# Patient Record
Sex: Female | Born: 1950
Health system: Southern US, Community
[De-identification: ages and names within clinical notes are randomized; demographics above are authoritative.]

## PROBLEM LIST (undated history)

## (undated) DIAGNOSIS — Z9221 Personal history of antineoplastic chemotherapy: Secondary | ICD-10-CM

## (undated) DIAGNOSIS — M722 Plantar fascial fibromatosis: Secondary | ICD-10-CM

## (undated) DIAGNOSIS — R112 Nausea with vomiting, unspecified: Secondary | ICD-10-CM

## (undated) DIAGNOSIS — K219 Gastro-esophageal reflux disease without esophagitis: Secondary | ICD-10-CM

## (undated) DIAGNOSIS — N3281 Overactive bladder: Secondary | ICD-10-CM

## (undated) DIAGNOSIS — Z923 Personal history of irradiation: Secondary | ICD-10-CM

## (undated) DIAGNOSIS — Z9889 Other specified postprocedural states: Secondary | ICD-10-CM

## (undated) DIAGNOSIS — N393 Stress incontinence (female) (male): Secondary | ICD-10-CM

## (undated) DIAGNOSIS — N819 Female genital prolapse, unspecified: Secondary | ICD-10-CM

## (undated) DIAGNOSIS — Z960 Presence of urogenital implants: Secondary | ICD-10-CM

## (undated) DIAGNOSIS — C801 Malignant (primary) neoplasm, unspecified: Secondary | ICD-10-CM

## (undated) HISTORY — PX: HERNIA REPAIR: SHX51

## (undated) HISTORY — DX: Malignant (primary) neoplasm, unspecified: C80.1

## (undated) HISTORY — DX: Plantar fascial fibromatosis: M72.2

---

## 1971-09-21 HISTORY — PX: INGUINAL HERNIA REPAIR: SUR1180

## 1999-07-22 ENCOUNTER — Other Ambulatory Visit: Admission: RE | Admit: 1999-07-22 | Discharge: 1999-07-22 | Payer: Self-pay | Admitting: *Deleted

## 1999-10-06 ENCOUNTER — Other Ambulatory Visit: Admission: RE | Admit: 1999-10-06 | Discharge: 1999-10-06 | Payer: Self-pay | Admitting: *Deleted

## 2000-08-10 ENCOUNTER — Other Ambulatory Visit: Admission: RE | Admit: 2000-08-10 | Discharge: 2000-08-10 | Payer: Self-pay | Admitting: *Deleted

## 2001-09-15 ENCOUNTER — Other Ambulatory Visit: Admission: RE | Admit: 2001-09-15 | Discharge: 2001-09-15 | Payer: Self-pay | Admitting: *Deleted

## 2002-09-25 ENCOUNTER — Other Ambulatory Visit: Admission: RE | Admit: 2002-09-25 | Discharge: 2002-09-25 | Payer: Self-pay | Admitting: *Deleted

## 2013-03-01 ENCOUNTER — Other Ambulatory Visit: Payer: Self-pay | Admitting: Family Medicine

## 2013-03-01 ENCOUNTER — Other Ambulatory Visit (HOSPITAL_COMMUNITY)
Admission: RE | Admit: 2013-03-01 | Discharge: 2013-03-01 | Disposition: A | Discharge status: Home / Self Care | Payer: 59 | Source: Ambulatory Visit | Attending: Family Medicine | Admitting: Family Medicine

## 2013-03-01 DIAGNOSIS — Z124 Encounter for screening for malignant neoplasm of cervix: Secondary | ICD-10-CM | POA: Insufficient documentation

## 2017-10-10 DIAGNOSIS — K219 Gastro-esophageal reflux disease without esophagitis: Secondary | ICD-10-CM | POA: Insufficient documentation

## 2019-07-22 DIAGNOSIS — Z170 Estrogen receptor positive status [ER+]: Secondary | ICD-10-CM

## 2019-07-22 HISTORY — DX: Estrogen receptor positive status (ER+): Z17.0

## 2019-08-08 ENCOUNTER — Other Ambulatory Visit: Payer: Self-pay | Admitting: Radiology

## 2019-08-10 ENCOUNTER — Telehealth: Payer: Self-pay | Admitting: Oncology

## 2019-08-10 NOTE — Telephone Encounter (Signed)
Spoke with patient to confirm afternoon [facility] appointment on 12/2, packet emailed to patient

## 2019-08-17 ENCOUNTER — Other Ambulatory Visit: Payer: Self-pay | Admitting: *Deleted

## 2019-08-17 DIAGNOSIS — C50211 Malignant neoplasm of upper-inner quadrant of right female breast: Secondary | ICD-10-CM | POA: Insufficient documentation

## 2019-08-17 DIAGNOSIS — Z170 Estrogen receptor positive status [ER+]: Secondary | ICD-10-CM | POA: Insufficient documentation

## 2019-08-21 NOTE — Progress Notes (Signed)
[facility] CONSULT NOTE  Patient Care Team: Patient, No Pcp Per as PCP - General (General Practice) Martini, Keisha N, RN as Oncology Nurse Navigator Stuart, Dawn C, RN as Oncology Nurse Navigator Gudena, Vinay, MD as Consulting Physician (Hematology and Oncology) Moody, John, MD as Consulting Physician (Radiation Oncology) [name], Faera, MD as Consulting Physician (General Surgery) Moody, John, MD as Consulting Physician (Radiation Oncology)  CHIEF COMPLAINTS/PURPOSE OF CONSULTATION:  Newly diagnosed breast cancer  HISTORY OF PRESENTING ILLNESS:  Amy Harrington 68 y.o. female is here because of recent diagnosis of invasive mammary carcinoma of the right breast. The cancer was detected on a routine screening mammogram on 07/17/19 that showed a 0.8cm indeterminate mass in the right breast. US on 07/26/19 showed a 0.9cm mass in the right breast highly suggestive of malignancy. Biopsy on 08/08/19 showed invasive mammary carcinoma, grade 2, HER-2 positive by FISH, ER+ 100%, PR+ 70%, Ki67 15%. She presents to the clinic today for initial evaluation and discussion of treatment options.   I reviewed her records extensively and collaborated the history with the patient.  SUMMARY OF ONCOLOGIC HISTORY: Oncology History  Malignant neoplasm of 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, estrogen receptor positive (HCC)  08/17/2019 Initial Diagnosis   Routine screening mammogram detected a 0.8cm indeterminate mass in the right breast. Biopsy showed invasive ductal carcinoma, grade 2, HER-2 + by FISH, ER+ 100%, PR+ 70%, Ki67 15%.    08/22/2019 Cancer Staging   Staging form: Breast, AJCC 8th Edition - Clinical stage from 08/22/2019: Stage IA (cT1b, cN0, cM0, G2, ER+, PR+, HER2+) - Signed by Gudena, Vinay, MD on 08/22/2019     MEDICAL HISTORY:  Past Medical History:  Diagnosis Date  . Plantar fasciitis     SURGICAL HISTORY: No prior surgeries SOCIAL HISTORY: Social History    Socioeconomic History  . Marital status: Married    Spouse name: Not on file  . Number of children: Not on file  . Years of education: Not on file  . Highest education level: Not on file  Occupational History  . Not on file  Social Needs  . Financial resource strain: Not on file  . Food insecurity    Worry: Not on file    Inability: Not on file  . Transportation needs    Medical: Not on file    Non-medical: Not on file  Tobacco Use  . Smoking status: Never Smoker  Substance and Sexual Activity  . Alcohol use: Not Currently  . Drug use: Never  . Sexual activity: Not on file  Lifestyle  . Physical activity    Days per week: Not on file    Minutes per session: Not on file  . Stress: Not on file  Relationships  . Social connections    Talks on phone: Not on file    Gets together: Not on file    Attends religious service: Not on file    Active member of club or organization: Not on file    Attends meetings of clubs or organizations: Not on file    Relationship status: Not on file  . Intimate partner violence    Fear of current or ex partner: Not on file    Emotionally abused: Not on file    Physically abused: Not on file    Forced sexual activity: Not on file  Other Topics Concern  . Not on file  Social History Narrative  . Not on file    FAMILY HISTORY: Family   History  Problem Relation Age of Onset  . Stomach cancer Father 49    ALLERGIES:  has No Known Allergies.  MEDICATIONS:  Current Outpatient Medications  Medication Sig Dispense Refill  . esomeprazole (NEXIUM) 20 MG capsule Take 20 mg by mouth daily at 12 noon.    . Multiple Vitamin (MULTIVITAMIN) tablet Take 1 tablet by mouth daily.     No current facility-administered medications for this visit.     REVIEW OF SYSTEMS:   Constitutional: Denies fevers, chills or abnormal night sweats Eyes: Denies blurriness of vision, double vision or watery eyes Ears, nose, mouth, throat, and face: Denies mucositis  or sore throat Respiratory: Denies cough, dyspnea or wheezes Cardiovascular: Denies palpitation, chest discomfort or lower extremity swelling Gastrointestinal:  Denies nausea, heartburn or change in bowel habits Skin: Denies abnormal skin rashes Lymphatics: Denies new lymphadenopathy or easy bruising Neurological:Denies numbness, tingling or new weaknesses Behavioral/Psych: Mood is stable, no new changes  Breast: Denies any palpable lumps or discharge All other systems were reviewed with the patient and are negative.  PHYSICAL EXAMINATION: ECOG PERFORMANCE STATUS: 1 - Symptomatic but completely ambulatory  Vitals:   08/22/19 0855  BP: (!) 132/54  Pulse: 73  Resp: 18  Temp: (!) 97.1 F (36.2 C)  SpO2: 99%   Filed Weights   08/22/19 0855  Weight: 188 lb 8 oz (85.5 kg)    GENERAL:alert, no distress and comfortable SKIN: skin color, texture, turgor are normal, no rashes or significant lesions EYES: normal, conjunctiva are pink and non-injected, sclera clear OROPHARYNX:no exudate, no erythema and lips, buccal mucosa, and tongue normal  NECK: supple, thyroid normal size, non-tender, without nodularity LYMPH:  no palpable lymphadenopathy in the cervical, axillary or inguinal LUNGS: clear to auscultation and percussion with normal breathing effort HEART: regular rate & rhythm and no murmurs and no lower extremity edema ABDOMEN:abdomen soft, non-tender and normal bowel sounds Musculoskeletal:no cyanosis of digits and no clubbing  PSYCH: alert & oriented x 3 with fluent speech NEURO: no focal motor/sensory deficits BREAST: No palpable nodules in breast. No palpable axillary or supraclavicular lymphadenopathy (exam performed in the presence of a chaperone)   LABORATORY DATA:  I have reviewed the data as listed Lab Results  Component Value Date   WBC 5.8 08/22/2019   HGB 14.9 08/22/2019   HCT 44.9 08/22/2019   MCV 91.8 08/22/2019   PLT 208 08/22/2019   Lab Results  Component  Value Date   NA 143 08/22/2019   K 4.3 08/22/2019   CL 109 08/22/2019   CO2 24 08/22/2019    RADIOGRAPHIC STUDIES: I have personally reviewed the radiological reports and agreed with the findings in the report.  ASSESSMENT AND PLAN:  Malignant neoplasm of 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, estrogen receptor positive (HCC) 08/17/2019:Routine screening mammogram detected a 0.8cm indeterminate mass in the right breast. Biopsy showed invasive ductal carcinoma, grade 2, HER-2 + by FISH, ER+ 100%, PR+ 70%, Ki67 15%.  T1 BN 0 stage Ia clinical stage  Pathology and radiology counseling: Discussed with the patient, the details of pathology including the type of breast cancer,the clinical staging, the significance of ER, PR and HER-2/neu receptors and the implications for treatment. After reviewing the pathology in detail, we proceeded to discuss the different treatment options between surgery, radiation, chemotherapy, antiestrogen therapies.  Treatment plan: 1.  Breast conserving surgery with sentinel lymph node biopsy 2.  Adjuvant chemotherapy with Taxol [name] weekly x12 followed by [name] maintenance for 1 year 3.    Follow-up adjuvant radiation 4.  Follow-up adjuvant antiestrogen therapy  Chemotherapy Counseling: I discussed the risks and benefits of chemotherapy including the risks of nausea/ vomiting, risk of infection from low WBC count, fatigue due to chemo or anemia, bruising or bleeding due to low platelets, mouth sores, loss/ change in taste and decreased appetite. Liver and kidney function will be monitored through out chemotherapy as abnormalities in liver and kidney function may be a side effect of treatment. Cardiac dysfunction due to  [name] and neuropathy due to Taxol were discussed in detail. Risk of permanent bone marrow dysfunction and leukemia due to chemo were also discussed.  Return to clinic after surgery to discuss starting chemotherapy.     All  questions were answered. The patient knows to call the clinic with any problems, questions or concerns.   Rulon Eisenmenger, MD, MPH 08/22/2019    I, Molly Dorshimer, am acting as scribe for Nicholas Lose, MD.  I have reviewed the above documentation for accuracy and completeness, and I agree with the above.

## 2019-08-22 ENCOUNTER — Inpatient Hospital Stay: Payer: Medicare Other

## 2019-08-22 ENCOUNTER — Encounter: Payer: Self-pay | Admitting: Physical Therapy

## 2019-08-22 ENCOUNTER — Encounter: Payer: Self-pay | Admitting: Hematology and Oncology

## 2019-08-22 ENCOUNTER — Other Ambulatory Visit: Payer: Self-pay

## 2019-08-22 ENCOUNTER — Inpatient Hospital Stay: Payer: Medicare Other | Attending: Hematology and Oncology | Admitting: Hematology and Oncology

## 2019-08-22 ENCOUNTER — Other Ambulatory Visit: Payer: Self-pay | Admitting: General Surgery

## 2019-08-22 ENCOUNTER — Ambulatory Visit: Payer: Medicare Other | Attending: General Surgery | Admitting: Physical Therapy

## 2019-08-22 ENCOUNTER — Ambulatory Visit
Admission: RE | Admit: 2019-08-22 | Discharge: 2019-08-22 | Disposition: A | Discharge status: Home / Self Care | Payer: Medicare Other | Source: Ambulatory Visit | Attending: Radiation Oncology | Admitting: Radiation Oncology

## 2019-08-22 VITALS — BP 132/54 | HR 73 | Temp 97.1°F | Resp 18 | Ht 63.0 in | Wt 188.5 lb

## 2019-08-22 DIAGNOSIS — Z23 Encounter for immunization: Secondary | ICD-10-CM | POA: Diagnosis not present

## 2019-08-22 DIAGNOSIS — Z170 Estrogen receptor positive status [ER+]: Secondary | ICD-10-CM

## 2019-08-22 DIAGNOSIS — C50211 Malignant neoplasm of upper-inner quadrant of right female breast: Secondary | ICD-10-CM | POA: Diagnosis present

## 2019-08-22 DIAGNOSIS — R293 Abnormal posture: Secondary | ICD-10-CM | POA: Insufficient documentation

## 2019-08-22 DIAGNOSIS — Z0000 Encounter for general adult medical examination without abnormal findings: Secondary | ICD-10-CM

## 2019-08-22 LAB — CBC WITH DIFFERENTIAL (CANCER CENTER ONLY)
Abs Immature Granulocytes: 0.02 10*3/uL (ref 0.00–0.07)
Basophils Absolute: 0 10*3/uL (ref 0.0–0.1)
Basophils Relative: 1 %
Eosinophils Absolute: 0.1 10*3/uL (ref 0.0–0.5)
Eosinophils Relative: 1 %
HCT: 44.9 % (ref 36.0–46.0)
Hemoglobin: 14.9 g/dL (ref 12.0–15.0)
Immature Granulocytes: 0 %
Lymphocytes Relative: 28 %
Lymphs Abs: 1.6 10*3/uL (ref 0.7–4.0)
MCH: 30.5 pg (ref 26.0–34.0)
MCHC: 33.2 g/dL (ref 30.0–36.0)
MCV: 91.8 fL (ref 80.0–100.0)
Monocytes Absolute: 0.4 10*3/uL (ref 0.1–1.0)
Monocytes Relative: 8 %
Neutro Abs: 3.6 10*3/uL (ref 1.7–7.7)
Neutrophils Relative %: 62 %
Platelet Count: 208 10*3/uL (ref 150–400)
RBC: 4.89 MIL/uL (ref 3.87–5.11)
RDW: 13.4 % (ref 11.5–15.5)
WBC Count: 5.8 10*3/uL (ref 4.0–10.5)
nRBC: 0 % (ref 0.0–0.2)

## 2019-08-22 LAB — CMP (CANCER CENTER ONLY)
ALT: 25 U/L (ref 0–44)
AST: 25 U/L (ref 15–41)
Albumin: 4 g/dL (ref 3.5–5.0)
Alkaline Phosphatase: 67 U/L (ref 38–126)
Anion gap: 10 (ref 5–15)
BUN: 13 mg/dL (ref 8–23)
CO2: 24 mmol/L (ref 22–32)
Calcium: 9.5 mg/dL (ref 8.9–10.3)
Chloride: 109 mmol/L (ref 98–111)
Creatinine: 0.78 mg/dL (ref 0.44–1.00)
GFR, Est AFR Am: >60 mL/min (ref >60)
GFR, Estimated: >60 mL/min (ref >60)
Glucose, Bld: 96 mg/dL (ref 70–99)
Potassium: 4.3 mmol/L (ref 3.5–5.1)
Sodium: 143 mmol/L (ref 135–145)
Total Bilirubin: 0.4 mg/dL (ref 0.3–1.2)
Total Protein: 6.6 g/dL (ref 6.5–8.1)

## 2019-08-22 LAB — GENETIC SCREENING ORDER

## 2019-08-22 MED ORDER — INFLUENZA VAC A&B SA ADJ QUAD 0.5 ML IM PRSY
PREFILLED_SYRINGE | INTRAMUSCULAR | Status: AC
  Filled 2019-08-22: qty 0.5

## 2019-08-22 MED ORDER — INFLUENZA VAC A&B SA ADJ QUAD 0.5 ML IM PRSY
0.5000 mL | PREFILLED_SYRINGE | Freq: Once | INTRAMUSCULAR | Status: AC
  Administered 2019-08-22: 0.5 mL via INTRAMUSCULAR

## 2019-08-22 NOTE — Patient Instructions (Signed)

## 2019-08-22 NOTE — Addendum Note (Signed)
Addended by: Rennis Harding on: 08/22/2019 11:55 AM   Modules accepted: Orders

## 2019-08-22 NOTE — Therapy (Signed)
[place], [place], [address] Phone: [phone]   Fax:  [phone]  Physical Therapy Evaluation  Patient Details  Name: Amy Harrington MRN: [id number] Date of Birth: Oct 09, 1950 Referring Provider (PT): Dr. Stark Klein   Encounter Date: 08/22/2019  PT End of Session - 08/22/19 1058    Visit Number  1    Number of Visits  2    Date for PT Re-Evaluation  10/17/19    PT Start Time  1025    PT Stop Time  7096   Also saw pt from 4383 to 1140 for a total of 27 minutes   PT Time Calculation (min)  15 min    Activity Tolerance  Patient tolerated treatment well    Behavior During Therapy  [facility] for tasks assessed/performed       Past Medical History:  Diagnosis Date  . Plantar fasciitis     Past Surgical History:  Procedure Laterality Date  . HERNIA REPAIR      There were no vitals filed for this visit.   Subjective Assessment - 08/22/19 1048    Subjective  Patient reports she is here today to be seen by her medical team for her newly diagnosed right breast cancer.    Patient is accompained by:  Family member    Pertinent History  Patient was diagnosed on 07/17/2019 with right grade II triple positive invasive ductal carcinoma breast cancer. It measures 8 mm and is located in the upper inner quadrant. The Ki67 is 15%.    Patient Stated Goals  Reduce lymphedema risk and learn post op shoulder ROM HEP    Currently in Pain?  No/denies         [facility] PT Assessment - 08/22/19 0001      Assessment   Medical Diagnosis  Right breast cancer    Referring Provider (PT)  Dr. Stark Klein    Onset Date/Surgical Date  07/17/19    Hand Dominance  Right    Prior Therapy  none      Precautions   Precautions  Other (comment)    Precaution Comments  active cancer      Restrictions   Weight Bearing Restrictions  No      Balance Screen   Has the patient fallen in the past 6 months  Yes    How many times?   1   Pt fell off of a bike when she was getting on   Has the patient had a decrease in activity level because of a fear of falling?   No    Is the patient reluctant to leave their home because of a fear of falling?   No      Home Environment   Living Environment  Private residence    Living Arrangements  Alone    Available Help at Discharge  Family      Prior Function   Level of Independence  Independent    Vocation  Full time employment    Vocation Requirements  Runs an Chief Financial Officer business; mostly desk work    Leisure  She does not exercise but tried to achieve 10,000 steps per day      Cognition   Overall Cognitive Status  Within Functional Limits for tasks assessed      Posture/Postural Control   Posture/Postural Control  Postural limitations    Postural Limitations  Rounded Shoulders;Forward head      ROM / Strength  AROM / PROM / Strength  AROM;Strength      AROM   Overall AROM Comments  Cervical AROM is WNL    AROM Assessment Site  Shoulder    Right/Left Shoulder  Right;Left    Right Shoulder Extension  51 Degrees    Right Shoulder Flexion  132 Degrees    Right Shoulder ABduction  138 Degrees    Right Shoulder Internal Rotation  59 Degrees    Right Shoulder External Rotation  76 Degrees    Left Shoulder Extension  53 Degrees    Left Shoulder Flexion  137 Degrees    Left Shoulder ABduction  130 Degrees    Left Shoulder Internal Rotation  53 Degrees    Left Shoulder External Rotation  87 Degrees      Strength   Overall Strength  Within functional limits for tasks performed        LYMPHEDEMA/ONCOLOGY QUESTIONNAIRE - 08/22/19 1055      Type   Cancer Type  Right breast cancer      Lymphedema Assessments   Lymphedema Assessments  Upper extremities      Right Upper Extremity Lymphedema   10 cm Proximal to Olecranon Process  27.7 cm    Olecranon Process  24.5 cm    10 cm Proximal to Ulnar Styloid Process  22.8 cm    Just Proximal to Ulnar Styloid  Process  15.8 cm    Across Hand at [employer]  18.5 cm    At [place] of 2nd Digit  6.6 cm      Left Upper Extremity Lymphedema   10 cm Proximal to Olecranon Process  28.9 cm    Olecranon Process  25.8 cm    10 cm Proximal to Ulnar Styloid Process  22.7 cm    Just Proximal to Ulnar Styloid Process  17 cm    Across Hand at [employer]  18.3 cm    At [place] of 2nd Digit  6.3 cm          Quick Dash - 08/22/19 0001    Open a tight or new jar  Mild difficulty    Do heavy household chores (wash walls, wash floors)  No difficulty    Carry a shopping bag or briefcase  No difficulty    Wash your back  No difficulty    Use a knife to cut food  No difficulty    Recreational activities in which you take some force or impact through your arm, shoulder, or hand (golf, hammering, tennis)  No difficulty    During the past week, to what extent has your arm, shoulder or hand problem interfered with your normal social activities with family, friends, neighbors, or groups?  Not at all    During the past week, to what extent has your arm, shoulder or hand problem limited your work or other regular daily activities  Not at all    Arm, shoulder, or hand pain.  None    Tingling (pins and needles) in your arm, shoulder, or hand  None    Difficulty Sleeping  No difficulty    DASH Score  2.27 %        Objective measurements completed on examination: See above findings.     Patient was instructed today in a home exercise program today for post op shoulder range of motion. These included active assist shoulder flexion in sitting, scapular retraction, wall walking with shoulder abduction, and hands behind head external rotation.  She was encouraged to do these twice a day, holding 3 seconds and repeating 5 times when permitted by her physician.         PT Education - 08/22/19 1056    Education Details  Lymphedema risk reduction and post op shoulder ROM HEP    Person(s) Educated   Patient;Child(ren)    Methods  Explanation;Demonstration;Handout    Comprehension  Returned demonstration;Verbalized understanding          PT Long Term Goals - 08/22/19 1150      PT LONG TERM GOAL #1   Title  Patient will demonstrate she has regained full shoulder ROM and function post operatively compared ot baselines.    Time  8    Period  Weeks    Status  New    Target Date  10/17/19      Breast Clinic Goals - 08/22/19 1149      Patient will be able to verbalize understanding of pertinent lymphedema risk reduction practices relevant to her diagnosis specifically related to skin care.   Time  1    Period  Days    Status  Achieved      Patient will be able to return demonstrate and/or verbalize understanding of the post-op home exercise program related to regaining shoulder range of motion.   Time  1    Period  Days    Status  Achieved      Patient will be able to verbalize understanding of the importance of attending the postoperative After Breast Cancer Class for further lymphedema risk reduction education and therapeutic exercise.   Time  1    Period  Days    Status  Achieved            Plan - 08/22/19 1059    Clinical Impression Statement  Patient was diagnosed on 07/17/2019 with right grade II triple positive invasive ductal carcinoma breast cancer. It measures 8 mm and is located in the upper inner quadrant. The Ki67 is 15%. Her multidisciplinary medical team met prior to her assessments to determine a recommended treatment plan. She is planning to have a right lumpectomy and sentinel node biopsy followed by chemotherapy, radiation, and anti-estogen therapy. She will benefit from a post op PT visit to reassess and determine needs.    Stability/Clinical Decision Making  Stable/Uncomplicated    Clinical Decision Making  Low    Rehab Potential  Excellent    PT Frequency  --   Eval and 1 f/u visit   PT Treatment/Interventions  ADLs/Self Care Home  Management;Therapeutic exercise;Patient/family education    PT Next Visit Plan  Will reassess 3-4 eeks post op to determine needs    PT Home Exercise Plan  Post op shoulder ROM HEP    Consulted and Agree with Plan of Care  Patient;Family member/caregiver    Family Member Consulted  daughter       Patient will benefit from skilled therapeutic intervention in order to improve the following deficits and impairments:  Postural dysfunction, Decreased range of motion, Decreased knowledge of precautions, Impaired UE functional use, Pain  Visit Diagnosis: Malignant neoplasm of 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, estrogen receptor positive ([place]) - Plan: PT plan of care cert/re-cert  Abnormal posture - Plan: PT plan of care cert/re-cert   Patient will follow up at outpatient cancer rehab 3-4 weeks following surgery.  If the patient requires physical therapy at that time, a specific plan will be dictated and sent to the referring  physician for approval. The patient was educated today on appropriate basic range of motion exercises to begin post operatively and the importance of attending the After Breast Cancer class following surgery.  Patient was educated today on lymphedema risk reduction practices as it pertains to recommendations that will benefit the patient immediately following surgery.  She verbalized good understanding.      Problem List Patient Active Problem List   Diagnosis Date Noted  . Malignant neoplasm of 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, estrogen receptor positive ([place]) 08/17/2019   Annia Friendly, PT 08/22/19 11:53 AM  [place], [place], [address] Phone: [phone]   Fax:  [phone]  Name: GRACEANNA THEISSEN MRN: [id number] Date of Birth: 02/12/1951

## 2019-08-22 NOTE — Progress Notes (Signed)
Radiation Oncology         [phone] ________________________________  Name: Amy Harrington        MRN: [id number]  Date of Service: 08/22/2019 DOB: 1951-04-26  ON:[id number], No Pcp Per  Stark Klein, MD     REFERRING PHYSICIAN: Stark Klein, MD   DIAGNOSIS: The encounter diagnosis was Malignant neoplasm of 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, estrogen receptor positive ([place]).   HISTORY OF PRESENT ILLNESS: Amy Harrington is a 68 y.o. female seen in the multidisciplinary breast clinic for a new diagnosis of right breast cancer. The patient was noted to have a screening detected mass in the right breast.  On mammography this was found to measure approximately 8 mm in the 3 o'clock position.  She proceeded with diagnostic imaging and ultrasound revealed a 9 mm mass at 3:00.  Her axilla was negative for adenopathy.  She underwent a biopsy on 08/08/2019 which revealed a grade 2 invasive ductal carcinoma, her tumor was triple positive with a Ki-67 of 15%.  She is seen today to discuss treatment recommendations for her cancer.   PREVIOUS RADIATION THERAPY: No   PAST MEDICAL HISTORY:  Past Medical History:  Diagnosis Date  . Plantar fasciitis        PAST SURGICAL HISTORY: Past Surgical History:  Procedure Laterality Date  . HERNIA REPAIR       FAMILY HISTORY:  Family History  Problem Relation Age of Onset  . Stomach cancer Father 44     SOCIAL HISTORY:  reports that she has never smoked. She does not have any smokeless tobacco history on file. She reports previous alcohol use. She reports that she does not use drugs.  The patient is widowed and lives in [place]. She is accompanied by her daughter Eustaquio Maize. They run an Warehouse manager company.   ALLERGIES: Patient has no known allergies.   MEDICATIONS:  Current Outpatient Medications  Medication Sig Dispense Refill  . esomeprazole (NEXIUM) 20 MG capsule Take 20 mg by mouth daily at 12 noon.    . Multiple  Vitamin (MULTIVITAMIN) tablet Take 1 tablet by mouth daily.     No current facility-administered medications for this encounter.      REVIEW OF SYSTEMS: On review of systems, the patient reports that she is doing well overall. She is worried about how she will appear with chemotherapy side effects to her 50 year old granddaughter who lost her mother at 63 unexpectedly. She denies any chest pain, shortness of breath, cough, fevers, chills, night sweats, unintended weight changes. She denies any bowel or bladder disturbances, and denies abdominal pain, nausea or vomiting. She denies any new musculoskeletal or joint aches or pains. A complete review of systems is obtained and is otherwise negative.     PHYSICAL EXAM:  Wt Readings from Last 3 Encounters:  08/22/19 188 lb 8 oz (85.5 kg)   Temp Readings from Last 3 Encounters:  08/22/19 (!) 97.1 F (36.2 C) (Temporal)   BP Readings from Last 3 Encounters:  08/22/19 (!) 132/54   Pulse Readings from Last 3 Encounters:  08/22/19 73    In general this is a well appearing caucasian female in no acute distress. She's alert and oriented x4 and appropriate throughout the examination. Cardiopulmonary assessment is negative for acute distress and she exhibits normal effort.  Bilateral breast exam is deferred.    ECOG = 0  0 - Asymptomatic (Fully active, able to carry on all predisease activities without restriction)  1 -  Symptomatic but completely ambulatory (Restricted in physically strenuous activity but ambulatory and able to carry out work of a light or sedentary nature. For example, light housework, office work)  2 - Symptomatic, <50% in bed during the day (Ambulatory and capable of all self care but unable to carry out any work activities. Up and about more than 50% of waking hours)  3 - Symptomatic, >50% in bed, but not bedbound (Capable of only limited self-care, confined to bed or chair 50% or more of waking hours)  4 - Bedbound  (Completely disabled. Cannot carry on any self-care. Totally confined to bed or chair)  5 - Death   Eustace Pen MM, Creech RH, Tormey DC, et al. [phone]). "Toxicity and response criteria of the [facility]". [place] Oncol. 5 (6): 649-55    LABORATORY DATA:  Lab Results  Component Value Date   WBC 5.8 08/22/2019   HGB 14.9 08/22/2019   HCT 44.9 08/22/2019   MCV 91.8 08/22/2019   PLT 208 08/22/2019   Lab Results  Component Value Date   NA 143 08/22/2019   K 4.3 08/22/2019   CL 109 08/22/2019   CO2 24 08/22/2019   Lab Results  Component Value Date   ALT 25 08/22/2019   AST 25 08/22/2019   ALKPHOS 67 08/22/2019   BILITOT 0.4 08/22/2019      RADIOGRAPHY: No results found.     IMPRESSION/PLAN: 1. Stage IA, cT1bN0M0 grade 2 triple positive invasive ductal carcinoma of the right breast. Dr. Lisbeth Renshaw discusses the pathology findings and reviews the nature of triple positive breast disease. The consensus from the breast conference includes breast conservation with lumpectomy with  sentinel node biopsy. She was offered adjuvant chemotherapy because of the HER2 amplification of her disease. She would also benefit from adjuvant radiotherapy to the right breast for curative intent as this would reduce the risks of local recurrence. She would also benefit from antiestrogen therapy as well. We discussed the risks, benefits, short, and long term effects of radiotherapy, and the patient is interested in proceeding. Dr. Lisbeth Renshaw discusses the delivery and logistics of radiotherapy and anticipates a course of 4-6 1/2 weeks of radiotherapy. We will see her back about 3 weeks after completing systemic chemotherapy to consider moving forward with radiotherapy. 2. Social work needs. The patient would benefit from social work consultation to discuss how to discuss her own diagnosis with her young granddaughter who has already experienced great loss of a parent. We will follow this  expectantly.   In a visit lasting 45 minutes, greater than 50% of the time was spent face to face discussing her case, and coordinating the patient's care.  The above documentation reflects my direct findings during this shared patient visit. Please see the separate note by Dr. Lisbeth Renshaw on this date for the remainder of the patient's plan of care.    Carola Rhine, PAC

## 2019-08-22 NOTE — Assessment & Plan Note (Addendum)
08/17/2019:Routine screening mammogram detected a 0.8cm indeterminate mass in the right breast. Biopsy showed invasive ductal carcinoma, grade 2, HER-2 + by FISH, ER+ 100%, PR+ 70%, Ki67 15%.  T1 BN 0 stage Ia clinical stage  Pathology and radiology counseling: Discussed with the patient, the details of pathology including the type of breast cancer,the clinical staging, the significance of ER, PR and HER-2/neu receptors and the implications for treatment. After reviewing the pathology in detail, we proceeded to discuss the different treatment options between surgery, radiation, chemotherapy, antiestrogen therapies.  Treatment plan: 1.  Breast conserving surgery with sentinel lymph node biopsy 2.  Adjuvant chemotherapy with Taxol [name] weekly x12 followed by [name] maintenance for 1 year 3.  Follow-up adjuvant radiation 4.  Follow-up adjuvant antiestrogen therapy  Chemotherapy Counseling: I discussed the risks and benefits of chemotherapy including the risks of nausea/ vomiting, risk of infection from low WBC count, fatigue due to chemo or anemia, bruising or bleeding due to low platelets, mouth sores, loss/ change in taste and decreased appetite. Liver and kidney function will be monitored through out chemotherapy as abnormalities in liver and kidney function may be a side effect of treatment. Cardiac dysfunction due to  [name] and neuropathy due to Taxol were discussed in detail. Risk of permanent bone marrow dysfunction and leukemia due to chemo were also discussed.  Return to clinic after surgery to discuss starting chemotherapy.   

## 2019-08-24 ENCOUNTER — Ambulatory Visit (HOSPITAL_COMMUNITY)
Admission: RE | Admit: 2019-08-24 | Discharge: 2019-08-24 | Disposition: A | Discharge status: Home / Self Care | Payer: Medicare Other | Source: Ambulatory Visit | Attending: Hematology and Oncology | Admitting: Hematology and Oncology

## 2019-08-24 ENCOUNTER — Other Ambulatory Visit: Payer: Self-pay

## 2019-08-24 ENCOUNTER — Other Ambulatory Visit: Payer: Self-pay | Admitting: General Surgery

## 2019-08-24 DIAGNOSIS — Z170 Estrogen receptor positive status [ER+]: Secondary | ICD-10-CM

## 2019-08-24 DIAGNOSIS — Z0189 Encounter for other specified special examinations: Secondary | ICD-10-CM

## 2019-08-24 DIAGNOSIS — C50211 Malignant neoplasm of upper-inner quadrant of right female breast: Secondary | ICD-10-CM | POA: Diagnosis not present

## 2019-08-24 DIAGNOSIS — Z79899 Other long term (current) drug therapy: Secondary | ICD-10-CM | POA: Diagnosis not present

## 2019-08-24 NOTE — Progress Notes (Signed)
  Echocardiogram 2D Echocardiogram has been performed.  Jannett Celestine 08/24/2019, 9:58 AM

## 2019-08-28 ENCOUNTER — Other Ambulatory Visit: Payer: Self-pay | Admitting: General Surgery

## 2019-08-28 DIAGNOSIS — C50211 Malignant neoplasm of upper-inner quadrant of right female breast: Secondary | ICD-10-CM

## 2019-08-28 DIAGNOSIS — Z170 Estrogen receptor positive status [ER+]: Secondary | ICD-10-CM

## 2019-08-29 ENCOUNTER — Telehealth: Payer: Self-pay | Admitting: Hematology and Oncology

## 2019-08-29 NOTE — H&P (Signed)
Amy Harrington Documented: 08/22/2019 7:38 AM Location: [facility] Patient #: [id number] DOB: 02/27/1951 Undefined / Language: Amy Harrington / Race: Refused to Report/Unreported Female   History of Present Illness Amy Klein MD; 08/22/2019 1:25 PM) The patient is a 68 year old female who presents with breast cancer. Pt is a 68 yo F who presents with new dx of right breast cancer 07/2019. She had a screening detected mass and subsequently underwent dx imaging. She was seen to have an 8 mm mass at 3 o'clock. This was evaluated with a core needle biopsy and was shown to be a grade 2 invasive ductal carcinoma, triple positive, ki 67 15%. She has no personal history of cancer, but her father had stomach cancer at age 19. She is a G2P2 with first child at age 86. She had menarche at age 35. She did not use HRT, but did use hormonal contraception for around 10 years.   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   Films are from [place]. reports and images reviewed.  pathology 08/08/2019 Breast, right, needle core biopsy, 3 o'clock, 13cmfn - INVASIVE MAMMARY CARCINOMA, GRADE II. Immunohistochemistry for E-Cadherin is strongly and diffusely positive consistent with invasive ductal carcinoma. Estrogen Receptor: 100%, POSITIVE, STRONG STAINING INTENSITY Progesterone Receptor: 70%, POSITIVE, STRONG STAINING INTENSITY Proliferation Marker Ki67: 15% GROUP 1: HER2 **POSITIVE**   labs 08/22/2019 CBC, CMET normal.     Past Surgical History Tawni Pummel, RN; 08/22/2019 7:38 AM) Ventral / Umbilical Hernia Surgery  Right.  Diagnostic Studies History Tawni Pummel, RN; 08/22/2019 7:38 AM) Colonoscopy  never Mammogram  within last year Pap Smear  >5 years ago  Medication History Tawni Pummel, RN; 08/22/2019 7:39 AM) Medications Reconciled  Social History Tawni Pummel, RN; 08/22/2019 7:38  AM) Caffeine use  Carbonated beverages. No alcohol use  No drug use  Tobacco use  Never smoker.  Family History Tawni Pummel, RN; 08/22/2019 7:38 AM) Cancer  Father. Migraine Headache  Brother.  Pregnancy / Birth History Tawni Pummel, RN; 08/22/2019 7:38 AM) Age at menarche  61 years. Age of menopause  67-50 Contraceptive History  Oral contraceptives. Gravida  2 Maternal age  16-25 Para  2 Regular periods   Other Problems Tawni Pummel, RN; 08/22/2019 7:38 AM) Breast Cancer  Gastroesophageal Reflux Disease     Review of Systems Tawni Pummel RN; 08/22/2019 7:38 AM) General Not Present- Appetite Loss, Chills, Fatigue, Fever, Night Sweats, Weight Gain and Weight Loss. Skin Not Present- Change in Wart/Mole, Dryness, Hives, Jaundice, New Lesions, Non-Healing Wounds, Rash and Ulcer. HEENT Present- Wears glasses/contact lenses. Not Present- Earache, Hearing Loss, Hoarseness, Nose Bleed, Oral Ulcers, Ringing in the Ears, Seasonal Allergies, Sinus Pain, Sore Throat, Visual Disturbances and Yellow Eyes. Respiratory Not Present- Bloody sputum, Chronic Cough, Difficulty Breathing, Snoring and Wheezing. Breast Not Present- Breast Mass, Breast Pain, Nipple Discharge and Skin Changes. Cardiovascular Not Present- Chest Pain, Difficulty Breathing Lying Down, Leg Cramps, Palpitations, Rapid Heart Rate, Shortness of Breath and Swelling of Extremities. Gastrointestinal Present- Nausea. Not Present- Abdominal Pain, Bloating, Bloody Stool, Change in Bowel Habits, Chronic diarrhea, Constipation, Difficulty Swallowing, Excessive gas, Gets full quickly at meals, Hemorrhoids, Indigestion, Rectal Pain and Vomiting. Female Genitourinary Not Present- Frequency, Nocturia, Painful Urination, Pelvic Pain and Urgency. Musculoskeletal Not Present- Back Pain, Joint Pain, Joint Stiffness, Muscle Pain, Muscle Weakness and Swelling of Extremities. Neurological Present- Headaches. Not Present-  Decreased Memory, Fainting, Numbness, Seizures, Tingling, Tremor, Trouble  walking and Weakness. Psychiatric Present- Depression. Not Present- Anxiety, Bipolar, Change in Sleep Pattern, Fearful and Frequent crying. Endocrine Not Present- Cold Intolerance, Excessive Hunger, Hair Changes, Heat Intolerance, Hot flashes and New Diabetes. Hematology Not Present- Blood Thinners, Easy Bruising, Excessive bleeding, Gland problems, HIV and Persistent Infections.  Vitals Amy Klein MD; 08/22/2019 1:12 PM) 08/22/2019 1:11 PM Weight: 188.5 lb Height: 63in Body Surface Area: 1.89 m Body Mass Index: 33.39 kg/m  Temp.: 97.79F  Pulse: 73 (Regular)  Resp.: 18 (Unlabored)  BP: 132/54 (Sitting, Left Arm, Standard)       Physical Exam Amy Klein MD; 08/22/2019 1:23 PM) General Mental Status-Alert. General Appearance-Consistent with stated age. Hydration-Well hydrated. Voice-Normal.  Head and Neck Head-normocephalic, atraumatic with no lesions or palpable masses. Trachea-midline. Thyroid Gland Characteristics - normal size and consistency.  Eye Eyeball - Bilateral-Extraocular movements intact. Sclera/Conjunctiva - Bilateral-No scleral icterus.  Chest and Lung Exam Chest and lung exam reveals -quiet, even and easy respiratory effort with no use of accessory muscles and on auscultation, normal breath sounds, no adventitious sounds and normal vocal resonance. Inspection Chest Wall - Normal. Back - normal.  Breast Note: breasts have grade 3 ptosis. they are relatively even. There is bruising that is healing on the left breast at the biopsy site. no palpable masses in either breast or LAD. She has no nipple retraction or skin dimpling.   Cardiovascular Cardiovascular examination reveals -normal heart sounds, regular rate and rhythm with no murmurs and normal pedal pulses bilaterally.  Abdomen Inspection Inspection of the abdomen reveals - No  Hernias. Palpation/Percussion Palpation and Percussion of the abdomen reveal - Soft, Non Tender, No Rebound tenderness, No Rigidity (guarding) and No hepatosplenomegaly. Auscultation Auscultation of the abdomen reveals - Bowel sounds normal.  Neurologic Neurologic evaluation reveals -alert and oriented x 3 with no impairment of recent or remote memory. Mental Status-Normal.  Musculoskeletal Global Assessment -Note: no gross deformities.  Normal Exam - Left-Upper Extremity Strength Normal and Lower Extremity Strength Normal. Normal Exam - Right-Upper Extremity Strength Normal and Lower Extremity Strength Normal.  Lymphatic Head & Neck  General Head & Neck Lymphatics: Bilateral - Description - Normal. Axillary  General Axillary Region: Bilateral - Description - Normal. Tenderness - Non Tender. Femoral & Inguinal  Generalized Femoral & Inguinal Lymphatics: Bilateral - Description - No Generalized lymphadenopathy.    Assessment & Plan Amy Klein MD; 08/22/2019 1:24 PM) MALIGNANT NEOPLASM OF UPPER-INNER QUADRANT OF RIGHT BREAST IN FEMALE, ESTROGEN RECEPTOR POSITIVE (C50.211) Impression: Pt has a new dx of right cT1bN0 cancer. This is triple positive, so she will need chemotherapy. We will plan a seed localized lumpectomy wtih sentinel lymph node biopsy and a port. This will be followed by radiation and antiestrogen tx.  The surgical procedure was described to the patient. I discussed the incision type and location and that we would need radiology involved on with a wire or seed marker and/or sentinel node.  The risks and benefits of the procedure were described to the patient and she wishes to proceed.  We discussed the risks bleeding, infection, damage to other structures, need for further procedures/surgeries. We discussed the risk of seroma. The patient was advised if the area in the breast in cancer, we may need to go back to surgery for additional tissue to obtain  negative margins or for a lymph node biopsy. The patient was advised that these are the most common complications, but that others can occur as well. They were advised against taking aspirin or other  anti-inflammatory agents/blood thinners the week before surgery. Current Plans You are being scheduled for surgery- Our schedulers will call you.  You should hear from our office's scheduling department within 5 working days about the location, date, and time of surgery. We try to make accommodations for patient's preferences in scheduling surgery, but sometimes the OR schedule or the surgeon's schedule prevents [place] from making those accommodations.  If you have not heard from our office [phone]) in 5 working days, call the office and ask for your surgeon's nurse.  If you have other questions about your diagnosis, plan, or surgery, call the office and ask for your surgeon's nurse.  Pt Education - flb breast cancer surgery: discussed with patient and provided information.   Signed electronically by Amy Klein, MD (08/22/2019 1:26 PM)

## 2019-08-29 NOTE — Telephone Encounter (Signed)
Scheduled appt per 12/8 sch message - unable to reach pt . Left message with appt date and time   

## 2019-08-30 ENCOUNTER — Telehealth: Payer: Self-pay | Admitting: *Deleted

## 2019-08-30 ENCOUNTER — Encounter: Payer: Self-pay | Admitting: *Deleted

## 2019-08-30 NOTE — Telephone Encounter (Signed)
Spoke with patient from [facility] to assess navigation needs.  No needs or concerns at this time.  Patient is aware of all her appointments. Encouraged her to call if anything arises.

## 2019-08-31 ENCOUNTER — Telehealth: Payer: Self-pay | Admitting: Hematology and Oncology

## 2019-08-31 NOTE — Telephone Encounter (Signed)
Scheduled appt per 12/10 sch message - pt is aware of appt date and time   

## 2019-09-03 ENCOUNTER — Other Ambulatory Visit: Payer: Self-pay

## 2019-09-03 ENCOUNTER — Encounter (HOSPITAL_BASED_OUTPATIENT_CLINIC_OR_DEPARTMENT_OTHER): Payer: Self-pay | Admitting: General Surgery

## 2019-09-04 ENCOUNTER — Other Ambulatory Visit (HOSPITAL_COMMUNITY)
Admission: RE | Admit: 2019-09-04 | Discharge: 2019-09-04 | Disposition: A | Discharge status: Home / Self Care | Payer: Medicare Other | Source: Ambulatory Visit | Attending: General Surgery | Admitting: General Surgery

## 2019-09-04 DIAGNOSIS — Z01812 Encounter for preprocedural laboratory examination: Secondary | ICD-10-CM | POA: Insufficient documentation

## 2019-09-04 DIAGNOSIS — Z20828 Contact with and (suspected) exposure to other viral communicable diseases: Secondary | ICD-10-CM | POA: Diagnosis not present

## 2019-09-04 NOTE — Progress Notes (Signed)

## 2019-09-05 ENCOUNTER — Telehealth: Payer: Self-pay

## 2019-09-05 LAB — NOVEL CORONAVIRUS, NAA (HOSP ORDER, SEND-OUT TO REF LAB; TAT 18-24 HRS): SARS-CoV-2, NAA: NOT DETECTED

## 2019-09-05 NOTE — Telephone Encounter (Signed)
Nutrition Assessment  Reason for Assessment:  Pt attended Breast Clinic on 08/22/2019 and was given nutrition packet by nurse navigator.  ASSESSMENT:  68 year old female with right breast cancer.  Planning lumpectomy on 12/18, followed by adjuvant chemotherapy, radiation and antiestrogens.  Past medical history reviewed.   Spoke with patient via phone to introduce self and service at [facility].  Patient reports that appetite is normal.   Medications:  reviewed  Labs: MVI  Anthropometrics:   Height: 63 inches Weight: 188 lb BMI: 33   NUTRITION DIAGNOSIS: Food and nutrition related knowledge deficit related to new diagnosis of breast cancer as evidenced by no prior need for nutrition related information.  INTERVENTION:   Discussed briefly packet of information regarding nutritional tips for breast cancer patients.  Questions answered. Contact information provided and patient knows to contact me with questions/concerns.    MONITORING, EVALUATION, and GOAL: Pt will consume a healthy plant based diet to maintain lean body mass throughout treatment.   Yahshua Thibault B. Zenia Resides, [place], [place] Registered Dietitian [phone] (pager)

## 2019-09-06 ENCOUNTER — Ambulatory Visit
Admission: RE | Admit: 2019-09-06 | Discharge: 2019-09-06 | Disposition: A | Discharge status: Home / Self Care | Payer: Medicare Other | Source: Ambulatory Visit | Attending: General Surgery | Admitting: General Surgery

## 2019-09-06 ENCOUNTER — Other Ambulatory Visit: Payer: Self-pay

## 2019-09-06 DIAGNOSIS — Z170 Estrogen receptor positive status [ER+]: Secondary | ICD-10-CM

## 2019-09-06 DIAGNOSIS — C50211 Malignant neoplasm of upper-inner quadrant of right female breast: Secondary | ICD-10-CM

## 2019-09-07 ENCOUNTER — Encounter (HOSPITAL_BASED_OUTPATIENT_CLINIC_OR_DEPARTMENT_OTHER)
Admission: RE | Disposition: A | Discharge status: Home / Self Care | Payer: Self-pay | Source: Home / Self Care | Attending: General Surgery

## 2019-09-07 ENCOUNTER — Ambulatory Visit (HOSPITAL_COMMUNITY)
Admission: RE | Admit: 2019-09-07 | Discharge: 2019-09-07 | Disposition: A | Discharge status: Home / Self Care | Payer: Medicare Other | Source: Ambulatory Visit | Attending: General Surgery | Admitting: General Surgery

## 2019-09-07 ENCOUNTER — Ambulatory Visit (HOSPITAL_BASED_OUTPATIENT_CLINIC_OR_DEPARTMENT_OTHER): Payer: Medicare Other | Admitting: Anesthesiology

## 2019-09-07 ENCOUNTER — Encounter (HOSPITAL_BASED_OUTPATIENT_CLINIC_OR_DEPARTMENT_OTHER): Payer: Self-pay | Admitting: General Surgery

## 2019-09-07 ENCOUNTER — Ambulatory Visit (HOSPITAL_COMMUNITY): Payer: Medicare Other

## 2019-09-07 ENCOUNTER — Ambulatory Visit
Admission: RE | Admit: 2019-09-07 | Discharge: 2019-09-07 | Disposition: A | Discharge status: Home / Self Care | Payer: Medicare Other | Source: Ambulatory Visit | Attending: General Surgery | Admitting: General Surgery

## 2019-09-07 ENCOUNTER — Other Ambulatory Visit: Payer: Self-pay

## 2019-09-07 ENCOUNTER — Ambulatory Visit (HOSPITAL_BASED_OUTPATIENT_CLINIC_OR_DEPARTMENT_OTHER)
Admission: RE | Admit: 2019-09-07 | Discharge: 2019-09-07 | Disposition: A | Discharge status: Home / Self Care | Payer: Medicare Other | Attending: General Surgery | Admitting: General Surgery

## 2019-09-07 DIAGNOSIS — Z6833 Body mass index (BMI) 33.0-33.9, adult: Secondary | ICD-10-CM | POA: Insufficient documentation

## 2019-09-07 DIAGNOSIS — Z95828 Presence of other vascular implants and grafts: Secondary | ICD-10-CM

## 2019-09-07 DIAGNOSIS — Z170 Estrogen receptor positive status [ER+]: Secondary | ICD-10-CM | POA: Diagnosis not present

## 2019-09-07 DIAGNOSIS — C50211 Malignant neoplasm of upper-inner quadrant of right female breast: Secondary | ICD-10-CM

## 2019-09-07 DIAGNOSIS — K219 Gastro-esophageal reflux disease without esophagitis: Secondary | ICD-10-CM | POA: Insufficient documentation

## 2019-09-07 DIAGNOSIS — E669 Obesity, unspecified: Secondary | ICD-10-CM | POA: Insufficient documentation

## 2019-09-07 HISTORY — PX: PORTACATH PLACEMENT: SHX2246

## 2019-09-07 HISTORY — DX: Other specified postprocedural states: R11.2

## 2019-09-07 HISTORY — PX: BREAST LUMPECTOMY WITH RADIOACTIVE SEED AND SENTINEL LYMPH NODE BIOPSY: SHX6550

## 2019-09-07 HISTORY — DX: Other specified postprocedural states: Z98.890

## 2019-09-07 HISTORY — DX: Gastro-esophageal reflux disease without esophagitis: K21.9

## 2019-09-07 SURGERY — BREAST LUMPECTOMY WITH RADIOACTIVE SEED AND SENTINEL LYMPH NODE BIOPSY
Anesthesia: General | Site: Chest | Laterality: Right

## 2019-09-07 MED ORDER — CEFAZOLIN SODIUM-DEXTROSE 2-4 GM/100ML-% IV SOLN
INTRAVENOUS | Status: AC
  Filled 2019-09-07: qty 100

## 2019-09-07 MED ORDER — OXYCODONE HCL 5 MG/5ML PO SOLN: 5.0000 mg | Freq: Once | ORAL | Status: DC | PRN

## 2019-09-07 MED ORDER — SUCCINYLCHOLINE CHLORIDE 200 MG/10ML IV SOSY
PREFILLED_SYRINGE | INTRAVENOUS | Status: AC
  Filled 2019-09-07: qty 10

## 2019-09-07 MED ORDER — ENSURE PRE-SURGERY PO LIQD: 296.0000 mL | Freq: Once | ORAL | Status: DC

## 2019-09-07 MED ORDER — PHENYLEPHRINE 40 MCG/ML (10ML) SYRINGE FOR IV PUSH (FOR BLOOD PRESSURE SUPPORT)
PREFILLED_SYRINGE | INTRAVENOUS | Status: AC
  Filled 2019-09-07: qty 10

## 2019-09-07 MED ORDER — EPHEDRINE 5 MG/ML INJ
INTRAVENOUS | Status: AC
  Filled 2019-09-07: qty 10

## 2019-09-07 MED ORDER — ONDANSETRON HCL 4 MG/2ML IJ SOLN
INTRAMUSCULAR | Status: AC
  Filled 2019-09-07: qty 2

## 2019-09-07 MED ORDER — HEPARIN SOD (PORK) LOCK FLUSH 100 UNIT/ML IV SOLN
INTRAVENOUS | Status: DC | PRN
  Administered 2019-09-07: 500 [IU]

## 2019-09-07 MED ORDER — LIDOCAINE 2% (20 MG/ML) 5 ML SYRINGE
INTRAMUSCULAR | Status: AC
  Filled 2019-09-07: qty 5

## 2019-09-07 MED ORDER — FENTANYL CITRATE (PF) 100 MCG/2ML IJ SOLN
INTRAMUSCULAR | Status: AC
  Filled 2019-09-07: qty 2

## 2019-09-07 MED ORDER — OXYCODONE HCL 5 MG PO TABS: 5.0000 mg | ORAL_TABLET | Freq: Four times a day (QID) | ORAL | 0 refills | Status: DC | PRN

## 2019-09-07 MED ORDER — SCOPOLAMINE 1 MG/3DAYS TD PT72
1.0000 | MEDICATED_PATCH | TRANSDERMAL | Status: DC
  Administered 2019-09-07: 1.5 mg via TRANSDERMAL

## 2019-09-07 MED ORDER — LIDOCAINE 2% (20 MG/ML) 5 ML SYRINGE
INTRAMUSCULAR | Status: DC | PRN
  Administered 2019-09-07: 50 mg via INTRAVENOUS

## 2019-09-07 MED ORDER — SCOPOLAMINE 1 MG/3DAYS TD PT72
MEDICATED_PATCH | TRANSDERMAL | Status: AC
  Filled 2019-09-07: qty 1

## 2019-09-07 MED ORDER — BUPIVACAINE HCL (PF) 0.25 % IJ SOLN
INTRAMUSCULAR | Status: DC | PRN
  Administered 2019-09-07: 30 mL

## 2019-09-07 MED ORDER — MEPERIDINE HCL 25 MG/ML IJ SOLN: 6.2500 mg | INTRAMUSCULAR | Status: DC | PRN

## 2019-09-07 MED ORDER — TECHNETIUM TC 99M SULFUR COLLOID FILTERED
1.0000 | Freq: Once | INTRAVENOUS | Status: AC | PRN
  Administered 2019-09-07: 1 via INTRADERMAL

## 2019-09-07 MED ORDER — BUPIVACAINE HCL 0.5 % IJ SOLN
INTRAMUSCULAR | Status: DC | PRN
  Administered 2019-09-07: 20 mL

## 2019-09-07 MED ORDER — DEXAMETHASONE SODIUM PHOSPHATE 10 MG/ML IJ SOLN
INTRAMUSCULAR | Status: AC
  Filled 2019-09-07: qty 1

## 2019-09-07 MED ORDER — CHLORHEXIDINE GLUCONATE CLOTH 2 % EX PADS: 6.0000 | MEDICATED_PAD | Freq: Once | CUTANEOUS | Status: DC

## 2019-09-07 MED ORDER — CEFAZOLIN SODIUM-DEXTROSE 2-4 GM/100ML-% IV SOLN
2.0000 g | INTRAVENOUS | Status: AC
  Administered 2019-09-07: 2 g via INTRAVENOUS

## 2019-09-07 MED ORDER — MIDAZOLAM HCL 2 MG/2ML IJ SOLN
1.0000 mg | INTRAMUSCULAR | Status: DC | PRN
  Administered 2019-09-07 (×2): 1 mg via INTRAVENOUS

## 2019-09-07 MED ORDER — GABAPENTIN 300 MG PO CAPS
300.0000 mg | ORAL_CAPSULE | ORAL | Status: AC
  Administered 2019-09-07: 300 mg via ORAL

## 2019-09-07 MED ORDER — ONDANSETRON HCL 4 MG/2ML IJ SOLN: 4.0000 mg | Freq: Once | INTRAMUSCULAR | Status: DC | PRN

## 2019-09-07 MED ORDER — OXYCODONE HCL 5 MG PO TABS: 5.0000 mg | ORAL_TABLET | Freq: Once | ORAL | Status: DC | PRN

## 2019-09-07 MED ORDER — MIDAZOLAM HCL 2 MG/2ML IJ SOLN
INTRAMUSCULAR | Status: AC
  Filled 2019-09-07: qty 2

## 2019-09-07 MED ORDER — FENTANYL CITRATE (PF) 100 MCG/2ML IJ SOLN: 25.0000 ug | INTRAMUSCULAR | Status: DC | PRN

## 2019-09-07 MED ORDER — EPHEDRINE SULFATE 50 MG/ML IJ SOLN
INTRAMUSCULAR | Status: DC | PRN
  Administered 2019-09-07: 10 mg via INTRAVENOUS

## 2019-09-07 MED ORDER — BUPIVACAINE LIPOSOME 1.3 % IJ SUSP
INTRAMUSCULAR | Status: DC | PRN
  Administered 2019-09-07: 10 mL via PERINEURAL

## 2019-09-07 MED ORDER — DIPHENHYDRAMINE HCL 50 MG/ML IJ SOLN
INTRAMUSCULAR | Status: DC | PRN
  Administered 2019-09-07: 6.25 mg via INTRAVENOUS

## 2019-09-07 MED ORDER — ACETAMINOPHEN 500 MG PO TABS
ORAL_TABLET | ORAL | Status: AC
  Filled 2019-09-07: qty 2

## 2019-09-07 MED ORDER — PHENYLEPHRINE HCL (PRESSORS) 10 MG/ML IV SOLN
INTRAVENOUS | Status: DC | PRN
  Administered 2019-09-07 (×2): 40 ug via INTRAVENOUS
  Administered 2019-09-07: 20 ug via INTRAVENOUS

## 2019-09-07 MED ORDER — LACTATED RINGERS IV SOLN: INTRAVENOUS | Status: DC

## 2019-09-07 MED ORDER — ACETAMINOPHEN 500 MG PO TABS
1000.0000 mg | ORAL_TABLET | ORAL | Status: AC
  Administered 2019-09-07: 09:00:00 1000 mg via ORAL

## 2019-09-07 MED ORDER — FENTANYL CITRATE (PF) 100 MCG/2ML IJ SOLN
50.0000 ug | INTRAMUSCULAR | Status: DC | PRN
  Administered 2019-09-07 (×2): 50 ug via INTRAVENOUS

## 2019-09-07 MED ORDER — ONDANSETRON HCL 4 MG/2ML IJ SOLN
INTRAMUSCULAR | Status: DC | PRN
  Administered 2019-09-07: 4 mg via INTRAVENOUS

## 2019-09-07 MED ORDER — DEXAMETHASONE SODIUM PHOSPHATE 4 MG/ML IJ SOLN
INTRAMUSCULAR | Status: DC | PRN
  Administered 2019-09-07: 5 mg via INTRAVENOUS

## 2019-09-07 MED ORDER — SODIUM CHLORIDE (PF) 0.9 % IJ SOLN
INTRAVENOUS | Status: DC | PRN
  Administered 2019-09-07: 5 mL via INTRADERMAL

## 2019-09-07 MED ORDER — GABAPENTIN 300 MG PO CAPS
ORAL_CAPSULE | ORAL | Status: AC
  Filled 2019-09-07: qty 1

## 2019-09-07 SURGICAL SUPPLY — 80 items
BAG DECANTER FOR FLEXI CONT (MISCELLANEOUS) ×3 IMPLANT
BINDER BREAST LRG (GAUZE/BANDAGES/DRESSINGS) IMPLANT
BINDER BREAST MEDIUM (GAUZE/BANDAGES/DRESSINGS) IMPLANT
BINDER BREAST XLRG (GAUZE/BANDAGES/DRESSINGS) IMPLANT
BINDER BREAST XXLRG (GAUZE/BANDAGES/DRESSINGS) ×1 IMPLANT
BLADE HEX COATED 2.75 (ELECTRODE) ×3 IMPLANT
BLADE SURG 10 STRL SS (BLADE) ×3 IMPLANT
BLADE SURG 11 STRL SS (BLADE) ×3 IMPLANT
BLADE SURG 15 STRL LF DISP TIS (BLADE) ×2 IMPLANT
BLADE SURG 15 STRL SS (BLADE) ×2
BNDG COHESIVE 4X5 TAN STRL (GAUZE/BANDAGES/DRESSINGS) ×3 IMPLANT
CANISTER SUC SOCK COL 7IN (MISCELLANEOUS) IMPLANT
CANISTER SUCT 1200ML W/VALVE (MISCELLANEOUS) ×3 IMPLANT
CHLORAPREP W/TINT 26 (MISCELLANEOUS) ×3 IMPLANT
CLIP VESOCCLUDE LG 6/CT (CLIP) ×3 IMPLANT
CLIP VESOCCLUDE MED 6/CT (CLIP) ×5 IMPLANT
CLIP VESOCCLUDE SM WIDE 6/CT (CLIP) IMPLANT
COVER BACK TABLE REUSABLE LG (DRAPES) ×3 IMPLANT
COVER MAYO STAND REUSABLE (DRAPES) ×7 IMPLANT
COVER PROBE W GEL 5X96 (DRAPES) ×3 IMPLANT
COVER WAND RF STERILE (DRAPES) IMPLANT
DECANTER SPIKE VIAL GLASS SM (MISCELLANEOUS) IMPLANT
DERMABOND ADVANCED (GAUZE/BANDAGES/DRESSINGS) ×1
DERMABOND ADVANCED .7 DNX12 (GAUZE/BANDAGES/DRESSINGS) ×2 IMPLANT
DRAPE C-ARM 42X72 X-RAY (DRAPES) ×3 IMPLANT
DRAPE LAPAROTOMY TRNSV 102X78 (DRAPES) ×3 IMPLANT
DRAPE UTILITY XL STRL (DRAPES) ×3 IMPLANT
DRSG PAD ABDOMINAL 8X10 ST (GAUZE/BANDAGES/DRESSINGS) ×3 IMPLANT
DRSG TEGADERM 4X4.75 (GAUZE/BANDAGES/DRESSINGS) IMPLANT
ELECT COATED BLADE 2.86 ST (ELECTRODE) ×3 IMPLANT
ELECT REM PT RETURN 9FT ADLT (ELECTROSURGICAL) ×3
ELECTRODE REM PT RTRN 9FT ADLT (ELECTROSURGICAL) ×2 IMPLANT
GAUZE SPONGE 4X4 12PLY STRL (GAUZE/BANDAGES/DRESSINGS) ×1 IMPLANT
GAUZE SPONGE 4X4 12PLY STRL LF (GAUZE/BANDAGES/DRESSINGS) ×3 IMPLANT
GLOVE BIO SURGEON STRL SZ 6 (GLOVE) ×4 IMPLANT
GLOVE BIOGEL PI IND STRL 6.5 (GLOVE) ×2 IMPLANT
GLOVE BIOGEL PI INDICATOR 6.5 (GLOVE) ×2
GOWN STRL REUS W/ TWL LRG LVL3 (GOWN DISPOSABLE) ×2 IMPLANT
GOWN STRL REUS W/TWL 2XL LVL3 (GOWN DISPOSABLE) ×4 IMPLANT
GOWN STRL REUS W/TWL LRG LVL3 (GOWN DISPOSABLE) ×1
IV CONNECTOR ONE LINK NDLESS (IV SETS) IMPLANT
KIT MARKER MARGIN INK (KITS) ×3 IMPLANT
KIT PORT POWER 8FR ISP CVUE (Port) ×1 IMPLANT
LIGHT WAVEGUIDE WIDE FLAT (MISCELLANEOUS) ×1 IMPLANT
NDL FILTER BLUNT 18X1 1/2 (NEEDLE) IMPLANT
NDL HYPO 25X1 1.5 SAFETY (NEEDLE) ×2 IMPLANT
NDL PRECISIONGLIDE 27X1.5 (NEEDLE) IMPLANT
NDL SAFETY ECLIPSE 18X1.5 (NEEDLE) ×2 IMPLANT
NEEDLE FILTER BLUNT 18X 1/2SAF (NEEDLE) ×1
NEEDLE FILTER BLUNT 18X1 1/2 (NEEDLE) ×2 IMPLANT
NEEDLE HYPO 18GX1.5 SHARP (NEEDLE) ×1
NEEDLE HYPO 25X1 1.5 SAFETY (NEEDLE) ×3 IMPLANT
NEEDLE PRECISIONGLIDE 27X1.5 (NEEDLE) ×3 IMPLANT
NS IRRIG 1000ML POUR BTL (IV SOLUTION) ×3 IMPLANT
PACK BASIN DAY SURGERY FS (CUSTOM PROCEDURE TRAY) ×3 IMPLANT
PACK UNIVERSAL I (CUSTOM PROCEDURE TRAY) ×3 IMPLANT
PENCIL SMOKE EVACUATOR (MISCELLANEOUS) ×3 IMPLANT
SLEEVE SCD COMPRESS KNEE MED (MISCELLANEOUS) ×3 IMPLANT
SPONGE LAP 18X18 RF (DISPOSABLE) ×6 IMPLANT
STAPLER VISISTAT 35W (STAPLE) IMPLANT
STOCKINETTE IMPERVIOUS LG (DRAPES) ×3 IMPLANT
STRIP CLOSURE SKIN 1/2X4 (GAUZE/BANDAGES/DRESSINGS) ×3 IMPLANT
SUT ETHILON 2 0 FS 18 (SUTURE) IMPLANT
SUT MNCRL AB 4-0 PS2 18 (SUTURE) ×5 IMPLANT
SUT MON AB 5-0 PS2 18 (SUTURE) IMPLANT
SUT PROLENE 2 0 SH DA (SUTURE) ×7 IMPLANT
SUT SILK 2 0 SH (SUTURE) IMPLANT
SUT VIC AB 2-0 SH 27 (SUTURE) ×1
SUT VIC AB 2-0 SH 27XBRD (SUTURE) ×2 IMPLANT
SUT VIC AB 3-0 SH 27 (SUTURE) ×1
SUT VIC AB 3-0 SH 27X BRD (SUTURE) ×2 IMPLANT
SUT VICRYL 3-0 CR8 SH (SUTURE) ×3 IMPLANT
SYR 10ML LL (SYRINGE) ×3 IMPLANT
SYR 5ML LUER SLIP (SYRINGE) ×3 IMPLANT
SYR BULB 3OZ (MISCELLANEOUS) ×3 IMPLANT
SYR CONTROL 10ML LL (SYRINGE) ×4 IMPLANT
TOWEL GREEN STERILE FF (TOWEL DISPOSABLE) ×3 IMPLANT
TRAY FAXITRON CT DISP (TRAY / TRAY PROCEDURE) ×3 IMPLANT
TUBE CONNECTING 20X1/4 (TUBING) ×3 IMPLANT
YANKAUER SUCT BULB TIP NO VENT (SUCTIONS) ×3 IMPLANT

## 2019-09-07 NOTE — Op Note (Addendum)
Right Breast Radioactive seed localized lumpectomy and sentinel lymph node biopsy, left subclavian port placement  Indications: This patient presents with history of right breast cancer  Pre-operative Diagnosis: right breast cancer, cT1bN0, grade 2 invasive mammary carcinoma (ductal phenotype), +/+/+, UIQ  Post-operative Diagnosis: Same  Surgeon: Stark Klein   Anesthesia: General endotracheal anesthesia  ASA Class: 2  Procedure Details  The patient was seen in the Holding Room. The risks, benefits, complications, treatment options, and expected outcomes were discussed with the patient. The possibilities of bleeding, infection, the need for additional procedures, failure to diagnose a condition, and creating a complication requiring transfusion or operation were discussed with the patient. The patient concurred with the proposed plan, giving informed consent.  The site of surgery properly noted/marked. The patient was taken to Operating Room # 8, identified, and the procedure verified as Right Breast Seed localized Lumpectomy with sentinel lymph node biopsy and port placement.  A Time Out was held and the above information confirmed.  The right arm, breast, and bilateral neck/chest were prepped and draped in standard fashion. The lumpectomy was performed by creating an transverse incision at 3 o'clock far medial near the previously placed radioactive seed.  Dissection was carried down  around the point of maximum signal intensity. The cautery was used to perform the dissection.  Hemostasis was achieved with cautery. The edges of the cavity were marked with large clips, with one each medial, lateral, inferior and superior, and two clips posteriorly.   The specimen was inked with the margin marker paint kit.    Specimen radiography confirmed inclusion of the mammographic lesion, the clip, and the seed.  The background signal in the breast was zero.  The wound was irrigated and closed with 3-0 vicryl  in layers and 4-0 monocryl subcuticular suture.    Using a hand-held gamma probe, right axillary sentinel nodes were identified transcutaneously.  An oblique incision was created below the axillary hairline.  Dissection was carried through the clavipectoral fascia.  Two deep level two axillary sentinel nodes were removed.  Counts per second were 59 and 12.    The background count was 0 cps.  The wound was irrigated.  Hemostasis was achieved with cautery.  The axillary incision was closed with a 3-0 vicryl deep dermal interrupted sutures and a 4-0 monocryl subcuticular closure.  Attention was then directed to the left side.  Local anesthetic was administered over this   area at the angle of the clavicle.  The vein was accessed with 2 pass(es) of the needle. There was good venous return and the wire passed easily with no ectopy.   Fluoroscopy was used to confirm that the wire was in the vena cava.      The patient was placed back level and the area for the pocket was anethetized   with local anesthetic.  A 3-cm transverse incision was made with a #15   blade.  Cautery was used to divide the subcutaneous tissues down to the   pectoralis muscle.  An [employer] retractor was used to elevate the skin   while a pocket was created on top of the pectoralis fascia.  The port   was placed into the pocket to confirm that it was of adequate size.  The   catheter was preattached to the port.  The port was then secured to the   pectoralis fascia with four 2-0 Prolene sutures.  These were clamped and   not tied down yet.    The catheter  was tunneled through to the wire exit   site.  The catheter was placed along the wire to determine what length it should be to be in the SVC.  The catheter was cut at 21 cm.  The tunneler sheath and dilator were passed over the wire and the dilator and wire were removed.  The catheter was advanced through the tunneler sheath and the tunneler sheath was pulled away.  Care was taken  to keep the catheter in the tunneler sheath as this occurred. This was advanced and the tunneler sheath was removed.  There was good venous   return and easy flush of the catheter.  The Prolene sutures were tied   down to the pectoral fascia.  The skin was reapproximated using 3-0   Vicryl interrupted deep dermal sutures.    Fluoroscopy was used to re-confirm good position of the catheter.  The skin   was then closed using 4-0 Monocryl in a subcuticular fashion.  The port was flushed with concentrated heparin flush as well.  The wounds were then cleaned, dried, and dressed with Dermabond.      Sterile dressings were applied. At the end of the operation, all sponge, instrument, and needle counts were correct.  Findings: grossly clear surgical margins and no adenopathy.  Medial margin is lateral sternal border, anterior margin skin, posterior margin is pectoralis.    Estimated Blood Loss:  min         Specimens: right breast lumpectomy with seed and two right axillary sentinel lymph nodes.             Complications:  None; patient tolerated the procedure well.         Disposition: PACU - hemodynamically stable.         Condition: stable  

## 2019-09-07 NOTE — Anesthesia Preprocedure Evaluation (Signed)
Anesthesia Evaluation  Patient identified by MRN, date of birth, ID band Patient awake    Reviewed: Allergy & Precautions, NPO status , Patient's Chart, lab work & pertinent test results  History of Anesthesia Complications (+) PONV and history of anesthetic complications  Airway Mallampati: II  TM Distance: >3 FB Neck ROM: Full    Dental no notable dental hx. (+) Teeth Intact   Pulmonary neg pulmonary ROS,    Pulmonary exam normal breath sounds clear to auscultation       Cardiovascular negative cardio ROS Normal cardiovascular exam Rhythm:Regular Rate:Normal     Neuro/Psych negative neurological ROS  negative psych ROS   GI/Hepatic Neg liver ROS, GERD  Medicated and Controlled,  Endo/Other  Obesity Right Breast Ca  Renal/GU negative Renal ROS  negative genitourinary   Musculoskeletal Plantar Fasciitis   Abdominal (+) + obese,   Peds  Hematology negative hematology ROS (+)   Anesthesia Other Findings   Reproductive/Obstetrics                             Anesthesia Physical Anesthesia Plan  ASA: II  Anesthesia Plan: General   Post-op Pain Management:  Regional for Post-op pain   Induction:   PONV Risk Score and Plan: 4 or greater and Ondansetron, Treatment may vary due to age or medical condition, Dexamethasone and Scopolamine patch - Pre-op  Airway Management Planned: LMA  Additional Equipment:   Intra-op Plan:   Post-operative Plan: Extubation in OR  Informed Consent: I have reviewed the patients History and Physical, chart, labs and discussed the procedure including the risks, benefits and alternatives for the proposed anesthesia with the patient or authorized representative who has indicated his/her understanding and acceptance.     Dental advisory given  Plan Discussed with: CRNA and Surgeon  Anesthesia Plan Comments:         Anesthesia Quick  Evaluation

## 2019-09-07 NOTE — Anesthesia Procedure Notes (Signed)
Procedure Name: LMA Insertion Date/Time: 09/07/2019 10:15 AM Performed by: Willa Frater, CRNA Pre-anesthesia Checklist: Patient identified, Emergency Drugs available, Suction available and Patient being monitored Patient Re-evaluated:Patient Re-evaluated prior to induction Oxygen Delivery Method: Circle system utilized Preoxygenation: Pre-oxygenation with 100% oxygen Induction Type: IV induction Ventilation: Mask ventilation without difficulty LMA: LMA inserted LMA Size: 4.0 Number of attempts: 1 Airway Equipment and Method: Bite block Placement Confirmation: positive ETCO2 Tube secured with: Tape Dental Injury: Teeth and Oropharynx as per pre-operative assessment

## 2019-09-07 NOTE — Transfer of Care (Signed)
Immediate Anesthesia Transfer of Care Note  Patient: Amy Harrington  Procedure(s) Performed: RIGHT BREAST LUMPECTOMY WITH RADIOACTIVE SEED AND SENTINEL LYMPH NODE BIOPSY (Right Breast) INSERTION PORT-A-CATH WITH ULTRASOUND (Left Chest)  Patient Location: PACU  Anesthesia Type:General  Level of Consciousness: awake, alert , oriented and drowsy  Airway & Oxygen Therapy: Patient Spontanous Breathing and Patient connected to face mask oxygen  Post-op Assessment: Report given to RN and Post -op Vital signs reviewed and stable  Post vital signs: Reviewed and stable  Last Vitals:  Vitals Value Taken Time  BP    Temp    Pulse    Resp    SpO2      Last Pain:  Vitals:   09/07/19 0841  TempSrc: Tympanic  PainSc: 0-No pain      Patients Stated Pain Goal: 5 ([id number])  Complications: No apparent anesthesia complications

## 2019-09-07 NOTE — Progress Notes (Signed)
Assisted Dr. Foster with right, ultrasound guided, pectoralis block. Side rails up, monitors on throughout procedure. See vital signs in flow sheet. Tolerated Procedure well. 

## 2019-09-07 NOTE — Anesthesia Postprocedure Evaluation (Signed)
Anesthesia Post Note  Patient: Amy Harrington  Procedure(s) Performed: RIGHT BREAST LUMPECTOMY WITH RADIOACTIVE SEED AND SENTINEL LYMPH NODE BIOPSY (Right Breast) INSERTION PORT-A-CATH WITH ULTRASOUND (Left Chest)     Patient location during evaluation: PACU Anesthesia Type: General Level of consciousness: awake and alert and oriented Pain management: pain level controlled Vital Signs Assessment: post-procedure vital signs reviewed and stable Respiratory status: spontaneous breathing, nonlabored ventilation and respiratory function stable Cardiovascular status: blood pressure returned to baseline and stable Postop Assessment: no apparent nausea or vomiting Anesthetic complications: no    Last Vitals:  Vitals:   09/07/19 1230 09/07/19 1245  BP: 130/69 133/72  Pulse: 75 76  Resp: 13 15  Temp:    SpO2: 100% 99%    Last Pain:  Vitals:   09/07/19 1300  TempSrc:   PainSc: 0-No pain                 Greysin Medlen A.

## 2019-09-07 NOTE — Discharge Instructions (Addendum)
[place] Office Phone Number [phone]  BREAST BIOPSY/ PARTIAL MASTECTOMY: POST OP INSTRUCTIONS  Always review your discharge instruction sheet given to you by the facility where your surgery was performed.  IF YOU HAVE DISABILITY OR FAMILY LEAVE FORMS, YOU MUST BRING THEM TO THE OFFICE FOR PROCESSING.  DO NOT GIVE THEM TO YOUR DOCTOR.  1. A prescription for pain medication may be given to you upon discharge.  Take your pain medication as prescribed, if needed.  If narcotic pain medicine is not needed, then you may take acetaminophen (Tylenol) or ibuprofen (Advil) as needed. 2. Take your usually prescribed medications unless otherwise directed 3. If you need a refill on your pain medication, please contact your pharmacy.  They will contact our office to request authorization.  Prescriptions will not be filled after 5pm or on week-ends. 4. You should eat very light the first 24 hours after surgery, such as soup, crackers, pudding, etc.  Resume your normal diet the day after surgery. 5. Most patients will experience some swelling and bruising in the breast.  Ice packs and a good support bra will help.  Swelling and bruising can take several days to resolve.  6. It is common to experience some constipation if taking pain medication after surgery.  Increasing fluid intake and taking a stool softener will usually help or prevent this problem from occurring.  A mild laxative (Milk of Magnesia or Miralax) should be taken according to package directions if there are no bowel movements after 48 hours. 7. Unless discharge instructions indicate otherwise, you may remove your bandages 48 hours after surgery, and you may shower at that time.  You may have steri-strips (small skin tapes) in place directly over the incision.  These strips should be left on the skin for 7-10 days.   Any sutures or staples will be removed at the office during your follow-up visit. 8. ACTIVITIES:  You may resume  regular daily activities (gradually increasing) beginning the next day.  Wearing a good support bra or sports bra (or the breast binder) minimizes pain and swelling.  You may have sexual intercourse when it is comfortable. a. You may drive when you no longer are taking prescription pain medication, you can comfortably wear a seatbelt, and you can safely maneuver your car and apply brakes. b. RETURN TO WORK:  __________1 week_______________ 9. You should see your doctor in the office for a follow-up appointment approximately two weeks after your surgery.  Your doctor's nurse will typically make your follow-up appointment when she calls you with your pathology report.  Expect your pathology report 2-3 business days after your surgery.  You may call to check if you do not hear from [place] after three days.   WHEN TO CALL YOUR DOCTOR: 1. Fever over 101.0 2. Nausea and/or vomiting. 3. Extreme swelling or bruising. 4. Continued bleeding from incision. 5. Increased pain, redness, or drainage from the incision.  The clinic staff is available to answer your questions during regular business hours.  Please don't hesitate to call and ask to speak to one of the nurses for clinical concerns.  If you have a medical emergency, go to the nearest emergency room or call 911.  A surgeon from [facility] is always on call at the hospital.  For further questions, please visit [email or web address]    Post Anesthesia Home Care Instructions  Activity: Get plenty of rest for the remainder of the day. A responsible individual must stay with you for 24  hours following the procedure.  For the next 24 hours, DO NOT: -Drive a car -Operate machinery -Drink alcoholic beverages -Take any medication unless instructed by your physician -Make any legal decisions or sign important papers.  Meals: Start with liquid foods such as gelatin or soup. Progress to regular foods as tolerated. Avoid greasy, spicy,  heavy foods. If nausea and/or vomiting occur, drink only clear liquids until the nausea and/or vomiting subsides. Call your physician if vomiting continues.  Special Instructions/Symptoms: Your throat may feel dry or sore from the anesthesia or the breathing tube placed in your throat during surgery. If this causes discomfort, gargle with warm salt water. The discomfort should disappear within 24 hours.  If you had a scopolamine patch placed behind your ear for the management of post- operative nausea and/or vomiting:  1. The medication in the patch is effective for 72 hours, after which it should be removed.  Wrap patch in a tissue and discard in the trash. Wash hands thoroughly with soap and water. 2. You may remove the patch earlier than 72 hours if you experience unpleasant side effects which may include dry mouth, dizziness or visual disturbances. 3. Avoid touching the patch. Wash your hands with soap and water after contact with the patch.    Information for Discharge Teaching: EXPAREL (bupivacaine liposome injectable suspension)   Your surgeon or anesthesiologist gave you EXPAREL(bupivacaine) to help control your pain after surgery.   EXPAREL is a local anesthetic that provides pain relief by numbing the tissue around the surgical site.  EXPAREL is designed to release pain medication over time and can control pain for up to 72 hours.  Depending on how you respond to EXPAREL, you may require less pain medication during your recovery.  Possible side effects:  Temporary loss of sensation or ability to move in the area where bupivacaine was injected.  Nausea, vomiting, constipation  Rarely, numbness and tingling in your mouth or lips, lightheadedness, or anxiety may occur.  Call your doctor right away if you think you may be experiencing any of these sensations, or if you have other questions regarding possible side effects.  Follow all other discharge instructions given to you by  your surgeon or nurse. Eat a healthy diet and drink plenty of water or other fluids.  If you return to the hospital for any reason within 96 hours following the administration of EXPAREL, it is important for health care providers to know that you have received this anesthetic. A teal colored band has been placed on your arm with the date, time and amount of EXPAREL you have received in order to alert and inform your health care providers. Please leave this armband in place for the full 96 hours following administration, and then you may remove the band.  

## 2019-09-07 NOTE — Anesthesia Procedure Notes (Signed)
Anesthesia Regional Block: Pectoralis block   Pre-Anesthetic Checklist: ,, timeout performed, Correct Patient, Correct Site, Correct Laterality, Correct Procedure, Correct Position, site marked, Risks and benefits discussed,  Surgical consent,  Pre-op evaluation,  At surgeon's request and post-op pain management  Laterality: Right  Prep: chloraprep       Needles:  Injection technique: Single-shot  Needle Type: Echogenic Stimulator Needle     Needle Length: 9cm  Needle Gauge: 21   Needle insertion depth: 7 cm   Additional Needles:   Procedures:,,,, ultrasound used (permanent image in chart),,,,  Narrative:  Start time: 09/07/2019 8:53 AM End time: 09/07/2019 8:58 AM Injection made incrementally with aspirations every 5 mL.  Performed by: Personally  Anesthesiologist: Josephine Igo, MD  Additional Notes: Timeout performed. Patient sedated. Relevant anatomy ID'd using [place]. Incremental 2-63ml injection of LA with frequent aspiration. Patient tolerated procedure well.        Right Pectoralis Block

## 2019-09-07 NOTE — Interval H&P Note (Signed)
History and Physical Interval Note:  09/07/2019 9:54 AM  Amy Harrington  has presented today for surgery, with the diagnosis of RIGHT BREAST CANCER.  The various methods of treatment have been discussed with the patient and family. After consideration of risks, benefits and other options for treatment, the patient has consented to  Procedure(s): RIGHT BREAST LUMPECTOMY WITH RADIOACTIVE SEED AND SENTINEL LYMPH NODE BIOPSY (Right) INSERTION PORT-A-CATH WITH ULTRASOUND (N/A) as a surgical intervention.  The patient's history has been reviewed, patient examined, no change in status, stable for surgery.  I have reviewed the patient's chart and labs.  Questions were answered to the patient's satisfaction.     Stark Klein

## 2019-09-10 ENCOUNTER — Encounter: Payer: Self-pay | Admitting: *Deleted

## 2019-09-11 LAB — SURGICAL PATHOLOGY

## 2019-09-17 NOTE — Progress Notes (Signed)
Patient Care Team: Carol Ada, MD as PCP - General (Family Medicine) Rockwell Germany, RN as Oncology Nurse Navigator Tressie Ellis, Paulette Blanch, RN as Oncology Nurse Navigator Nicholas Lose, MD as Consulting Physician (Hematology and Oncology) Kyung Rudd, MD as Consulting Physician (Radiation Oncology) Stark Klein, MD as Consulting Physician (General Surgery) Kyung Rudd, MD as Consulting Physician (Radiation Oncology)  DIAGNOSIS:    ICD-10-CM   1. Malignant neoplasm of 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, estrogen receptor positive ([place])  C50.211    Z17.0     SUMMARY OF ONCOLOGIC HISTORY: Oncology History  Malignant neoplasm of 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, estrogen receptor positive ([place])  08/17/2019 Initial Diagnosis   Routine screening mammogram detected a 0.8cm indeterminate mass in the right breast. Biopsy showed invasive ductal carcinoma, grade 2, HER-2 + by FISH, ER+ 100%, PR+ 70%, Ki67 15%.    08/22/2019 Cancer Staging   Staging form: Breast, AJCC 8th Edition - Clinical stage from 08/22/2019: Stage IA (cT1b, cN0, cM0, G2, ER+, PR+, HER2+) - Signed by Nicholas Lose, MD on 08/22/2019   09/07/2019 Surgery   Right lumpectomy [facility]): IDC with calcifications, grade 2, 1.5cm, clear margins, 2 lymph nodes negative.      CHIEF COMPLIANT: Follow-up s/p lumpectomy to review pathology  INTERVAL HISTORY: Amy Harrington is a 68 y.o. with above-mentioned history of right breast cancer. She underwent a right lumpectomy on 09/07/19 with Dr. Barry Dienes for which pathology showed invasive ductal carcinoma with calcifications, grade 2, 1.5cm, clear margins, 2 lymph nodes negative for carcinoma. She presents to the clinic today to review the pathology report and discuss further treatment.   REVIEW OF SYSTEMS:   Constitutional: Denies fevers, chills or abnormal weight loss Eyes: Denies blurriness of vision Ears, nose, mouth, throat, and face: Denies mucositis or sore  throat Respiratory: Denies cough, dyspnea or wheezes Cardiovascular: Denies palpitation, chest discomfort Gastrointestinal: Denies nausea, heartburn or change in bowel habits Skin: Denies abnormal skin rashes Lymphatics: Denies new lymphadenopathy or easy bruising Neurological: Denies numbness, tingling or new weaknesses Behavioral/Psych: Mood is stable, no new changes  Extremities: No lower extremity edema Breast: denies any pain or lumps or nodules in either breasts All other systems were reviewed with the patient and are negative.  I have reviewed the past medical history, past surgical history, social history and family history with the patient and they are unchanged from previous note.  ALLERGIES:  has No Known Allergies.  MEDICATIONS:  Current Outpatient Medications  Medication Sig Dispense Refill  . esomeprazole (NEXIUM) 20 MG capsule Take 20 mg by mouth daily at 12 noon.    . Multiple Vitamin (MULTIVITAMIN) tablet Take 1 tablet by mouth daily.    Marland Kitchen oxyCODONE (OXY IR/ROXICODONE) 5 MG immediate release tablet Take 1 tablet (5 mg total) by mouth every 6 (six) hours as needed for severe pain. 15 tablet 0   No current facility-administered medications for this visit.    PHYSICAL EXAMINATION: ECOG PERFORMANCE STATUS: 1 - Symptomatic but completely ambulatory  Vitals:   09/18/19 1436  BP: (!) 148/70  Pulse: 88  Resp: 18  Temp: 98.5 F (36.9 C)  SpO2: 95%   Filed Weights   09/18/19 1436  Weight: 193 lb (87.5 kg)    GENERAL: alert, no distress and comfortable SKIN: skin color, texture, turgor are normal, no rashes or significant lesions EYES: normal, Conjunctiva are pink and non-injected, sclera clear OROPHARYNX: no exudate, no erythema and lips, buccal mucosa, and tongue normal  NECK: supple, thyroid normal size, non-tender, without nodularity LYMPH: no palpable lymphadenopathy in the cervical, axillary or inguinal LUNGS: clear to auscultation and percussion with  normal breathing effort HEART: regular rate & rhythm and no murmurs and no lower extremity edema ABDOMEN: abdomen soft, non-tender and normal bowel sounds MUSCULOSKELETAL: no cyanosis of digits and no clubbing  NEURO: alert & oriented x 3 with fluent speech, no focal motor/sensory deficits EXTREMITIES: No lower extremity edema  LABORATORY DATA:  I have reviewed the data as listed CMP Latest Ref Rng & Units 08/22/2019  Glucose 70 - 99 mg/dL 96  BUN 8 - 23 mg/dL 13  Creatinine 0.44 - 1.00 mg/dL 0.78  Sodium 135 - 145 mmol/L 143  Potassium 3.5 - 5.1 mmol/L 4.3  Chloride 98 - 111 mmol/L 109  CO2 22 - 32 mmol/L 24  Calcium 8.9 - 10.3 mg/dL 9.5  Total Protein 6.5 - 8.1 g/dL 6.6  Total Bilirubin 0.3 - 1.2 mg/dL 0.4  Alkaline Phos 38 - 126 U/L 67  AST 15 - 41 U/L 25  ALT 0 - 44 U/L 25    Lab Results  Component Value Date   WBC 5.8 08/22/2019   HGB 14.9 08/22/2019   HCT 44.9 08/22/2019   MCV 91.8 08/22/2019   PLT 208 08/22/2019   NEUTROABS 3.6 08/22/2019    ASSESSMENT & PLAN:  Malignant neoplasm of 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, estrogen receptor positive ([place]) 09/07/2019:Right lumpectomy ([name]): IDC with calcifications, grade 2, 1.5cm, clear margins, 2 lymph nodes negative.  ER 100%, PR 70%, HER-2 positive, Ki-67 15% T1c N0 stage Ia  Pathology counseling: I discussed the final pathology report of the patient provided  a copy of this report. I discussed the margins as well as lymph node surgeries. We also discussed the final staging along with previously performed ER/PR and HER-2/neu testing.  Treatment plan: 1. Adjuvant chemotherapy with Taxol [name] weekly x12 followed by [name] maintenance for 1 year 2.  Follow-up adjuvant radiation 3.  Follow-up adjuvant antiestrogen therapy  Return to clinic in 3 weeks to start chemotherapy.    No orders of the defined types were placed in this encounter.  The patient has a good understanding of the overall  plan. she agrees with it. she will call with any problems that may develop before the next visit here.  Nicholas Lose, MD 09/18/2019  Julious Oka Dorshimer, am acting as scribe for Dr. Nicholas Lose.  I have reviewed the above document for accuracy and completeness, and I agree with the above.

## 2019-09-18 ENCOUNTER — Inpatient Hospital Stay: Payer: Medicare Other

## 2019-09-18 ENCOUNTER — Inpatient Hospital Stay (HOSPITAL_BASED_OUTPATIENT_CLINIC_OR_DEPARTMENT_OTHER): Payer: Medicare Other | Admitting: Hematology and Oncology

## 2019-09-18 ENCOUNTER — Other Ambulatory Visit: Payer: Self-pay

## 2019-09-18 DIAGNOSIS — Z170 Estrogen receptor positive status [ER+]: Secondary | ICD-10-CM | POA: Diagnosis not present

## 2019-09-18 DIAGNOSIS — C50211 Malignant neoplasm of upper-inner quadrant of right female breast: Secondary | ICD-10-CM

## 2019-09-18 MED ORDER — LIDOCAINE-PRILOCAINE 2.5-2.5 % EX CREA: 1.0000 "application " | TOPICAL_CREAM | CUTANEOUS | 0 refills | Status: DC | PRN

## 2019-09-18 MED ORDER — ONDANSETRON HCL 8 MG PO TABS: 8.0000 mg | ORAL_TABLET | Freq: Three times a day (TID) | ORAL | 0 refills | Status: DC | PRN

## 2019-09-18 MED ORDER — PROCHLORPERAZINE MALEATE 10 MG PO TABS: 10.0000 mg | ORAL_TABLET | Freq: Four times a day (QID) | ORAL | 0 refills | Status: DC | PRN

## 2019-09-18 NOTE — Assessment & Plan Note (Addendum)
09/07/2019:Right lumpectomy [facility]): IDC with calcifications, grade 2, 1.5cm, clear margins, 2 lymph nodes negative.  ER 100%, PR 70%, HER-2 positive, Ki-67 15% T1c N0 stage Ia  Pathology counseling: I discussed the final pathology report of the patient provided  a copy of this report. I discussed the margins as well as lymph node surgeries. We also discussed the final staging along with previously performed ER/PR and HER-2/neu testing.  Treatment plan: 1. Adjuvant chemotherapy with Taxol [name] weekly x12 followed by [name] maintenance for 1 year 2.  Follow-up adjuvant radiation 3.  Follow-up adjuvant antiestrogen therapy  Counseled her about swallowing S 1714 clinical trial for neuropathy Return to clinic in 3 weeks to start chemotherapy.

## 2019-09-20 ENCOUNTER — Telehealth: Payer: Self-pay | Admitting: Hematology and Oncology

## 2019-09-20 NOTE — Telephone Encounter (Signed)
Scheduled per 12/29 los, patient has been called and notified.  

## 2019-09-21 HISTORY — PX: CATARACT EXTRACTION W/ INTRAOCULAR LENS IMPLANT: SHX1309

## 2019-09-24 ENCOUNTER — Encounter: Payer: Self-pay | Admitting: *Deleted

## 2019-09-28 ENCOUNTER — Other Ambulatory Visit (HOSPITAL_COMMUNITY): Payer: Self-pay

## 2019-09-28 DIAGNOSIS — C50211 Malignant neoplasm of upper-inner quadrant of right female breast: Secondary | ICD-10-CM

## 2019-09-28 DIAGNOSIS — Z170 Estrogen receptor positive status [ER+]: Secondary | ICD-10-CM

## 2019-09-28 DIAGNOSIS — I5022 Chronic systolic (congestive) heart failure: Secondary | ICD-10-CM

## 2019-10-01 ENCOUNTER — Ambulatory Visit: Payer: Medicare Other | Attending: General Surgery | Admitting: Physical Therapy

## 2019-10-01 ENCOUNTER — Encounter: Payer: Self-pay | Admitting: Physical Therapy

## 2019-10-01 ENCOUNTER — Other Ambulatory Visit: Payer: Self-pay

## 2019-10-01 DIAGNOSIS — Z483 Aftercare following surgery for neoplasm: Secondary | ICD-10-CM | POA: Diagnosis present

## 2019-10-01 DIAGNOSIS — Z170 Estrogen receptor positive status [ER+]: Secondary | ICD-10-CM | POA: Insufficient documentation

## 2019-10-01 DIAGNOSIS — R293 Abnormal posture: Secondary | ICD-10-CM | POA: Diagnosis present

## 2019-10-01 DIAGNOSIS — C50211 Malignant neoplasm of upper-inner quadrant of right female breast: Secondary | ICD-10-CM | POA: Insufficient documentation

## 2019-10-01 NOTE — Patient Instructions (Signed)
            [facility]         [address], [place] [address]         [phone]         Annia Friendly, PT, CLT   After Breast Cancer Class This would be good to do to learn about lymphedema risk reduction. It's a free class you participate in 1 time using [employer].   Scar massage Use coconut oil on your breast and armpit incisions to reduce scar tissue and tightness. Do this for a few minutes each day.    Home exercise Program Continue the exercises I gave you as you get close to radiation so you can get in the position they need you to.    Follow up PT You don't need to come back to see me unless something changes. You're doing great!

## 2019-10-01 NOTE — Therapy (Signed)
[facility] [address] [place], [place], [address] Phone: [phone]   Fax:  [phone]  Physical Therapy Treatment  Patient Details  Name: Amy Harrington MRN: [id number] Date of Birth: 11/09/1950 Referring Provider (PT): Dr. Faera [name]   Encounter Date: 10/01/2019  PT End of Session - 10/01/19 1158    Visit Number  2    Number of Visits  2    PT Start Time  1056    PT Stop Time  1150    PT Time Calculation (min)  54 min    Activity Tolerance  Treatment limited secondary to agitation       Past Medical History:  Diagnosis Date  . GERD (gastroesophageal reflux disease)   . Plantar fasciitis   . PONV (postoperative nausea and vomiting)     Past Surgical History:  Procedure Laterality Date  . BREAST LUMPECTOMY WITH RADIOACTIVE SEED AND SENTINEL LYMPH NODE BIOPSY Right 09/07/2019   Procedure: RIGHT BREAST LUMPECTOMY WITH RADIOACTIVE SEED AND SENTINEL LYMPH NODE BIOPSY;  Surgeon: [name], Faera, MD;  Location: [facility];  Service: General;  Laterality: Right;  . HERNIA REPAIR    . PORTACATH PLACEMENT Left 09/07/2019   Procedure: INSERTION PORT-A-CATH WITH ULTRASOUND;  Surgeon: [name], Faera, MD;  Location: [facility];  Service: General;  Laterality: Left;    There were no vitals filed for this visit.  Subjective Assessment - 10/01/19 1057    Subjective  Patient reports she underwent a right lumpectomy and sentinel node biopsy (2 negative nodes) on 09/07/2019. She will begin chemotherapy on 10/04/2019 as she is triple positive. This will be followed by radiation and anti-estrogen therapy.    Pertinent History  Patient was diagnosed on 07/17/2019 with right grade II triple positive invasive ductal carcinoma breast cancer. Patient reports she underwent a right lumpectomy and sentinel node biopsy (2 negative nodes) on 09/07/2019. The Ki67 is 15%.    Patient Stated Goals  See if my arm is doing  ok    Currently in Pain?  No/denies   Sometimes I get a twinge in my right breast near my incision        [facility] PT Assessment - 10/01/19 0001      Assessment   Medical Diagnosis  s/p right lumpectomy and SLNB    Referring Provider (PT)  Dr. Faera [name]    Onset Date/Surgical Date  09/07/19    Hand Dominance  Right    Prior Therapy  Baselines      Precautions   Precautions  Other (comment)    Precaution Comments  right arm lymphedema risk      Restrictions   Weight Bearing Restrictions  No      Home Environment   Living Environment  Private residence    Living Arrangements  Alone    Available Help at Discharge  Family      Prior Function   Level of Independence  Independent    Vocation  Full time employment    Vocation Requirements  Runs an electrical contractor business; mostly desk work    Leisure  Tries to get 7000-10,000 steps each day but I don't exercise      Cognition   Overall Cognitive Status  Within Functional Limits for tasks assessed      Observation/Other Assessments   Observations  Axillary and breast incisions on right side both appear to be well healed. Some thickness present around incisions due to scar tissue.        Posture/Postural Control   Posture/Postural Control  Postural limitations    Postural Limitations  Rounded Shoulders;Forward head      ROM / Strength   AROM / PROM / Strength  AROM      AROM   Right/Left Shoulder  Right    Right Shoulder Extension  48 Degrees    Right Shoulder Flexion  134 Degrees    Right Shoulder ABduction  143 Degrees    Right Shoulder Internal Rotation  67 Degrees    Right Shoulder External Rotation  75 Degrees        LYMPHEDEMA/ONCOLOGY QUESTIONNAIRE - 10/01/19 1103      Type   Cancer Type  Right breast cancer      Surgeries   Lumpectomy Date  09/07/19    Sentinel Lymph Node Biopsy Date  09/07/19    Number Lymph Nodes Removed  2      Treatment   Active Chemotherapy Treatment  Yes   10/04/2019    Date  10/04/19    Past Chemotherapy Treatment  No    Active Radiation Treatment  No    Past Radiation Treatment  No    Current Hormone Treatment  No    Past Hormone Therapy  No      What other symptoms do you have   Are you Having Heaviness or Tightness  No    Are you having Pain  No    Are you having pitting edema  No    Is it Hard or Difficult finding clothes that fit  No    Do you have infections  No    Is there Decreased scar mobility  No    Stemmer Sign  No      Lymphedema Assessments   Lymphedema Assessments  Upper extremities      Right Upper Extremity Lymphedema   10 cm Proximal to Olecranon Process  27.7 cm    Olecranon Process  24.7 cm    10 cm Proximal to Ulnar Styloid Process  22.6 cm    Just Proximal to Ulnar Styloid Process  15.9 cm    Across Hand at [employer]  19.4 cm    At [place] of 2nd Digit  6.5 cm      Left Upper Extremity Lymphedema   10 cm Proximal to Olecranon Process  30.2 cm    Olecranon Process  25.6 cm    10 cm Proximal to Ulnar Styloid Process  23.1 cm    Just Proximal to Ulnar Styloid Process  17.1 cm    Across Hand at [employer]  19 cm    At [place] of 2nd Digit  6.4 cm        Quick Dash - 10/01/19 0001    Open a tight or new jar  No difficulty    Do heavy household chores (wash walls, wash floors)  No difficulty    Carry a shopping bag or briefcase  No difficulty    Wash your back  No difficulty    Use a knife to cut food  No difficulty    Recreational activities in which you take some force or impact through your arm, shoulder, or hand (golf, hammering, tennis)  Mild difficulty    During the past week, to what extent has your arm, shoulder or hand problem interfered with your normal social activities with family, friends, neighbors, or groups?  Not at all    During the past week, to what extent has your  arm, shoulder or hand problem limited your work or other regular daily activities  Not at all    Arm, shoulder, or hand pain.  None     Tingling (pins and needles) in your arm, shoulder, or hand  None    Difficulty Sleeping  No difficulty    DASH Score  2.27 %                     PT Education - 10/01/19 1158    Education Details  Lymphedema risk reduction, importance of a walking program, scar massage    Person(s) Educated  Patient    Methods  Explanation;Demonstration;Handout    Comprehension  Returned demonstration;Verbalized understanding          PT Long Term Goals - 10/01/19 1202      PT LONG TERM GOAL #1   Title  Patient will demonstrate she has regained full shoulder ROM and function post operatively compared ot baselines.    Time  8    Period  Weeks    Status  Achieved            Plan - 10/01/19 1159    Clinical Impression Statement  Patient is doing very well s/p right lumpectomy and SLNB on 09/07/2019. Her incisions have healed well and she appears to have returned to full function with her arm including work tasks. She has no signs of lymphedema and shoulder ROM is back to baseline. She will begin chemotherapy 10/04/2019 followed by radiation. She plans to particpate in the After Breast Cancer class on 10/22/2019 virtually to learn more about lymphedema risk reduction. Otherwise, she has no PT needs at this time.    PT Treatment/Interventions  ADLs/Self Care Home Management;Therapeutic exercise;Patient/family education    PT Next Visit Plan  D/C - goals met    PT Home Exercise Plan  Post op shoulder ROM HEP    Consulted and Agree with Plan of Care  Patient       Patient will benefit from skilled therapeutic intervention in order to improve the following deficits and impairments:  Postural dysfunction, Decreased range of motion, Decreased knowledge of precautions, Impaired UE functional use, Pain  Visit Diagnosis: Malignant neoplasm of 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, estrogen receptor positive (HCC)  Abnormal posture  Aftercare following surgery for  neoplasm     Problem List Patient Active Problem List   Diagnosis Date Noted  . Malignant neoplasm of 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, estrogen receptor positive ([place]) 08/17/2019   PHYSICAL THERAPY DISCHARGE SUMMARY  Visits from Start of Care: 2  Current functional level related to goals / functional outcomes: Goals met. See above for objective findings.   Remaining deficits: Some tissue tightness at incision site.   Education / Equipment: Lymphedema risk reduction and post op shoulder ROM HEP  Plan: Patient agrees to discharge.  Patient goals were met. Patient is being discharged due to meeting the stated rehab goals.  ?????         Annia Friendly, [place] 10/01/19 12:03 PM  [place], [place], [address] Phone: [phone]   Fax:  [phone]  Name: Amy Harrington MRN: [id number] Date of Birth: [birth year]

## 2019-10-03 ENCOUNTER — Other Ambulatory Visit: Payer: Self-pay | Admitting: *Deleted

## 2019-10-03 ENCOUNTER — Other Ambulatory Visit: Payer: Self-pay | Admitting: Hematology and Oncology

## 2019-10-03 DIAGNOSIS — Z170 Estrogen receptor positive status [ER+]: Secondary | ICD-10-CM

## 2019-10-03 DIAGNOSIS — C50211 Malignant neoplasm of upper-inner quadrant of right female breast: Secondary | ICD-10-CM

## 2019-10-03 MED ORDER — LORAZEPAM 0.5 MG PO TABS: 0.5000 mg | ORAL_TABLET | Freq: Every evening | ORAL | 0 refills | Status: DC | PRN

## 2019-10-03 MED ORDER — ONDANSETRON HCL 8 MG PO TABS: 8.0000 mg | ORAL_TABLET | Freq: Two times a day (BID) | ORAL | 1 refills | Status: DC | PRN

## 2019-10-03 MED ORDER — LIDOCAINE-PRILOCAINE 2.5-2.5 % EX CREA: TOPICAL_CREAM | CUTANEOUS | 3 refills | Status: DC

## 2019-10-03 MED ORDER — PROCHLORPERAZINE MALEATE 10 MG PO TABS: 10.0000 mg | ORAL_TABLET | Freq: Four times a day (QID) | ORAL | 1 refills | Status: DC | PRN

## 2019-10-03 NOTE — Progress Notes (Signed)
START ON PATHWAY REGIMEN - Breast     Cycle 1: A cycle is 7 days:     Trastuzumab-xxxx      Paclitaxel    Cycles 2 through 12: A cycle is every 7 days:     Trastuzumab-xxxx      Paclitaxel    Cycles 13 through 25: A cycle is every 21 days:     Trastuzumab-xxxx   **Always confirm dose/schedule in your pharmacy ordering system**  Patient Characteristics: Postoperative without Neoadjuvant Therapy (Pathologic Staging), Invasive Disease, Adjuvant Therapy, HER2 Positive, ER Positive, Node Negative, pT1c, pN0/N36m Therapeutic Status: Postoperative without Neoadjuvant Therapy (Pathologic Staging) AJCC Grade: G2 AJCC N Category: pN0 AJCC M Category: cM0 ER Status: Positive (+) AJCC 8 Stage Grouping: IA HER2 Status: Positive (+) Oncotype Dx Recurrence Score: Not Appropriate AJCC T Category: pT1c PR Status: Positive (+) Intent of Therapy: Curative Intent, Discussed with Patient

## 2019-10-03 NOTE — Progress Notes (Signed)
Patient Care Team: Carol Ada, MD as PCP - General (Family Medicine) Rockwell Germany, RN as Oncology Nurse Navigator Tressie Ellis, Paulette Blanch, RN as Oncology Nurse Navigator Nicholas Lose, MD as Consulting Physician (Hematology and Oncology) Kyung Rudd, MD as Consulting Physician (Radiation Oncology) Stark Klein, MD as Consulting Physician (General Surgery) Kyung Rudd, MD as Consulting Physician (Radiation Oncology)  DIAGNOSIS:    ICD-10-CM   1. Malignant neoplasm of 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, estrogen receptor positive ([place])  C50.211    Z17.0     SUMMARY OF ONCOLOGIC HISTORY: Oncology History  Malignant neoplasm of 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, estrogen receptor positive ([place])  08/17/2019 Initial Diagnosis   Routine screening mammogram detected a 0.8cm indeterminate mass in the right breast. Biopsy showed invasive ductal carcinoma, grade 2, HER-2 + by FISH, ER+ 100%, PR+ 70%, Ki67 15%.    08/22/2019 Cancer Staging   Staging form: Breast, AJCC 8th Edition - Clinical stage from 08/22/2019: Stage IA (cT1b, cN0, cM0, G2, ER+, PR+, HER2+) - Signed by Nicholas Lose, MD on 08/22/2019   09/07/2019 Surgery   Right lumpectomy [facility]): IDC with calcifications, grade 2, 1.5cm, clear margins, 2 lymph nodes negative.    10/04/2019 -  Chemotherapy   The patient had PACLitaxel (TAXOL) 156 mg in sodium chloride 0.9 % 250 mL chemo infusion (</= 60m/m2), 80 mg/m2 = 156 mg, Intravenous,  Once, 0 of 3 cycles trastuzumab-dkst (OGIVRI) 357 mg in sodium chloride 0.9 % 250 mL chemo infusion, 4 mg/kg = 357 mg, Intravenous,  Once, 0 of 16 cycles  for chemotherapy treatment.      CHIEF COMPLIANT: Cycle 1 Taxol [name]  INTERVAL HISTORY: Amy HUNDALis a 69y.o. with above-mentioned history of right breast cancer who underwent a right lumpectomy and is currently on adjuvant chemotherapy with Taxol [name]. She presents to the clinic today for cycle 1.  She is  anxious to get started with treatment.  ALLERGIES:  has No Known Allergies.  MEDICATIONS:  Current Outpatient Medications  Medication Sig Dispense Refill  . esomeprazole (NEXIUM) 20 MG capsule Take 20 mg by mouth daily at 12 noon.    . lidocaine-prilocaine (EMLA) cream Apply to affected area once 30 g 3  . LORazepam (ATIVAN) 0.5 MG tablet Take 1 tablet (0.5 mg total) by mouth at bedtime as needed for sleep. 30 tablet 0  . Multiple Vitamin (MULTIVITAMIN) tablet Take 1 tablet by mouth daily.    . ondansetron (ZOFRAN) 8 MG tablet Take 1 tablet (8 mg total) by mouth every 8 (eight) hours as needed for nausea or vomiting. 20 tablet 0  . prochlorperazine (COMPAZINE) 10 MG tablet Take 1 tablet (10 mg total) by mouth every 6 (six) hours as needed (Nausea or vomiting). 30 tablet 1   No current facility-administered medications for this visit.    PHYSICAL EXAMINATION: ECOG PERFORMANCE STATUS: 1 - Symptomatic but completely ambulatory  Vitals:   10/04/19 0927  BP: (!) 141/88  Pulse: 74  Resp: 18  Temp: 98 F (36.7 C)  SpO2: 97%   Filed Weights   10/04/19 0927  Weight: 190 lb 6.4 oz (86.4 kg)    LABORATORY DATA:  I have reviewed the data as listed CMP Latest Ref Rng & Units 10/04/2019 08/22/2019  Glucose 70 - 99 mg/dL 88 96  BUN 8 - 23 mg/dL 16 13  Creatinine 0.44 - 1.00 mg/dL 0.78 0.78  Sodium 135 - 145 mmol/L 142 143  Potassium 3.5 -  5.1 mmol/L 4.4 4.3  Chloride 98 - 111 mmol/L 109 109  CO2 22 - 32 mmol/L 25 24  Calcium 8.9 - 10.3 mg/dL 9.2 9.5  Total Protein 6.5 - 8.1 g/dL 6.6 6.6  Total Bilirubin 0.3 - 1.2 mg/dL 0.4 0.4  Alkaline Phos 38 - 126 U/L 68 67  AST 15 - 41 U/L 18 25  ALT 0 - 44 U/L 15 25    Lab Results  Component Value Date   WBC 6.2 10/04/2019   HGB 13.9 10/04/2019   HCT 42.5 10/04/2019   MCV 91.4 10/04/2019   PLT 215 10/04/2019   NEUTROABS 3.9 10/04/2019    ASSESSMENT & PLAN:  Malignant neoplasm of 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,  estrogen receptor positive ([place]) 09/07/2019:Right lumpectomy ([name]): IDC with calcifications, grade 2, 1.5cm, clear margins, 2 lymph nodes negative.  ER 100%, PR 70%, HER-2 positive, Ki-67 15% T1c N0 stage Ia  Treatment plan: 1. Adjuvant chemotherapy with Taxol [name] weekly x12 followed by [name] maintenance for 1 year 2.Follow-up adjuvant radiation 3.Follow-up adjuvant antiestrogen therapy ---------------------------------------------------------------------------------------------------------------------------------------------------- Current treatment: Cycle 1 day 1 Taxol [name] Chemo consent obtained, chemo education completed Echocardiogram 08/24/2019: EF 60 to 65%  Return to clinic in 1 week for toxicity check and for cycle 2 Taxol and [name]   No orders of the defined types were placed in this encounter.  The patient has a good understanding of the overall plan. she agrees with it. she will call with any problems that may develop before the next visit here.  Total time spent: 30 mins including face to face time and time spent for planning, charting and coordination of care  Nicholas Lose, MD 10/04/2019  I, Cloyde Reams Dorshimer, am acting as scribe for Dr. Nicholas Lose.  I have reviewed the above documentation for accuracy and completeness, and I agree with the above.

## 2019-10-04 ENCOUNTER — Inpatient Hospital Stay: Payer: Medicare Other

## 2019-10-04 ENCOUNTER — Inpatient Hospital Stay: Payer: Medicare Other | Attending: Hematology and Oncology

## 2019-10-04 ENCOUNTER — Encounter: Payer: Self-pay | Admitting: *Deleted

## 2019-10-04 ENCOUNTER — Other Ambulatory Visit: Payer: Self-pay

## 2019-10-04 ENCOUNTER — Telehealth: Payer: Self-pay | Admitting: *Deleted

## 2019-10-04 ENCOUNTER — Inpatient Hospital Stay (HOSPITAL_BASED_OUTPATIENT_CLINIC_OR_DEPARTMENT_OTHER): Payer: Medicare Other | Admitting: Hematology and Oncology

## 2019-10-04 VITALS — BP 158/79 | HR 87 | Temp 99.0°F | Resp 17

## 2019-10-04 DIAGNOSIS — C50211 Malignant neoplasm of upper-inner quadrant of right female breast: Secondary | ICD-10-CM | POA: Diagnosis present

## 2019-10-04 DIAGNOSIS — Z170 Estrogen receptor positive status [ER+]: Secondary | ICD-10-CM | POA: Insufficient documentation

## 2019-10-04 DIAGNOSIS — Z5111 Encounter for antineoplastic chemotherapy: Secondary | ICD-10-CM | POA: Insufficient documentation

## 2019-10-04 DIAGNOSIS — Z5112 Encounter for antineoplastic immunotherapy: Secondary | ICD-10-CM | POA: Diagnosis present

## 2019-10-04 DIAGNOSIS — Z95828 Presence of other vascular implants and grafts: Secondary | ICD-10-CM

## 2019-10-04 LAB — CMP (CANCER CENTER ONLY)
ALT: 15 U/L (ref 0–44)
AST: 18 U/L (ref 15–41)
Albumin: 3.9 g/dL (ref 3.5–5.0)
Alkaline Phosphatase: 68 U/L (ref 38–126)
Anion gap: 8 (ref 5–15)
BUN: 16 mg/dL (ref 8–23)
CO2: 25 mmol/L (ref 22–32)
Calcium: 9.2 mg/dL (ref 8.9–10.3)
Chloride: 109 mmol/L (ref 98–111)
Creatinine: 0.78 mg/dL (ref 0.44–1.00)
GFR, Est AFR Am: >60 mL/min (ref >60)
GFR, Estimated: >60 mL/min (ref >60)
Glucose, Bld: 88 mg/dL (ref 70–99)
Potassium: 4.4 mmol/L (ref 3.5–5.1)
Sodium: 142 mmol/L (ref 135–145)
Total Bilirubin: 0.4 mg/dL (ref 0.3–1.2)
Total Protein: 6.6 g/dL (ref 6.5–8.1)

## 2019-10-04 LAB — CBC WITH DIFFERENTIAL (CANCER CENTER ONLY)
Abs Immature Granulocytes: 0.02 10*3/uL (ref 0.00–0.07)
Basophils Absolute: 0 10*3/uL (ref 0.0–0.1)
Basophils Relative: 1 %
Eosinophils Absolute: 0.1 10*3/uL (ref 0.0–0.5)
Eosinophils Relative: 1 %
HCT: 42.5 % (ref 36.0–46.0)
Hemoglobin: 13.9 g/dL (ref 12.0–15.0)
Immature Granulocytes: 0 %
Lymphocytes Relative: 28 %
Lymphs Abs: 1.7 10*3/uL (ref 0.7–4.0)
MCH: 29.9 pg (ref 26.0–34.0)
MCHC: 32.7 g/dL (ref 30.0–36.0)
MCV: 91.4 fL (ref 80.0–100.0)
Monocytes Absolute: 0.4 10*3/uL (ref 0.1–1.0)
Monocytes Relative: 7 %
Neutro Abs: 3.9 10*3/uL (ref 1.7–7.7)
Neutrophils Relative %: 63 %
Platelet Count: 215 10*3/uL (ref 150–400)
RBC: 4.65 MIL/uL (ref 3.87–5.11)
RDW: 13.2 % (ref 11.5–15.5)
WBC Count: 6.2 10*3/uL (ref 4.0–10.5)
nRBC: 0 % (ref 0.0–0.2)

## 2019-10-04 MED ORDER — DIPHENHYDRAMINE HCL 50 MG/ML IJ SOLN
INTRAMUSCULAR | Status: AC
  Filled 2019-10-04: qty 1

## 2019-10-04 MED ORDER — TRASTUZUMAB-DKST CHEMO 150 MG IV SOLR
4.0000 mg/kg | Freq: Once | INTRAVENOUS | Status: AC
  Administered 2019-10-04: 357 mg via INTRAVENOUS
  Filled 2019-10-04: qty 17

## 2019-10-04 MED ORDER — SODIUM CHLORIDE 0.9% FLUSH
10.0000 mL | INTRAVENOUS | Status: DC | PRN
  Administered 2019-10-04: 10 mL via INTRAVENOUS
  Filled 2019-10-04: qty 10

## 2019-10-04 MED ORDER — ACETAMINOPHEN 325 MG PO TABS
ORAL_TABLET | ORAL | Status: AC
  Filled 2019-10-04: qty 2

## 2019-10-04 MED ORDER — ACETAMINOPHEN 325 MG PO TABS
650.0000 mg | ORAL_TABLET | Freq: Once | ORAL | Status: AC
  Administered 2019-10-04: 650 mg via ORAL

## 2019-10-04 MED ORDER — DEXAMETHASONE SODIUM PHOSPHATE 10 MG/ML IJ SOLN
INTRAMUSCULAR | Status: AC
  Filled 2019-10-04: qty 1

## 2019-10-04 MED ORDER — FAMOTIDINE IN NACL 20-0.9 MG/50ML-% IV SOLN
20.0000 mg | Freq: Once | INTRAVENOUS | Status: AC
  Administered 2019-10-04: 20 mg via INTRAVENOUS

## 2019-10-04 MED ORDER — SODIUM CHLORIDE 0.9% FLUSH
10.0000 mL | INTRAVENOUS | Status: DC | PRN
  Administered 2019-10-04: 10 mL
  Filled 2019-10-04: qty 10

## 2019-10-04 MED ORDER — SODIUM CHLORIDE 0.9 % IV SOLN
80.0000 mg/m2 | Freq: Once | INTRAVENOUS | Status: AC
  Administered 2019-10-04: 156 mg via INTRAVENOUS
  Filled 2019-10-04: qty 26

## 2019-10-04 MED ORDER — HEPARIN SOD (PORK) LOCK FLUSH 100 UNIT/ML IV SOLN
500.0000 [IU] | Freq: Once | INTRAVENOUS | Status: AC | PRN
  Administered 2019-10-04: 500 [IU]
  Filled 2019-10-04: qty 5

## 2019-10-04 MED ORDER — SODIUM CHLORIDE 0.9 % IV SOLN
Freq: Once | INTRAVENOUS | Status: AC
  Filled 2019-10-04: qty 250

## 2019-10-04 MED ORDER — SODIUM CHLORIDE 0.9 % IV SOLN: 10.0000 mg | Freq: Once | INTRAVENOUS | Status: DC

## 2019-10-04 MED ORDER — DEXAMETHASONE SODIUM PHOSPHATE 10 MG/ML IJ SOLN
10.0000 mg | Freq: Once | INTRAMUSCULAR | Status: AC
  Administered 2019-10-04: 10 mg via INTRAVENOUS

## 2019-10-04 MED ORDER — DIPHENHYDRAMINE HCL 50 MG/ML IJ SOLN
25.0000 mg | Freq: Once | INTRAMUSCULAR | Status: AC
  Administered 2019-10-04: 25 mg via INTRAVENOUS

## 2019-10-04 MED ORDER — FAMOTIDINE IN NACL 20-0.9 MG/50ML-% IV SOLN
INTRAVENOUS | Status: AC
  Filled 2019-10-04: qty 50

## 2019-10-04 NOTE — Patient Instructions (Signed)

## 2019-10-04 NOTE — Assessment & Plan Note (Signed)
09/07/2019:Right lumpectomy [facility]): IDC with calcifications, grade 2, 1.5cm, clear margins, 2 lymph nodes negative.  ER 100%, PR 70%, HER-2 positive, Ki-67 15% T1c N0 stage Ia  Treatment plan: 1. Adjuvant chemotherapy with Taxol [name] weekly x12 followed by [name] maintenance for 1 year 2.Follow-up adjuvant radiation 3.Follow-up adjuvant antiestrogen therapy ---------------------------------------------------------------------------------------------------------------------------------------------------- Current treatment: Cycle 1 day 1 Taxol [name] Chemo consent obtained, chemo education completed Echocardiogram 08/24/2019: EF 60 to 65%  Return to clinic in 1 week for toxicity check and for cycle 2 Taxol and [name]

## 2019-10-04 NOTE — Patient Instructions (Signed)
[place] Discharge Instructions for Patients Receiving Chemotherapy  Today you received the following chemotherapy agents Trastuzumab and Taxol.  To help prevent nausea and vomiting after your treatment, we encourage you to take your nausea medication as directed.   If you develop nausea and vomiting that is not controlled by your nausea medication, call the clinic.   BELOW ARE SYMPTOMS THAT SHOULD BE REPORTED IMMEDIATELY:  *FEVER GREATER THAN 100.5 F  *CHILLS WITH OR WITHOUT FEVER  NAUSEA AND VOMITING THAT IS NOT CONTROLLED WITH YOUR NAUSEA MEDICATION  *UNUSUAL SHORTNESS OF BREATH  *UNUSUAL BRUISING OR BLEEDING  TENDERNESS IN MOUTH AND THROAT WITH OR WITHOUT PRESENCE OF ULCERS  *URINARY PROBLEMS  *BOWEL PROBLEMS  UNUSUAL RASH Items with * indicate a potential emergency and should be followed up as soon as possible.  Feel free to call the clinic you have any questions or concerns. The clinic phone number is [phone].  Please show the [place] at check-in to the Emergency Department and triage nurse.

## 2019-10-05 ENCOUNTER — Telehealth: Payer: Self-pay | Admitting: *Deleted

## 2019-10-05 NOTE — Telephone Encounter (Signed)
-----   Message from Herschell Dimes, RN sent at 10/04/2019  4:00 PM EST ----- Regarding: Amy Harrington - first time call Amy Harrington - Amy Harrington did fantastic today with Trastuzumab and Taxol and cryotherapy: Please call her [phone]

## 2019-10-05 NOTE — Telephone Encounter (Signed)
Called & left message to call back regarding how she is doing post chemotherapy/immunotherapy treatment yesterday.

## 2019-10-09 NOTE — Progress Notes (Signed)
Patient Care Team: Carol Ada, MD as PCP - General (Family Medicine) Rockwell Germany, RN as Oncology Nurse Navigator Tressie Ellis, Paulette Blanch, RN as Oncology Nurse Navigator Nicholas Lose, MD as Consulting Physician (Hematology and Oncology) Kyung Rudd, MD as Consulting Physician (Radiation Oncology) Stark Klein, MD as Consulting Physician (General Surgery) Kyung Rudd, MD as Consulting Physician (Radiation Oncology)  DIAGNOSIS:    ICD-10-CM   1. Malignant neoplasm of 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, estrogen receptor positive ([place])  C50.211    Z17.0     SUMMARY OF ONCOLOGIC HISTORY: Oncology History  Malignant neoplasm of 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, estrogen receptor positive ([place])  08/17/2019 Initial Diagnosis   Routine screening mammogram detected a 0.8cm indeterminate mass in the right breast. Biopsy showed invasive ductal carcinoma, grade 2, HER-2 + by FISH, ER+ 100%, PR+ 70%, Ki67 15%.    08/22/2019 Cancer Staging   Staging form: Breast, AJCC 8th Edition - Clinical stage from 08/22/2019: Stage IA (cT1b, cN0, cM0, G2, ER+, PR+, HER2+) - Signed by Nicholas Lose, MD on 08/22/2019   09/07/2019 Surgery   Right lumpectomy [facility]): IDC with calcifications, grade 2, 1.5cm, clear margins, 2 lymph nodes negative.    10/04/2019 -  Chemotherapy   The patient had PACLitaxel (TAXOL) 156 mg in sodium chloride 0.9 % 250 mL chemo infusion (</= 77m/m2), 80 mg/m2 = 156 mg, Intravenous,  Once, 1 of 3 cycles Administration: 156 mg (10/04/2019) trastuzumab-dkst (OGIVRI) 357 mg in sodium chloride 0.9 % 250 mL chemo infusion, 4 mg/kg = 357 mg, Intravenous,  Once, 1 of 16 cycles Administration: 357 mg (10/04/2019)  for chemotherapy treatment.      CHIEF COMPLIANT: Cycle 2 Taxol [name]  INTERVAL HISTORY: Amy SUITTis a 69y.o. with above-mentioned history of right breast cancer who underwent a right lumpectomy and is currently on adjuvant chemotherapy with  Taxol [name]. She presents to the clinic today for cycle 2 and a toxicity check.   ALLERGIES:  has No Known Allergies.  MEDICATIONS:  Current Outpatient Medications  Medication Sig Dispense Refill  . esomeprazole (NEXIUM) 20 MG capsule Take 20 mg by mouth daily at 12 noon.    . lidocaine-prilocaine (EMLA) cream Apply to affected area once 30 g 3  . LORazepam (ATIVAN) 0.5 MG tablet Take 1 tablet (0.5 mg total) by mouth at bedtime as needed for sleep. 30 tablet 0  . Multiple Vitamin (MULTIVITAMIN) tablet Take 1 tablet by mouth daily.    . ondansetron (ZOFRAN) 8 MG tablet Take 1 tablet (8 mg total) by mouth every 8 (eight) hours as needed for nausea or vomiting. 20 tablet 0  . prochlorperazine (COMPAZINE) 10 MG tablet Take 1 tablet (10 mg total) by mouth every 6 (six) hours as needed (Nausea or vomiting). 30 tablet 1   No current facility-administered medications for this visit.    PHYSICAL EXAMINATION: ECOG PERFORMANCE STATUS: 1 - Symptomatic but completely ambulatory  Vitals:   10/10/19 1331  BP: 128/69  Pulse: 79  Resp: 18  Temp: 98 F (36.7 C)  SpO2: 98%   Filed Weights   10/10/19 1331  Weight: 191 lb 8 oz (86.9 kg)    LABORATORY DATA:  I have reviewed the data as listed CMP Latest Ref Rng & Units 10/04/2019 08/22/2019  Glucose 70 - 99 mg/dL 88 96  BUN 8 - 23 mg/dL 16 13  Creatinine 0.44 - 1.00 mg/dL 0.78 0.78  Sodium 135 - 145 mmol/L 142 143  Potassium 3.5 - 5.1 mmol/L 4.4 4.3  Chloride 98 - 111 mmol/L 109 109  CO2 22 - 32 mmol/L 25 24  Calcium 8.9 - 10.3 mg/dL 9.2 9.5  Total Protein 6.5 - 8.1 g/dL 6.6 6.6  Total Bilirubin 0.3 - 1.2 mg/dL 0.4 0.4  Alkaline Phos 38 - 126 U/L 68 67  AST 15 - 41 U/L 18 25  ALT 0 - 44 U/L 15 25    Lab Results  Component Value Date   WBC 4.9 10/10/2019   HGB 13.1 10/10/2019   HCT 39.6 10/10/2019   MCV 91.7 10/10/2019   PLT 209 10/10/2019   NEUTROABS 2.7 10/10/2019    ASSESSMENT & PLAN:  Malignant neoplasm of upper-inner  quadrant of right breast in female, estrogen receptor positive ([place]) 09/07/2019:Right lumpectomy ([name]): IDC with calcifications, grade 2, 1.5cm, clear margins, 2 lymph nodes negative.ER 100%, PR 70%, HER-2 positive, Ki-67 15% T1c N0 stage Ia  Treatment plan: 1.Adjuvant chemotherapy with Taxol [name] weekly x12 followed by [name] maintenance for 1 year 2.Follow-up adjuvant radiation 3.Follow-up adjuvant antiestrogen therapy ---------------------------------------------------------------------------------------------------------------------------------------------------- Current treatment: Cycle 2 Taxol [name] Echocardiogram 08/24/2019: EF 60 to 65%  Chemo toxicities:  1.  Mild nausea: Did not need no antinausea drugs 2.  Severe sensitivity on the tongue with soreness: I prescribed Magic mouthwash today.  Patient tells me that she has had this problem before whenever she encounters stressful situations. I will check B12 levels today.  Her blood counts look great. Return to clinic weekly for chemo and every other week for follow-up with me.    No orders of the defined types were placed in this encounter.  The patient has a good understanding of the overall plan. she agrees with it. she will call with any problems that may develop before the next visit here.  Total time spent: 30 mins including face to face time and time spent for planning, charting and coordination of care  Nicholas Lose, MD 10/10/2019  I, Cloyde Reams Dorshimer, am acting as scribe for Dr. Nicholas Lose.  I have reviewed the above documentation for accuracy and completeness, and I agree with the above.

## 2019-10-10 ENCOUNTER — Inpatient Hospital Stay: Payer: Medicare Other

## 2019-10-10 ENCOUNTER — Encounter: Payer: Self-pay | Admitting: *Deleted

## 2019-10-10 ENCOUNTER — Inpatient Hospital Stay (HOSPITAL_BASED_OUTPATIENT_CLINIC_OR_DEPARTMENT_OTHER): Payer: Medicare Other | Admitting: Hematology and Oncology

## 2019-10-10 ENCOUNTER — Other Ambulatory Visit: Payer: Self-pay

## 2019-10-10 VITALS — BP 128/69 | HR 79 | Temp 98.0°F | Resp 18 | Ht 63.0 in | Wt 191.5 lb

## 2019-10-10 DIAGNOSIS — C50211 Malignant neoplasm of upper-inner quadrant of right female breast: Secondary | ICD-10-CM

## 2019-10-10 DIAGNOSIS — R5383 Other fatigue: Secondary | ICD-10-CM

## 2019-10-10 DIAGNOSIS — Z170 Estrogen receptor positive status [ER+]: Secondary | ICD-10-CM

## 2019-10-10 DIAGNOSIS — Z5112 Encounter for antineoplastic immunotherapy: Secondary | ICD-10-CM | POA: Diagnosis not present

## 2019-10-10 DIAGNOSIS — Z95828 Presence of other vascular implants and grafts: Secondary | ICD-10-CM

## 2019-10-10 LAB — CBC WITH DIFFERENTIAL (CANCER CENTER ONLY)
Abs Immature Granulocytes: 0.02 10*3/uL (ref 0.00–0.07)
Basophils Absolute: 0 10*3/uL (ref 0.0–0.1)
Basophils Relative: 1 %
Eosinophils Absolute: 0.1 10*3/uL (ref 0.0–0.5)
Eosinophils Relative: 1 %
HCT: 39.6 % (ref 36.0–46.0)
Hemoglobin: 13.1 g/dL (ref 12.0–15.0)
Immature Granulocytes: 0 %
Lymphocytes Relative: 39 %
Lymphs Abs: 1.9 10*3/uL (ref 0.7–4.0)
MCH: 30.3 pg (ref 26.0–34.0)
MCHC: 33.1 g/dL (ref 30.0–36.0)
MCV: 91.7 fL (ref 80.0–100.0)
Monocytes Absolute: 0.2 10*3/uL (ref 0.1–1.0)
Monocytes Relative: 4 %
Neutro Abs: 2.7 10*3/uL (ref 1.7–7.7)
Neutrophils Relative %: 55 %
Platelet Count: 209 10*3/uL (ref 150–400)
RBC: 4.32 MIL/uL (ref 3.87–5.11)
RDW: 13.1 % (ref 11.5–15.5)
WBC Count: 4.9 10*3/uL (ref 4.0–10.5)
nRBC: 0 % (ref 0.0–0.2)

## 2019-10-10 LAB — CMP (CANCER CENTER ONLY)
ALT: 32 U/L (ref 0–44)
AST: 25 U/L (ref 15–41)
Albumin: 3.8 g/dL (ref 3.5–5.0)
Alkaline Phosphatase: 76 U/L (ref 38–126)
Anion gap: 9 (ref 5–15)
BUN: 16 mg/dL (ref 8–23)
CO2: 23 mmol/L (ref 22–32)
Calcium: 8.7 mg/dL — ABNORMAL LOW (ref 8.9–10.3)
Chloride: 109 mmol/L (ref 98–111)
Creatinine: 0.78 mg/dL (ref 0.44–1.00)
GFR, Est AFR Am: >60 mL/min (ref >60)
GFR, Estimated: >60 mL/min (ref >60)
Glucose, Bld: 93 mg/dL (ref 70–99)
Potassium: 4.1 mmol/L (ref 3.5–5.1)
Sodium: 141 mmol/L (ref 135–145)
Total Bilirubin: 0.3 mg/dL (ref 0.3–1.2)
Total Protein: 6.5 g/dL (ref 6.5–8.1)

## 2019-10-10 MED ORDER — TRASTUZUMAB-DKST CHEMO 150 MG IV SOLR
2.0000 mg/kg | Freq: Once | INTRAVENOUS | Status: AC
  Administered 2019-10-10: 168 mg via INTRAVENOUS
  Filled 2019-10-10: qty 8

## 2019-10-10 MED ORDER — FAMOTIDINE IN NACL 20-0.9 MG/50ML-% IV SOLN
20.0000 mg | Freq: Once | INTRAVENOUS | Status: AC
  Administered 2019-10-10: 14:00:00 20 mg via INTRAVENOUS

## 2019-10-10 MED ORDER — SODIUM CHLORIDE 0.9 % IV SOLN
80.0000 mg/m2 | Freq: Once | INTRAVENOUS | Status: AC
  Administered 2019-10-10: 156 mg via INTRAVENOUS
  Filled 2019-10-10: qty 26

## 2019-10-10 MED ORDER — DEXAMETHASONE SODIUM PHOSPHATE 10 MG/ML IJ SOLN
INTRAMUSCULAR | Status: AC
  Filled 2019-10-10: qty 1

## 2019-10-10 MED ORDER — HEPARIN SOD (PORK) LOCK FLUSH 100 UNIT/ML IV SOLN
500.0000 [IU] | Freq: Once | INTRAVENOUS | Status: AC | PRN
  Administered 2019-10-10: 17:00:00 500 [IU]
  Filled 2019-10-10: qty 5

## 2019-10-10 MED ORDER — FAMOTIDINE IN NACL 20-0.9 MG/50ML-% IV SOLN
INTRAVENOUS | Status: AC
  Filled 2019-10-10: qty 50

## 2019-10-10 MED ORDER — DIPHENHYDRAMINE HCL 50 MG/ML IJ SOLN
INTRAMUSCULAR | Status: AC
  Filled 2019-10-10: qty 1

## 2019-10-10 MED ORDER — SODIUM CHLORIDE 0.9 % IV SOLN
Freq: Once | INTRAVENOUS | Status: AC
  Filled 2019-10-10: qty 250

## 2019-10-10 MED ORDER — SODIUM CHLORIDE 0.9% FLUSH
10.0000 mL | INTRAVENOUS | Status: DC | PRN
  Administered 2019-10-10: 17:00:00 10 mL
  Filled 2019-10-10: qty 10

## 2019-10-10 MED ORDER — ACETAMINOPHEN 325 MG PO TABS
650.0000 mg | ORAL_TABLET | Freq: Once | ORAL | Status: AC
  Administered 2019-10-10: 14:00:00 650 mg via ORAL

## 2019-10-10 MED ORDER — SODIUM CHLORIDE 0.9% FLUSH
10.0000 mL | INTRAVENOUS | Status: DC | PRN
  Administered 2019-10-10: 10 mL via INTRAVENOUS
  Filled 2019-10-10: qty 10

## 2019-10-10 MED ORDER — MAGIC MOUTHWASH W/LIDOCAINE: 5.0000 mL | Freq: Three times a day (TID) | ORAL | 3 refills | Status: DC | PRN

## 2019-10-10 MED ORDER — DEXAMETHASONE SODIUM PHOSPHATE 10 MG/ML IJ SOLN
10.0000 mg | Freq: Once | INTRAMUSCULAR | Status: AC
  Administered 2019-10-10: 14:00:00 10 mg via INTRAVENOUS

## 2019-10-10 MED ORDER — DIPHENHYDRAMINE HCL 50 MG/ML IJ SOLN
25.0000 mg | Freq: Once | INTRAMUSCULAR | Status: AC
  Administered 2019-10-10: 14:00:00 25 mg via INTRAVENOUS

## 2019-10-10 MED ORDER — ACETAMINOPHEN 325 MG PO TABS
ORAL_TABLET | ORAL | Status: AC
  Filled 2019-10-10: qty 2

## 2019-10-10 NOTE — Patient Instructions (Signed)
[place] Discharge Instructions for Patients Receiving Chemotherapy  Today you received the following chemotherapy agents Trastuzumab and Taxol.  To help prevent nausea and vomiting after your treatment, we encourage you to take your nausea medication as directed.   If you develop nausea and vomiting that is not controlled by your nausea medication, call the clinic.   BELOW ARE SYMPTOMS THAT SHOULD BE REPORTED IMMEDIATELY:  *FEVER GREATER THAN 100.5 F  *CHILLS WITH OR WITHOUT FEVER  NAUSEA AND VOMITING THAT IS NOT CONTROLLED WITH YOUR NAUSEA MEDICATION  *UNUSUAL SHORTNESS OF BREATH  *UNUSUAL BRUISING OR BLEEDING  TENDERNESS IN MOUTH AND THROAT WITH OR WITHOUT PRESENCE OF ULCERS  *URINARY PROBLEMS  *BOWEL PROBLEMS  UNUSUAL RASH Items with * indicate a potential emergency and should be followed up as soon as possible.  Feel free to call the clinic you have any questions or concerns. The clinic phone number is [phone].  Please show the [place] at check-in to the Emergency Department and triage nurse.

## 2019-10-10 NOTE — Telephone Encounter (Signed)
No entry 

## 2019-10-10 NOTE — Assessment & Plan Note (Signed)
09/07/2019:Right lumpectomy [facility]): IDC with calcifications, grade 2, 1.5cm, clear margins, 2 lymph nodes negative.ER 100%, PR 70%, HER-2 positive, Ki-67 15% T1c N0 stage Ia  Treatment plan: 1.Adjuvant chemotherapy with Taxol [name] weekly x12 followed by [name] maintenance for 1 year 2.Follow-up adjuvant radiation 3.Follow-up adjuvant antiestrogen therapy ---------------------------------------------------------------------------------------------------------------------------------------------------- Current treatment: Cycle 2 Taxol [name] Echocardiogram 08/24/2019: EF 60 to 65%  Chemo toxicities:   Return to clinic weekly for chemo and every other week for follow-up with me.

## 2019-10-17 ENCOUNTER — Other Ambulatory Visit: Payer: Self-pay

## 2019-10-17 ENCOUNTER — Inpatient Hospital Stay: Payer: Medicare Other

## 2019-10-17 VITALS — BP 132/68 | HR 78 | Temp 97.3°F | Resp 16 | Ht 63.0 in | Wt 192.0 lb

## 2019-10-17 DIAGNOSIS — Z170 Estrogen receptor positive status [ER+]: Secondary | ICD-10-CM

## 2019-10-17 DIAGNOSIS — C50211 Malignant neoplasm of upper-inner quadrant of right female breast: Secondary | ICD-10-CM

## 2019-10-17 DIAGNOSIS — R5383 Other fatigue: Secondary | ICD-10-CM

## 2019-10-17 DIAGNOSIS — Z95828 Presence of other vascular implants and grafts: Secondary | ICD-10-CM | POA: Insufficient documentation

## 2019-10-17 DIAGNOSIS — Z5112 Encounter for antineoplastic immunotherapy: Secondary | ICD-10-CM | POA: Diagnosis not present

## 2019-10-17 LAB — CBC WITH DIFFERENTIAL (CANCER CENTER ONLY)
Abs Immature Granulocytes: 0.04 10*3/uL (ref 0.00–0.07)
Basophils Absolute: 0 10*3/uL (ref 0.0–0.1)
Basophils Relative: 1 %
Eosinophils Absolute: 0.1 10*3/uL (ref 0.0–0.5)
Eosinophils Relative: 2 %
HCT: 38.9 % (ref 36.0–46.0)
Hemoglobin: 12.7 g/dL (ref 12.0–15.0)
Immature Granulocytes: 1 %
Lymphocytes Relative: 41 %
Lymphs Abs: 1.8 10*3/uL (ref 0.7–4.0)
MCH: 30 pg (ref 26.0–34.0)
MCHC: 32.6 g/dL (ref 30.0–36.0)
MCV: 92 fL (ref 80.0–100.0)
Monocytes Absolute: 0.3 10*3/uL (ref 0.1–1.0)
Monocytes Relative: 6 %
Neutro Abs: 2.1 10*3/uL (ref 1.7–7.7)
Neutrophils Relative %: 49 %
Platelet Count: 257 10*3/uL (ref 150–400)
RBC: 4.23 MIL/uL (ref 3.87–5.11)
RDW: 13.3 % (ref 11.5–15.5)
WBC Count: 4.3 10*3/uL (ref 4.0–10.5)
nRBC: 0 % (ref 0.0–0.2)

## 2019-10-17 LAB — VITAMIN B12: Vitamin B-12: 395 pg/mL (ref 180–914)

## 2019-10-17 LAB — CMP (CANCER CENTER ONLY)
ALT: 32 U/L (ref 0–44)
AST: 21 U/L (ref 15–41)
Albumin: 3.8 g/dL (ref 3.5–5.0)
Alkaline Phosphatase: 71 U/L (ref 38–126)
Anion gap: 8 (ref 5–15)
BUN: 15 mg/dL (ref 8–23)
CO2: 25 mmol/L (ref 22–32)
Calcium: 9 mg/dL (ref 8.9–10.3)
Chloride: 108 mmol/L (ref 98–111)
Creatinine: 0.78 mg/dL (ref 0.44–1.00)
GFR, Est AFR Am: >60 mL/min (ref >60)
GFR, Estimated: >60 mL/min (ref >60)
Glucose, Bld: 88 mg/dL (ref 70–99)
Potassium: 4.4 mmol/L (ref 3.5–5.1)
Sodium: 141 mmol/L (ref 135–145)
Total Bilirubin: 0.3 mg/dL (ref 0.3–1.2)
Total Protein: 6.5 g/dL (ref 6.5–8.1)

## 2019-10-17 MED ORDER — SODIUM CHLORIDE 0.9 % IV SOLN
80.0000 mg/m2 | Freq: Once | INTRAVENOUS | Status: AC
  Administered 2019-10-17: 156 mg via INTRAVENOUS
  Filled 2019-10-17: qty 26

## 2019-10-17 MED ORDER — SODIUM CHLORIDE 0.9% FLUSH
10.0000 mL | INTRAVENOUS | Status: DC | PRN
  Administered 2019-10-17: 10 mL
  Filled 2019-10-17: qty 10

## 2019-10-17 MED ORDER — DEXAMETHASONE SODIUM PHOSPHATE 10 MG/ML IJ SOLN
INTRAMUSCULAR | Status: AC
  Filled 2019-10-17: qty 1

## 2019-10-17 MED ORDER — FAMOTIDINE IN NACL 20-0.9 MG/50ML-% IV SOLN
INTRAVENOUS | Status: AC
  Filled 2019-10-17: qty 50

## 2019-10-17 MED ORDER — SODIUM CHLORIDE 0.9 % IV SOLN
Freq: Once | INTRAVENOUS | Status: AC
  Filled 2019-10-17: qty 250

## 2019-10-17 MED ORDER — FAMOTIDINE IN NACL 20-0.9 MG/50ML-% IV SOLN
20.0000 mg | Freq: Once | INTRAVENOUS | Status: AC
  Administered 2019-10-17: 20 mg via INTRAVENOUS

## 2019-10-17 MED ORDER — ACETAMINOPHEN 325 MG PO TABS
ORAL_TABLET | ORAL | Status: AC
  Filled 2019-10-17: qty 2

## 2019-10-17 MED ORDER — ACETAMINOPHEN 325 MG PO TABS
650.0000 mg | ORAL_TABLET | Freq: Once | ORAL | Status: AC
  Administered 2019-10-17: 650 mg via ORAL

## 2019-10-17 MED ORDER — DIPHENHYDRAMINE HCL 50 MG/ML IJ SOLN
INTRAMUSCULAR | Status: AC
  Filled 2019-10-17: qty 1

## 2019-10-17 MED ORDER — TRASTUZUMAB-DKST CHEMO 150 MG IV SOLR
2.0000 mg/kg | Freq: Once | INTRAVENOUS | Status: AC
  Administered 2019-10-17: 168 mg via INTRAVENOUS
  Filled 2019-10-17: qty 8

## 2019-10-17 MED ORDER — DIPHENHYDRAMINE HCL 50 MG/ML IJ SOLN
25.0000 mg | Freq: Once | INTRAMUSCULAR | Status: AC
  Administered 2019-10-17: 25 mg via INTRAVENOUS

## 2019-10-17 MED ORDER — HEPARIN SOD (PORK) LOCK FLUSH 100 UNIT/ML IV SOLN
500.0000 [IU] | Freq: Once | INTRAVENOUS | Status: AC | PRN
  Administered 2019-10-17: 500 [IU]
  Filled 2019-10-17: qty 5

## 2019-10-17 MED ORDER — DEXAMETHASONE SODIUM PHOSPHATE 10 MG/ML IJ SOLN
10.0000 mg | Freq: Once | INTRAMUSCULAR | Status: AC
  Administered 2019-10-17: 10 mg via INTRAVENOUS

## 2019-10-17 NOTE — Patient Instructions (Signed)
[facility] Discharge Instructions for Patients Receiving Chemotherapy  Today you received the following chemotherapy agents: Trastuzumab, Taxol  To help prevent nausea and vomiting after your treatment, we encourage you to take your nausea medication as prescribed.   If you develop nausea and vomiting that is not controlled by your nausea medication, call the clinic.   BELOW ARE SYMPTOMS THAT SHOULD BE REPORTED IMMEDIATELY:  *FEVER GREATER THAN 100.5 F  *CHILLS WITH OR WITHOUT FEVER  NAUSEA AND VOMITING THAT IS NOT CONTROLLED WITH YOUR NAUSEA MEDICATION  *UNUSUAL SHORTNESS OF BREATH  *UNUSUAL BRUISING OR BLEEDING  TENDERNESS IN MOUTH AND THROAT WITH OR WITHOUT PRESENCE OF ULCERS  *URINARY PROBLEMS  *BOWEL PROBLEMS  UNUSUAL RASH Items with * indicate a potential emergency and should be followed up as soon as possible.  Feel free to call the clinic should you have any questions or concerns. The clinic phone number is [phone].  Please show the [place] at check-in to the Emergency Department and triage nurse.

## 2019-10-18 ENCOUNTER — Encounter: Payer: Self-pay | Admitting: Hematology and Oncology

## 2019-10-24 ENCOUNTER — Encounter: Payer: Self-pay | Admitting: Hematology and Oncology

## 2019-10-24 NOTE — Progress Notes (Signed)
Patient Care Team: Carol Ada, MD as PCP - General (Family Medicine) Rockwell Germany, RN as Oncology Nurse Navigator Tressie Ellis, Paulette Blanch, RN as Oncology Nurse Navigator Nicholas Lose, MD as Consulting Physician (Hematology and Oncology) Kyung Rudd, MD as Consulting Physician (Radiation Oncology) Stark Klein, MD as Consulting Physician (General Surgery) Kyung Rudd, MD as Consulting Physician (Radiation Oncology)  DIAGNOSIS:    ICD-10-CM   1. Malignant neoplasm of 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, estrogen receptor positive ([place])  C50.211    Z17.0     SUMMARY OF ONCOLOGIC HISTORY: Oncology History  Malignant neoplasm of 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, estrogen receptor positive ([place])  08/17/2019 Initial Diagnosis   Routine screening mammogram detected a 0.8cm indeterminate mass in the right breast. Biopsy showed invasive ductal carcinoma, grade 2, HER-2 + by FISH, ER+ 100%, PR+ 70%, Ki67 15%.    08/22/2019 Cancer Staging   Staging form: Breast, AJCC 8th Edition - Clinical stage from 08/22/2019: Stage IA (cT1b, cN0, cM0, G2, ER+, PR+, HER2+) - Signed by Nicholas Lose, MD on 08/22/2019   09/07/2019 Surgery   Right lumpectomy [facility]): IDC with calcifications, grade 2, 1.5cm, clear margins, 2 lymph nodes negative.    10/04/2019 -  Chemotherapy   The patient had PACLitaxel (TAXOL) 156 mg in sodium chloride 0.9 % 250 mL chemo infusion (</= 62m/m2), 80 mg/m2 = 156 mg, Intravenous,  Once, 1 of 3 cycles Administration: 156 mg (10/04/2019), 156 mg (10/10/2019), 156 mg (10/17/2019) trastuzumab-dkst (OGIVRI) 357 mg in sodium chloride 0.9 % 250 mL chemo infusion, 4 mg/kg = 357 mg, Intravenous,  Once, 1 of 16 cycles Administration: 357 mg (10/04/2019), 168 mg (10/10/2019), 168 mg (10/17/2019)  for chemotherapy treatment.      CHIEF COMPLIANT: Cycle 4 Taxol [name]  INTERVAL HISTORY: Amy ATWOODis a 69y.o. with above-mentioned history of right breast  cancerwhounderwent a right lumpectomyand is currently on adjuvant chemotherapy with Taxol [name].She presents to the clinic todayfor cycle 4 and a toxicity check.    ALLERGIES:  has No Known Allergies.  MEDICATIONS:  Current Outpatient Medications  Medication Sig Dispense Refill  . esomeprazole (NEXIUM) 20 MG capsule Take 20 mg by mouth daily at 12 noon.    . lidocaine-prilocaine (EMLA) cream Apply to affected area once 30 g 3  . LORazepam (ATIVAN) 0.5 MG tablet Take 1 tablet (0.5 mg total) by mouth at bedtime as needed for sleep. 30 tablet 0  . magic mouthwash w/lidocaine SOLN Take 5 mLs by mouth 3 (three) times daily as needed for mouth pain. 200 mL 3  . Multiple Vitamin (MULTIVITAMIN) tablet Take 1 tablet by mouth daily.    . ondansetron (ZOFRAN) 8 MG tablet Take 1 tablet (8 mg total) by mouth every 8 (eight) hours as needed for nausea or vomiting. 20 tablet 0  . prochlorperazine (COMPAZINE) 10 MG tablet Take 1 tablet (10 mg total) by mouth every 6 (six) hours as needed (Nausea or vomiting). 30 tablet 1   No current facility-administered medications for this visit.    PHYSICAL EXAMINATION: ECOG PERFORMANCE STATUS: 1 - Symptomatic but completely ambulatory  Vitals:   10/25/19 1358  BP: (!) 142/68  Pulse: 85  Resp: 18  Temp: 98 F (36.7 C)  SpO2: 99%   Filed Weights   10/25/19 1358  Weight: 194 lb 3.2 oz (88.1 kg)    LABORATORY DATA:  I have reviewed the data as listed CMP Latest Ref Rng & Units 10/17/2019 10/10/2019 10/04/2019  Glucose 70 - 99 mg/dL 88 93 88  BUN 8 - 23 mg/dL 15 16 16   Creatinine 0.44 - 1.00 mg/dL 0.78 0.78 0.78  Sodium 135 - 145 mmol/L 141 141 142  Potassium 3.5 - 5.1 mmol/L 4.4 4.1 4.4  Chloride 98 - 111 mmol/L 108 109 109  CO2 22 - 32 mmol/L 25 23 25   Calcium 8.9 - 10.3 mg/dL 9.0 8.7(L) 9.2  Total Protein 6.5 - 8.1 g/dL 6.5 6.5 6.6  Total Bilirubin 0.3 - 1.2 mg/dL 0.3 0.3 0.4  Alkaline Phos 38 - 126 U/L 71 76 68  AST 15 - 41 U/L 21 25 18    ALT 0 - 44 U/L 32 32 15    Lab Results  Component Value Date   WBC 3.6 (L) 10/25/2019   HGB 12.1 10/25/2019   HCT 36.8 10/25/2019   MCV 92.2 10/25/2019   PLT 247 10/25/2019   NEUTROABS PENDING 10/25/2019    ASSESSMENT & PLAN:  Malignant neoplasm of 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, estrogen receptor positive ([place]) 09/07/2019:Right lumpectomy ([name]): IDC with calcifications, grade 2, 1.5cm, clear margins, 2 lymph nodes negative.ER 100%, PR 70%, HER-2 positive, Ki-67 15% T1c N0 stage Ia  Treatment plan: 1.Adjuvant chemotherapy with Taxol [name] weekly x12 followed by [name] maintenance for 1 year 2.Follow-up adjuvant radiation 3.Follow-up adjuvant antiestrogen therapy ---------------------------------------------------------------------------------------------------------------------------------------------------- Current treatment: Cycle 4 Taxol [name] Echocardiogram 08/24/2019: EF 60 to 65%  Chemo toxicities:  1.  Mild nausea: Stable 2.  Severe sensitivity on the tongue with soreness: Related to stress; given Magic mouthwash which helped B12 level October 16, 2018: 395: Normal 3.  Hair loss: I gave a prescription for a wig 4.  Rash on the chest: Market improvement with cortisone 5.  Leukopenia: We will reduce the dosage of today's chemotherapy.  Monitoring closely for chemo toxicities Return to clinic weekly for chemo and every other week for follow-up with me.   No orders of the defined types were placed in this encounter.  The patient has a good understanding of the overall plan. she agrees with it. she will call with any problems that may develop before the next visit here.  Total time spent: 30 mins including face to face time and time spent for planning, charting and coordination of care  Nicholas Lose, MD 10/25/2019  I, Cloyde Reams Dorshimer, am acting as scribe for Dr. Nicholas Lose.  I have reviewed the above documentation for accuracy  and completeness, and I agree with the above.

## 2019-10-25 ENCOUNTER — Inpatient Hospital Stay: Payer: Medicare Other

## 2019-10-25 ENCOUNTER — Inpatient Hospital Stay (HOSPITAL_BASED_OUTPATIENT_CLINIC_OR_DEPARTMENT_OTHER): Payer: Medicare Other | Admitting: Hematology and Oncology

## 2019-10-25 ENCOUNTER — Other Ambulatory Visit: Payer: Self-pay

## 2019-10-25 ENCOUNTER — Inpatient Hospital Stay: Payer: Medicare Other | Attending: Hematology and Oncology

## 2019-10-25 DIAGNOSIS — Z5112 Encounter for antineoplastic immunotherapy: Secondary | ICD-10-CM | POA: Insufficient documentation

## 2019-10-25 DIAGNOSIS — D701 Agranulocytosis secondary to cancer chemotherapy: Secondary | ICD-10-CM | POA: Diagnosis not present

## 2019-10-25 DIAGNOSIS — C50211 Malignant neoplasm of upper-inner quadrant of right female breast: Secondary | ICD-10-CM | POA: Diagnosis present

## 2019-10-25 DIAGNOSIS — Z170 Estrogen receptor positive status [ER+]: Secondary | ICD-10-CM | POA: Diagnosis not present

## 2019-10-25 DIAGNOSIS — Z95828 Presence of other vascular implants and grafts: Secondary | ICD-10-CM

## 2019-10-25 DIAGNOSIS — Z5111 Encounter for antineoplastic chemotherapy: Secondary | ICD-10-CM | POA: Insufficient documentation

## 2019-10-25 LAB — CMP (CANCER CENTER ONLY)
ALT: 30 U/L (ref 0–44)
AST: 22 U/L (ref 15–41)
Albumin: 3.7 g/dL (ref 3.5–5.0)
Alkaline Phosphatase: 73 U/L (ref 38–126)
Anion gap: 8 (ref 5–15)
BUN: 19 mg/dL (ref 8–23)
CO2: 25 mmol/L (ref 22–32)
Calcium: 9 mg/dL (ref 8.9–10.3)
Chloride: 109 mmol/L (ref 98–111)
Creatinine: 0.75 mg/dL (ref 0.44–1.00)
GFR, Est AFR Am: >60 mL/min (ref >60)
GFR, Estimated: >60 mL/min (ref >60)
Glucose, Bld: 97 mg/dL (ref 70–99)
Potassium: 4.3 mmol/L (ref 3.5–5.1)
Sodium: 142 mmol/L (ref 135–145)
Total Bilirubin: 0.3 mg/dL (ref 0.3–1.2)
Total Protein: 6.4 g/dL — ABNORMAL LOW (ref 6.5–8.1)

## 2019-10-25 LAB — CBC WITH DIFFERENTIAL (CANCER CENTER ONLY)
Abs Immature Granulocytes: 0.12 10*3/uL — ABNORMAL HIGH (ref 0.00–0.07)
Basophils Absolute: 0 10*3/uL (ref 0.0–0.1)
Basophils Relative: 1 %
Eosinophils Absolute: 0.1 10*3/uL (ref 0.0–0.5)
Eosinophils Relative: 1 %
HCT: 36.8 % (ref 36.0–46.0)
Hemoglobin: 12.1 g/dL (ref 12.0–15.0)
Immature Granulocytes: 3 %
Lymphocytes Relative: 42 %
Lymphs Abs: 1.5 10*3/uL (ref 0.7–4.0)
MCH: 30.3 pg (ref 26.0–34.0)
MCHC: 32.9 g/dL (ref 30.0–36.0)
MCV: 92.2 fL (ref 80.0–100.0)
Monocytes Absolute: 0.3 10*3/uL (ref 0.1–1.0)
Monocytes Relative: 9 %
Neutro Abs: 1.6 10*3/uL — ABNORMAL LOW (ref 1.7–7.7)
Neutrophils Relative %: 44 %
Platelet Count: 247 10*3/uL (ref 150–400)
RBC: 3.99 MIL/uL (ref 3.87–5.11)
RDW: 13.8 % (ref 11.5–15.5)
WBC Count: 3.6 10*3/uL — ABNORMAL LOW (ref 4.0–10.5)
nRBC: 0 % (ref 0.0–0.2)

## 2019-10-25 MED ORDER — DIPHENHYDRAMINE HCL 50 MG/ML IJ SOLN
INTRAMUSCULAR | Status: AC
  Filled 2019-10-25: qty 1

## 2019-10-25 MED ORDER — ACETAMINOPHEN 325 MG PO TABS
ORAL_TABLET | ORAL | Status: AC
  Filled 2019-10-25: qty 2

## 2019-10-25 MED ORDER — SODIUM CHLORIDE 0.9% FLUSH
10.0000 mL | INTRAVENOUS | Status: DC | PRN
  Administered 2019-10-25: 10 mL
  Filled 2019-10-25: qty 10

## 2019-10-25 MED ORDER — ACETAMINOPHEN 325 MG PO TABS
650.0000 mg | ORAL_TABLET | Freq: Once | ORAL | Status: AC
  Administered 2019-10-25: 650 mg via ORAL

## 2019-10-25 MED ORDER — DEXAMETHASONE SODIUM PHOSPHATE 10 MG/ML IJ SOLN
INTRAMUSCULAR | Status: AC
  Filled 2019-10-25: qty 1

## 2019-10-25 MED ORDER — DIPHENHYDRAMINE HCL 50 MG/ML IJ SOLN
25.0000 mg | Freq: Once | INTRAMUSCULAR | Status: AC
  Administered 2019-10-25: 25 mg via INTRAVENOUS

## 2019-10-25 MED ORDER — DEXAMETHASONE SODIUM PHOSPHATE 10 MG/ML IJ SOLN
10.0000 mg | Freq: Once | INTRAMUSCULAR | Status: AC
  Administered 2019-10-25: 10 mg via INTRAVENOUS

## 2019-10-25 MED ORDER — SODIUM CHLORIDE 0.9 % IV SOLN
Freq: Once | INTRAVENOUS | Status: AC
  Filled 2019-10-25: qty 250

## 2019-10-25 MED ORDER — FAMOTIDINE IN NACL 20-0.9 MG/50ML-% IV SOLN
INTRAVENOUS | Status: AC
  Filled 2019-10-25: qty 50

## 2019-10-25 MED ORDER — FAMOTIDINE IN NACL 20-0.9 MG/50ML-% IV SOLN
20.0000 mg | Freq: Once | INTRAVENOUS | Status: AC
  Administered 2019-10-25: 20 mg via INTRAVENOUS

## 2019-10-25 MED ORDER — HEPARIN SOD (PORK) LOCK FLUSH 100 UNIT/ML IV SOLN
500.0000 [IU] | Freq: Once | INTRAVENOUS | Status: AC | PRN
  Administered 2019-10-25: 500 [IU]
  Filled 2019-10-25: qty 5

## 2019-10-25 MED ORDER — TRASTUZUMAB-DKST CHEMO 150 MG IV SOLR
2.0000 mg/kg | Freq: Once | INTRAVENOUS | Status: AC
  Administered 2019-10-25: 168 mg via INTRAVENOUS
  Filled 2019-10-25: qty 8

## 2019-10-25 MED ORDER — SODIUM CHLORIDE 0.9 % IV SOLN
70.0000 mg/m2 | Freq: Once | INTRAVENOUS | Status: AC
  Administered 2019-10-25: 138 mg via INTRAVENOUS
  Filled 2019-10-25: qty 23

## 2019-10-25 NOTE — Patient Instructions (Signed)
[facility] Discharge Instructions for Patients Receiving Chemotherapy  Today you received the following chemotherapy agents: Trastuzumab, Taxol  To help prevent nausea and vomiting after your treatment, we encourage you to take your nausea medication as prescribed.   If you develop nausea and vomiting that is not controlled by your nausea medication, call the clinic.   BELOW ARE SYMPTOMS THAT SHOULD BE REPORTED IMMEDIATELY:  *FEVER GREATER THAN 100.5 F  *CHILLS WITH OR WITHOUT FEVER  NAUSEA AND VOMITING THAT IS NOT CONTROLLED WITH YOUR NAUSEA MEDICATION  *UNUSUAL SHORTNESS OF BREATH  *UNUSUAL BRUISING OR BLEEDING  TENDERNESS IN MOUTH AND THROAT WITH OR WITHOUT PRESENCE OF ULCERS  *URINARY PROBLEMS  *BOWEL PROBLEMS  UNUSUAL RASH Items with * indicate a potential emergency and should be followed up as soon as possible.  Feel free to call the clinic should you have any questions or concerns. The clinic phone number is [phone].  Please show the [name] at check-in to the Emergency Department and triage nurse.

## 2019-10-25 NOTE — Assessment & Plan Note (Signed)
09/07/2019:Right lumpectomy [facility]): IDC with calcifications, grade 2, 1.5cm, clear margins, 2 lymph nodes negative.ER 100%, PR 70%, HER-2 positive, Ki-67 15% T1c N0 stage Ia  Treatment plan: 1.Adjuvant chemotherapy with Taxol [name] weekly x12 followed by [name] maintenance for 1 year 2.Follow-up adjuvant radiation 3.Follow-up adjuvant antiestrogen therapy ---------------------------------------------------------------------------------------------------------------------------------------------------- Current treatment: Cycle 4 Taxol [name] Echocardiogram 08/24/2019: EF 60 to 65%  Chemo toxicities:  1.  Mild nausea: Stable 2.  Severe sensitivity on the tongue with soreness: Related to stress B12 level October 16, 2018: 395: Normal  Monitoring closely for chemo toxicities Return to clinic weekly for chemo and every other week for follow-up with me.

## 2019-11-01 ENCOUNTER — Other Ambulatory Visit: Payer: Self-pay

## 2019-11-01 ENCOUNTER — Inpatient Hospital Stay: Payer: Medicare Other

## 2019-11-01 VITALS — BP 126/59 | HR 83 | Temp 97.8°F | Resp 16

## 2019-11-01 DIAGNOSIS — Z5112 Encounter for antineoplastic immunotherapy: Secondary | ICD-10-CM | POA: Diagnosis not present

## 2019-11-01 DIAGNOSIS — C50211 Malignant neoplasm of upper-inner quadrant of right female breast: Secondary | ICD-10-CM

## 2019-11-01 DIAGNOSIS — Z170 Estrogen receptor positive status [ER+]: Secondary | ICD-10-CM

## 2019-11-01 DIAGNOSIS — Z95828 Presence of other vascular implants and grafts: Secondary | ICD-10-CM

## 2019-11-01 LAB — CBC WITH DIFFERENTIAL (CANCER CENTER ONLY)
Abs Immature Granulocytes: 0.08 10*3/uL — ABNORMAL HIGH (ref 0.00–0.07)
Basophils Absolute: 0 10*3/uL (ref 0.0–0.1)
Basophils Relative: 1 %
Eosinophils Absolute: 0.1 10*3/uL (ref 0.0–0.5)
Eosinophils Relative: 1 %
HCT: 36 % (ref 36.0–46.0)
Hemoglobin: 11.8 g/dL — ABNORMAL LOW (ref 12.0–15.0)
Immature Granulocytes: 2 %
Lymphocytes Relative: 40 %
Lymphs Abs: 1.7 10*3/uL (ref 0.7–4.0)
MCH: 30.3 pg (ref 26.0–34.0)
MCHC: 32.8 g/dL (ref 30.0–36.0)
MCV: 92.3 fL (ref 80.0–100.0)
Monocytes Absolute: 0.3 10*3/uL (ref 0.1–1.0)
Monocytes Relative: 7 %
Neutro Abs: 2.1 10*3/uL (ref 1.7–7.7)
Neutrophils Relative %: 49 %
Platelet Count: 266 10*3/uL (ref 150–400)
RBC: 3.9 MIL/uL (ref 3.87–5.11)
RDW: 14.2 % (ref 11.5–15.5)
WBC Count: 4.2 10*3/uL (ref 4.0–10.5)
nRBC: 0 % (ref 0.0–0.2)

## 2019-11-01 LAB — CMP (CANCER CENTER ONLY)
ALT: 39 U/L (ref 0–44)
AST: 25 U/L (ref 15–41)
Albumin: 3.6 g/dL (ref 3.5–5.0)
Alkaline Phosphatase: 73 U/L (ref 38–126)
Anion gap: 6 (ref 5–15)
BUN: 16 mg/dL (ref 8–23)
CO2: 24 mmol/L (ref 22–32)
Calcium: 8.8 mg/dL — ABNORMAL LOW (ref 8.9–10.3)
Chloride: 109 mmol/L (ref 98–111)
Creatinine: 0.78 mg/dL (ref 0.44–1.00)
GFR, Est AFR Am: >60 mL/min (ref >60)
GFR, Estimated: >60 mL/min (ref >60)
Glucose, Bld: 114 mg/dL — ABNORMAL HIGH (ref 70–99)
Potassium: 4.1 mmol/L (ref 3.5–5.1)
Sodium: 139 mmol/L (ref 135–145)
Total Bilirubin: 0.4 mg/dL (ref 0.3–1.2)
Total Protein: 6.2 g/dL — ABNORMAL LOW (ref 6.5–8.1)

## 2019-11-01 MED ORDER — HEPARIN SOD (PORK) LOCK FLUSH 100 UNIT/ML IV SOLN
500.0000 [IU] | Freq: Once | INTRAVENOUS | Status: AC | PRN
  Administered 2019-11-01: 500 [IU]
  Filled 2019-11-01: qty 5

## 2019-11-01 MED ORDER — SODIUM CHLORIDE 0.9% FLUSH
10.0000 mL | INTRAVENOUS | Status: DC | PRN
  Administered 2019-11-01: 10 mL
  Filled 2019-11-01: qty 10

## 2019-11-01 MED ORDER — SODIUM CHLORIDE 0.9 % IV SOLN
70.0000 mg/m2 | Freq: Once | INTRAVENOUS | Status: AC
  Administered 2019-11-01: 138 mg via INTRAVENOUS
  Filled 2019-11-01: qty 23

## 2019-11-01 MED ORDER — DEXAMETHASONE SODIUM PHOSPHATE 10 MG/ML IJ SOLN
10.0000 mg | Freq: Once | INTRAMUSCULAR | Status: AC
  Administered 2019-11-01: 14:00:00 10 mg via INTRAVENOUS

## 2019-11-01 MED ORDER — ACETAMINOPHEN 325 MG PO TABS
ORAL_TABLET | ORAL | Status: AC
  Filled 2019-11-01: qty 2

## 2019-11-01 MED ORDER — FAMOTIDINE IN NACL 20-0.9 MG/50ML-% IV SOLN
INTRAVENOUS | Status: AC
  Filled 2019-11-01: qty 50

## 2019-11-01 MED ORDER — DEXAMETHASONE SODIUM PHOSPHATE 10 MG/ML IJ SOLN
INTRAMUSCULAR | Status: AC
  Filled 2019-11-01: qty 1

## 2019-11-01 MED ORDER — FAMOTIDINE IN NACL 20-0.9 MG/50ML-% IV SOLN
20.0000 mg | Freq: Once | INTRAVENOUS | Status: AC
  Administered 2019-11-01: 14:00:00 20 mg via INTRAVENOUS

## 2019-11-01 MED ORDER — SODIUM CHLORIDE 0.9 % IV SOLN
Freq: Once | INTRAVENOUS | Status: AC
  Filled 2019-11-01: qty 250

## 2019-11-01 MED ORDER — TRASTUZUMAB-DKST CHEMO 150 MG IV SOLR
2.0000 mg/kg | Freq: Once | INTRAVENOUS | Status: AC
  Administered 2019-11-01: 168 mg via INTRAVENOUS
  Filled 2019-11-01: qty 8

## 2019-11-01 MED ORDER — ACETAMINOPHEN 325 MG PO TABS
650.0000 mg | ORAL_TABLET | Freq: Once | ORAL | Status: AC
  Administered 2019-11-01: 650 mg via ORAL

## 2019-11-01 MED ORDER — DIPHENHYDRAMINE HCL 50 MG/ML IJ SOLN
INTRAMUSCULAR | Status: AC
  Filled 2019-11-01: qty 1

## 2019-11-01 MED ORDER — DIPHENHYDRAMINE HCL 50 MG/ML IJ SOLN
25.0000 mg | Freq: Once | INTRAMUSCULAR | Status: AC
  Administered 2019-11-01: 14:00:00 25 mg via INTRAVENOUS

## 2019-11-01 NOTE — Patient Instructions (Signed)
[facility] Discharge Instructions for Patients Receiving Chemotherapy  Today you received the following chemotherapy agents:  Trastuzumab, Paclitaxel  To help prevent nausea and vomiting after your treatment, we encourage you to take your nausea medication as prescribed.   If you develop nausea and vomiting that is not controlled by your nausea medication, call the clinic.   BELOW ARE SYMPTOMS THAT SHOULD BE REPORTED IMMEDIATELY:  *FEVER GREATER THAN 100.5 F  *CHILLS WITH OR WITHOUT FEVER  NAUSEA AND VOMITING THAT IS NOT CONTROLLED WITH YOUR NAUSEA MEDICATION  *UNUSUAL SHORTNESS OF BREATH  *UNUSUAL BRUISING OR BLEEDING  TENDERNESS IN MOUTH AND THROAT WITH OR WITHOUT PRESENCE OF ULCERS  *URINARY PROBLEMS  *BOWEL PROBLEMS  UNUSUAL RASH Items with * indicate a potential emergency and should be followed up as soon as possible.  Feel free to call the clinic should you have any questions or concerns. The clinic phone number is [phone].  Please show the [place] at check-in to the Emergency Department and triage nurse.

## 2019-11-01 NOTE — Patient Instructions (Signed)

## 2019-11-07 ENCOUNTER — Encounter: Payer: Self-pay | Admitting: *Deleted

## 2019-11-07 ENCOUNTER — Encounter: Payer: Self-pay | Admitting: Hematology and Oncology

## 2019-11-08 ENCOUNTER — Ambulatory Visit: Payer: Medicare Other | Admitting: Hematology and Oncology

## 2019-11-08 ENCOUNTER — Other Ambulatory Visit: Payer: Medicare Other

## 2019-11-08 ENCOUNTER — Ambulatory Visit: Payer: Medicare Other

## 2019-11-08 NOTE — Progress Notes (Signed)
Everyone is here  Patient Care Team: Carol Ada, MD as PCP - General (Family Medicine) Rockwell Germany, RN as Oncology Nurse Navigator Mauro Kaufmann, RN as Oncology Nurse Navigator Nicholas Lose, MD as Consulting Physician (Hematology and Oncology) Kyung Rudd, MD as Consulting Physician (Radiation Oncology) Stark Klein, MD as Consulting Physician (General Surgery) Kyung Rudd, MD as Consulting Physician (Radiation Oncology)  DIAGNOSIS:    ICD-10-CM   1. Malignant neoplasm of 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, estrogen receptor positive ([place])  C50.211    Z17.0     SUMMARY OF ONCOLOGIC HISTORY: Oncology History  Malignant neoplasm of 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, estrogen receptor positive ([place])  08/17/2019 Initial Diagnosis   Routine screening mammogram detected a 0.8cm indeterminate mass in the right breast. Biopsy showed invasive ductal carcinoma, grade 2, HER-2 + by FISH, ER+ 100%, PR+ 70%, Ki67 15%.    08/22/2019 Cancer Staging   Staging form: Breast, AJCC 8th Edition - Clinical stage from 08/22/2019: Stage IA (cT1b, cN0, cM0, G2, ER+, PR+, HER2+) - Signed by Nicholas Lose, MD on 08/22/2019   09/07/2019 Surgery   Right lumpectomy [facility]): IDC with calcifications, grade 2, 1.5cm, clear margins, 2 lymph nodes negative.    10/04/2019 -  Chemotherapy   The patient had PACLitaxel (TAXOL) 156 mg in sodium chloride 0.9 % 250 mL chemo infusion (</= 63m/m2), 80 mg/m2 = 156 mg, Intravenous,  Once, 2 of 3 cycles Dose modification: 70 mg/m2 (original dose 80 mg/m2, Cycle 2, Reason: Dose not tolerated) Administration: 156 mg (10/04/2019), 156 mg (10/10/2019), 138 mg (11/01/2019), 156 mg (10/17/2019), 138 mg (10/25/2019) trastuzumab-dkst (OGIVRI) 357 mg in sodium chloride 0.9 % 250 mL chemo infusion, 4 mg/kg = 357 mg, Intravenous,  Once, 2 of 16 cycles Administration: 357 mg (10/04/2019), 168 mg (10/10/2019), 168 mg (11/01/2019), 168 mg (10/17/2019), 168 mg  (10/25/2019)  for chemotherapy treatment.      CHIEF COMPLIANT: Cycle6Taxol [name]  INTERVAL HISTORY: Amy RUACHOis a 69y.o. with above-mentioned history of right breast cancerwhounderwent a right lumpectomyand is currently on adjuvant chemotherapy with Taxol [name].She presents to the clinic todayfor cycle6 and a toxicity check.   Very mild peripheral neuropathy in the fingers  ALLERGIES:  has No Known Allergies.  MEDICATIONS:  Current Outpatient Medications  Medication Sig Dispense Refill  . esomeprazole (NEXIUM) 20 MG capsule Take 20 mg by mouth daily at 12 noon.    . lidocaine-prilocaine (EMLA) cream Apply to affected area once 30 g 3  . LORazepam (ATIVAN) 0.5 MG tablet Take 1 tablet (0.5 mg total) by mouth at bedtime as needed for sleep. 30 tablet 0  . magic mouthwash w/lidocaine SOLN Take 5 mLs by mouth 3 (three) times daily as needed for mouth pain. 200 mL 3  . Multiple Vitamin (MULTIVITAMIN) tablet Take 1 tablet by mouth daily.    . ondansetron (ZOFRAN) 8 MG tablet Take 1 tablet (8 mg total) by mouth every 8 (eight) hours as needed for nausea or vomiting. 20 tablet 0  . prochlorperazine (COMPAZINE) 10 MG tablet Take 1 tablet (10 mg total) by mouth every 6 (six) hours as needed (Nausea or vomiting). 30 tablet 1   No current facility-administered medications for this visit.    PHYSICAL EXAMINATION: ECOG PERFORMANCE STATUS: 1 - Symptomatic but completely ambulatory  Vitals:   11/09/19 1051  BP: 121/62  Pulse: 82  Resp: 18  Temp: 98.8 F (37.1 C)  SpO2: 98%   Filed Weights  11/09/19 1051  Weight: 192 lb 1.6 oz (87.1 kg)    LABORATORY DATA:  I have reviewed the data as listed CMP Latest Ref Rng & Units 11/01/2019 10/25/2019 10/17/2019  Glucose 70 - 99 mg/dL 114(H) 97 88  BUN 8 - 23 mg/dL 16 19 15   Creatinine 0.44 - 1.00 mg/dL 0.78 0.75 0.78  Sodium 135 - 145 mmol/L 139 142 141  Potassium 3.5 - 5.1 mmol/L 4.1 4.3 4.4  Chloride 98 - 111 mmol/L 109 109  108  CO2 22 - 32 mmol/L 24 25 25   Calcium 8.9 - 10.3 mg/dL 8.8(L) 9.0 9.0  Total Protein 6.5 - 8.1 g/dL 6.2(L) 6.4(L) 6.5  Total Bilirubin 0.3 - 1.2 mg/dL 0.4 0.3 0.3  Alkaline Phos 38 - 126 U/L 73 73 71  AST 15 - 41 U/L 25 22 21   ALT 0 - 44 U/L 39 30 32    Lab Results  Component Value Date   WBC 4.2 11/09/2019   HGB 11.7 (L) 11/09/2019   HCT 35.5 (L) 11/09/2019   MCV 91.7 11/09/2019   PLT 245 11/09/2019   NEUTROABS 2.3 11/09/2019    ASSESSMENT & PLAN:  Malignant neoplasm of 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, estrogen receptor positive ([place]) 09/07/2019:Right lumpectomy ([name]): IDC with calcifications, grade 2, 1.5cm, clear margins, 2 lymph nodes negative.ER 100%, PR 70%, HER-2 positive, Ki-67 15% T1c N0 stage Ia  Treatment plan: 1.Adjuvant chemotherapy with Taxol [name] weekly x12 followed by [name] maintenance for 1 year 2.Follow-up adjuvant radiation 3.Follow-up adjuvant antiestrogen therapy ---------------------------------------------------------------------------------------------------------------------------------------------------- Current treatment:Cycle 6Taxol [name] Echocardiogram 08/24/2019: EF 60 to 65%  Chemo toxicities: 1.Mild nausea: Improved 2.Severe sensitivity on the tongue with soreness: Related to stress; given Magic mouthwash which helped B12 level October 16, 2018: 395: Normal 3.  Hair loss 4.  Rash on the chest: Stable 5.  Leukopenia: Dose reduced  Monitoring closely for chemo toxicities Return to clinicweekly for chemo and every other week for follow-up with me.     No orders of the defined types were placed in this encounter.  The patient has a good understanding of the overall plan. she agrees with it. she will call with any problems that may develop before the next visit here.  Total time spent: 30 mins including face to face time and time spent for planning, charting and coordination of  care  Nicholas Lose, MD 11/09/2019  I, Cloyde Reams Dorshimer, am acting as scribe for Dr. Nicholas Lose.  I have reviewed the above documentation for accuracy and completeness, and I agree with the above.

## 2019-11-08 NOTE — Assessment & Plan Note (Deleted)
09/07/2019:Right lumpectomy ([name]): IDC with calcifications, grade 2, 1.5cm, clear margins, 2 lymph nodes negative.ER 100%, PR 70%, HER-2 positive, Ki-67 15% T1c N0 stage Ia  Treatment plan: 1.Adjuvant chemotherapy with Taxol [name] weekly x12 followed by [name] maintenance for 1 year 2.Follow-up adjuvant radiation 3.Follow-up adjuvant antiestrogen therapy ---------------------------------------------------------------------------------------------------------------------------------------------------- Current treatment:Cycle 6Taxol [name] Echocardiogram 08/24/2019: EF 60 to 65%  Chemo toxicities: 1.Mild nausea: Improved 2.Severe sensitivity on the tongue with soreness: Related to stress; given Magic mouthwash which helped B12 level October 16, 2018: 395: Normal 3.  Hair loss 4.  Rash on the chest: Stable 5.  Leukopenia: Dose reduced  Monitoring closely for chemo toxicities Return to clinicweekly for chemo and every other week for follow-up with me.  

## 2019-11-09 ENCOUNTER — Inpatient Hospital Stay: Payer: Medicare Other

## 2019-11-09 ENCOUNTER — Inpatient Hospital Stay (HOSPITAL_BASED_OUTPATIENT_CLINIC_OR_DEPARTMENT_OTHER): Payer: Medicare Other | Admitting: Hematology and Oncology

## 2019-11-09 ENCOUNTER — Other Ambulatory Visit: Payer: Self-pay

## 2019-11-09 DIAGNOSIS — Z5112 Encounter for antineoplastic immunotherapy: Secondary | ICD-10-CM | POA: Diagnosis not present

## 2019-11-09 DIAGNOSIS — Z170 Estrogen receptor positive status [ER+]: Secondary | ICD-10-CM

## 2019-11-09 DIAGNOSIS — C50211 Malignant neoplasm of upper-inner quadrant of right female breast: Secondary | ICD-10-CM

## 2019-11-09 LAB — CBC WITH DIFFERENTIAL (CANCER CENTER ONLY)
Abs Immature Granulocytes: 0.09 10*3/uL — ABNORMAL HIGH (ref 0.00–0.07)
Basophils Absolute: 0 10*3/uL (ref 0.0–0.1)
Basophils Relative: 1 %
Eosinophils Absolute: 0.1 10*3/uL (ref 0.0–0.5)
Eosinophils Relative: 2 %
HCT: 35.5 % — ABNORMAL LOW (ref 36.0–46.0)
Hemoglobin: 11.7 g/dL — ABNORMAL LOW (ref 12.0–15.0)
Immature Granulocytes: 2 %
Lymphocytes Relative: 34 %
Lymphs Abs: 1.4 10*3/uL (ref 0.7–4.0)
MCH: 30.2 pg (ref 26.0–34.0)
MCHC: 33 g/dL (ref 30.0–36.0)
MCV: 91.7 fL (ref 80.0–100.0)
Monocytes Absolute: 0.3 10*3/uL (ref 0.1–1.0)
Monocytes Relative: 8 %
Neutro Abs: 2.3 10*3/uL (ref 1.7–7.7)
Neutrophils Relative %: 53 %
Platelet Count: 245 10*3/uL (ref 150–400)
RBC: 3.87 MIL/uL (ref 3.87–5.11)
RDW: 14.4 % (ref 11.5–15.5)
WBC Count: 4.2 10*3/uL (ref 4.0–10.5)
nRBC: 0 % (ref 0.0–0.2)

## 2019-11-09 LAB — CMP (CANCER CENTER ONLY)
ALT: 29 U/L (ref 0–44)
AST: 22 U/L (ref 15–41)
Albumin: 3.8 g/dL (ref 3.5–5.0)
Alkaline Phosphatase: 66 U/L (ref 38–126)
Anion gap: 7 (ref 5–15)
BUN: 17 mg/dL (ref 8–23)
CO2: 25 mmol/L (ref 22–32)
Calcium: 9.1 mg/dL (ref 8.9–10.3)
Chloride: 109 mmol/L (ref 98–111)
Creatinine: 0.74 mg/dL (ref 0.44–1.00)
GFR, Est AFR Am: >60 mL/min
GFR, Estimated: >60 mL/min
Glucose, Bld: 81 mg/dL (ref 70–99)
Potassium: 4.2 mmol/L (ref 3.5–5.1)
Sodium: 141 mmol/L (ref 135–145)
Total Bilirubin: 0.5 mg/dL (ref 0.3–1.2)
Total Protein: 6.3 g/dL — ABNORMAL LOW (ref 6.5–8.1)

## 2019-11-09 MED ORDER — DEXAMETHASONE SODIUM PHOSPHATE 10 MG/ML IJ SOLN
INTRAMUSCULAR | Status: AC
  Filled 2019-11-09: qty 1

## 2019-11-09 MED ORDER — DIPHENHYDRAMINE HCL 50 MG/ML IJ SOLN
25.0000 mg | Freq: Once | INTRAMUSCULAR | Status: AC
  Administered 2019-11-09: 25 mg via INTRAVENOUS

## 2019-11-09 MED ORDER — FAMOTIDINE IN NACL 20-0.9 MG/50ML-% IV SOLN
INTRAVENOUS | Status: AC
  Filled 2019-11-09: qty 50

## 2019-11-09 MED ORDER — DEXAMETHASONE SODIUM PHOSPHATE 10 MG/ML IJ SOLN
10.0000 mg | Freq: Once | INTRAMUSCULAR | Status: AC
  Administered 2019-11-09: 10 mg via INTRAVENOUS

## 2019-11-09 MED ORDER — FAMOTIDINE IN NACL 20-0.9 MG/50ML-% IV SOLN
20.0000 mg | Freq: Once | INTRAVENOUS | Status: AC
  Administered 2019-11-09: 20 mg via INTRAVENOUS

## 2019-11-09 MED ORDER — HEPARIN SOD (PORK) LOCK FLUSH 100 UNIT/ML IV SOLN
500.0000 [IU] | Freq: Once | INTRAVENOUS | Status: AC | PRN
  Administered 2019-11-09: 500 [IU]
  Filled 2019-11-09: qty 5

## 2019-11-09 MED ORDER — ACETAMINOPHEN 325 MG PO TABS
650.0000 mg | ORAL_TABLET | Freq: Once | ORAL | Status: AC
  Administered 2019-11-09: 650 mg via ORAL

## 2019-11-09 MED ORDER — SODIUM CHLORIDE 0.9 % IV SOLN
Freq: Once | INTRAVENOUS | Status: AC
  Filled 2019-11-09: qty 250

## 2019-11-09 MED ORDER — TRASTUZUMAB-DKST CHEMO 150 MG IV SOLR
2.0000 mg/kg | Freq: Once | INTRAVENOUS | Status: AC
  Administered 2019-11-09: 168 mg via INTRAVENOUS
  Filled 2019-11-09: qty 8

## 2019-11-09 MED ORDER — ACETAMINOPHEN 325 MG PO TABS
ORAL_TABLET | ORAL | Status: AC
  Filled 2019-11-09: qty 2

## 2019-11-09 MED ORDER — DIPHENHYDRAMINE HCL 50 MG/ML IJ SOLN
INTRAMUSCULAR | Status: AC
  Filled 2019-11-09: qty 1

## 2019-11-09 MED ORDER — SODIUM CHLORIDE 0.9 % IV SOLN
70.0000 mg/m2 | Freq: Once | INTRAVENOUS | Status: AC
  Administered 2019-11-09: 138 mg via INTRAVENOUS
  Filled 2019-11-09: qty 23

## 2019-11-09 MED ORDER — SODIUM CHLORIDE 0.9% FLUSH
10.0000 mL | INTRAVENOUS | Status: DC | PRN
  Administered 2019-11-09: 10 mL
  Filled 2019-11-09: qty 10

## 2019-11-09 NOTE — Patient Instructions (Signed)
[facility] Discharge Instructions for Patients Receiving Chemotherapy  Today you received the following chemotherapy agents:  Trastuzumab, Paclitaxel  To help prevent nausea and vomiting after your treatment, we encourage you to take your nausea medication as prescribed.   If you develop nausea and vomiting that is not controlled by your nausea medication, call the clinic.   BELOW ARE SYMPTOMS THAT SHOULD BE REPORTED IMMEDIATELY:  *FEVER GREATER THAN 100.5 F  *CHILLS WITH OR WITHOUT FEVER  NAUSEA AND VOMITING THAT IS NOT CONTROLLED WITH YOUR NAUSEA MEDICATION  *UNUSUAL SHORTNESS OF BREATH  *UNUSUAL BRUISING OR BLEEDING  TENDERNESS IN MOUTH AND THROAT WITH OR WITHOUT PRESENCE OF ULCERS  *URINARY PROBLEMS  *BOWEL PROBLEMS  UNUSUAL RASH Items with * indicate a potential emergency and should be followed up as soon as possible.  Feel free to call the clinic should you have any questions or concerns. The clinic phone number is [phone].  Please show the [place] at check-in to the Emergency Department and triage nurse.

## 2019-11-09 NOTE — Assessment & Plan Note (Signed)
09/07/2019:Right lumpectomy ([name]): IDC with calcifications, grade 2, 1.5cm, clear margins, 2 lymph nodes negative.ER 100%, PR 70%, HER-2 positive, Ki-67 15% T1c N0 stage Ia  Treatment plan: 1.Adjuvant chemotherapy with Taxol [name] weekly x12 followed by [name] maintenance for 1 year 2.Follow-up adjuvant radiation 3.Follow-up adjuvant antiestrogen therapy ---------------------------------------------------------------------------------------------------------------------------------------------------- Current treatment:Cycle 6Taxol [name] Echocardiogram 08/24/2019: EF 60 to 65%  Chemo toxicities: 1.Mild nausea: Improved 2.Severe sensitivity on the tongue with soreness: Related to stress; given Magic mouthwash which helped B12 level October 16, 2018: 395: Normal 3.  Hair loss 4.  Rash on the chest: Stable 5.  Leukopenia: Dose reduced  Monitoring closely for chemo toxicities Return to clinicweekly for chemo and every other week for follow-up with me.  

## 2019-11-12 ENCOUNTER — Telehealth: Payer: Self-pay | Admitting: Hematology and Oncology

## 2019-11-12 NOTE — Telephone Encounter (Signed)
I talk with patient regarding schedule  

## 2019-11-15 ENCOUNTER — Other Ambulatory Visit: Payer: Self-pay

## 2019-11-15 ENCOUNTER — Encounter: Payer: Self-pay | Admitting: *Deleted

## 2019-11-15 ENCOUNTER — Inpatient Hospital Stay: Payer: Medicare Other

## 2019-11-15 VITALS — BP 129/83 | HR 82 | Temp 98.3°F | Resp 16 | Wt 192.5 lb

## 2019-11-15 DIAGNOSIS — Z5112 Encounter for antineoplastic immunotherapy: Secondary | ICD-10-CM | POA: Diagnosis not present

## 2019-11-15 DIAGNOSIS — C50211 Malignant neoplasm of upper-inner quadrant of right female breast: Secondary | ICD-10-CM

## 2019-11-15 LAB — CBC WITH DIFFERENTIAL (CANCER CENTER ONLY)
Abs Immature Granulocytes: 0.08 10*3/uL — ABNORMAL HIGH (ref 0.00–0.07)
Basophils Absolute: 0 10*3/uL (ref 0.0–0.1)
Basophils Relative: 1 %
Eosinophils Absolute: 0.1 10*3/uL (ref 0.0–0.5)
Eosinophils Relative: 2 %
HCT: 34.9 % — ABNORMAL LOW (ref 36.0–46.0)
Hemoglobin: 11.5 g/dL — ABNORMAL LOW (ref 12.0–15.0)
Immature Granulocytes: 2 %
Lymphocytes Relative: 44 %
Lymphs Abs: 1.9 10*3/uL (ref 0.7–4.0)
MCH: 30.4 pg (ref 26.0–34.0)
MCHC: 33 g/dL (ref 30.0–36.0)
MCV: 92.3 fL (ref 80.0–100.0)
Monocytes Absolute: 0.3 10*3/uL (ref 0.1–1.0)
Monocytes Relative: 6 %
Neutro Abs: 2 10*3/uL (ref 1.7–7.7)
Neutrophils Relative %: 45 %
Platelet Count: 227 10*3/uL (ref 150–400)
RBC: 3.78 MIL/uL — ABNORMAL LOW (ref 3.87–5.11)
RDW: 14.6 % (ref 11.5–15.5)
WBC Count: 4.3 10*3/uL (ref 4.0–10.5)
nRBC: 0 % (ref 0.0–0.2)

## 2019-11-15 LAB — CMP (CANCER CENTER ONLY)
ALT: 33 U/L (ref 0–44)
AST: 23 U/L (ref 15–41)
Albumin: 3.7 g/dL (ref 3.5–5.0)
Alkaline Phosphatase: 65 U/L (ref 38–126)
Anion gap: 7 (ref 5–15)
BUN: 13 mg/dL (ref 8–23)
CO2: 27 mmol/L (ref 22–32)
Calcium: 8.7 mg/dL — ABNORMAL LOW (ref 8.9–10.3)
Chloride: 108 mmol/L (ref 98–111)
Creatinine: 0.73 mg/dL (ref 0.44–1.00)
GFR, Est AFR Am: >60 mL/min (ref >60)
GFR, Estimated: >60 mL/min (ref >60)
Glucose, Bld: 81 mg/dL (ref 70–99)
Potassium: 3.8 mmol/L (ref 3.5–5.1)
Sodium: 142 mmol/L (ref 135–145)
Total Bilirubin: 0.5 mg/dL (ref 0.3–1.2)
Total Protein: 6.2 g/dL — ABNORMAL LOW (ref 6.5–8.1)

## 2019-11-15 MED ORDER — SODIUM CHLORIDE 0.9 % IV SOLN
Freq: Once | INTRAVENOUS | Status: AC
  Filled 2019-11-15: qty 250

## 2019-11-15 MED ORDER — ACETAMINOPHEN 325 MG PO TABS
ORAL_TABLET | ORAL | Status: AC
  Filled 2019-11-15: qty 2

## 2019-11-15 MED ORDER — DEXAMETHASONE SODIUM PHOSPHATE 10 MG/ML IJ SOLN
10.0000 mg | Freq: Once | INTRAMUSCULAR | Status: AC
  Administered 2019-11-15: 10 mg via INTRAVENOUS

## 2019-11-15 MED ORDER — TRASTUZUMAB-DKST CHEMO 150 MG IV SOLR
2.0000 mg/kg | Freq: Once | INTRAVENOUS | Status: AC
  Administered 2019-11-15: 168 mg via INTRAVENOUS
  Filled 2019-11-15: qty 8

## 2019-11-15 MED ORDER — ACETAMINOPHEN 325 MG PO TABS
650.0000 mg | ORAL_TABLET | Freq: Once | ORAL | Status: AC
  Administered 2019-11-15: 650 mg via ORAL

## 2019-11-15 MED ORDER — FAMOTIDINE IN NACL 20-0.9 MG/50ML-% IV SOLN
INTRAVENOUS | Status: AC
  Filled 2019-11-15: qty 50

## 2019-11-15 MED ORDER — DIPHENHYDRAMINE HCL 50 MG/ML IJ SOLN
INTRAMUSCULAR | Status: AC
  Filled 2019-11-15: qty 1

## 2019-11-15 MED ORDER — HEPARIN SOD (PORK) LOCK FLUSH 100 UNIT/ML IV SOLN
500.0000 [IU] | Freq: Once | INTRAVENOUS | Status: AC | PRN
  Administered 2019-11-15: 500 [IU]
  Filled 2019-11-15: qty 5

## 2019-11-15 MED ORDER — SODIUM CHLORIDE 0.9% FLUSH
10.0000 mL | INTRAVENOUS | Status: DC | PRN
  Administered 2019-11-15: 16:00:00 10 mL
  Filled 2019-11-15: qty 10

## 2019-11-15 MED ORDER — DEXAMETHASONE SODIUM PHOSPHATE 10 MG/ML IJ SOLN
INTRAMUSCULAR | Status: AC
  Filled 2019-11-15: qty 1

## 2019-11-15 MED ORDER — DIPHENHYDRAMINE HCL 50 MG/ML IJ SOLN
25.0000 mg | Freq: Once | INTRAMUSCULAR | Status: AC
  Administered 2019-11-15: 25 mg via INTRAVENOUS

## 2019-11-15 MED ORDER — FAMOTIDINE IN NACL 20-0.9 MG/50ML-% IV SOLN
20.0000 mg | Freq: Once | INTRAVENOUS | Status: AC
  Administered 2019-11-15: 20 mg via INTRAVENOUS

## 2019-11-15 MED ORDER — SODIUM CHLORIDE 0.9 % IV SOLN
70.0000 mg/m2 | Freq: Once | INTRAVENOUS | Status: AC
  Administered 2019-11-15: 138 mg via INTRAVENOUS
  Filled 2019-11-15: qty 23

## 2019-11-15 NOTE — Patient Instructions (Addendum)
[facility] Discharge Instructions for Patients Receiving Chemotherapy  Today you received the following Immunotherapy agent: Trastuzumab, and Chemotherapy agent: Paclitaxel  To help prevent nausea and vomiting after your treatment, we encourage you to take your nausea medication as directed by your MD.   If you develop nausea and vomiting that is not controlled by your nausea medication, call the clinic.   BELOW ARE SYMPTOMS THAT SHOULD BE REPORTED IMMEDIATELY:  *FEVER GREATER THAN 100.5 F  *CHILLS WITH OR WITHOUT FEVER  NAUSEA AND VOMITING THAT IS NOT CONTROLLED WITH YOUR NAUSEA MEDICATION  *UNUSUAL SHORTNESS OF BREATH  *UNUSUAL BRUISING OR BLEEDING  TENDERNESS IN MOUTH AND THROAT WITH OR WITHOUT PRESENCE OF ULCERS  *URINARY PROBLEMS  *BOWEL PROBLEMS  UNUSUAL RASH Items with * indicate a potential emergency and should be followed up as soon as possible.  Feel free to call the clinic should you have any questions or concerns. The clinic phone number is [phone].  Please show the [facility] at check-in to the Emergency Department and triage nurse.  Coronavirus (COVID-19) Are you at risk?  Are you at risk for the Coronavirus (COVID-19)?  To be considered HIGH RISK for Coronavirus (COVID-19), you have to meet the following criteria:  . Traveled to [place], [place], [place], [place] or [place]; or in the [place] to [place], [place], [place], or [place]; and have fever, cough, and shortness of breath within the last 2 weeks of travel OR . Been in close contact with a person diagnosed with COVID-19 within the last 2 weeks and have fever, cough, and shortness of breath . IF YOU DO NOT MEET THESE CRITERIA, YOU ARE CONSIDERED LOW RISK FOR COVID-19.  What to do if you are HIGH RISK for COVID-19?  Marland Kitchen If you are having a medical emergency, call 911. . Seek medical care right away. Before you go to a doctor's office, urgent care or emergency  department, call ahead and tell them about your recent travel, contact with someone diagnosed with COVID-19, and your symptoms. You should receive instructions from your physician's office regarding next steps of care.  . When you arrive at healthcare provider, tell the healthcare staff immediately you have returned from visiting [place], [place], [place], [place] or [place]; or traveled in the [place] to [place], [place], [place], or [place]; in the last two weeks or you have been in close contact with a person diagnosed with COVID-19 in the last 2 weeks.   . Tell the health care staff about your symptoms: fever, cough and shortness of breath. . After you have been seen by a medical provider, you will be either: o Tested for (COVID-19) and discharged home on quarantine except to seek medical care if symptoms worsen, and asked to  - Stay home and avoid contact with others until you get your results (4-5 days)  - Avoid travel on public transportation if possible (such as bus, train, or airplane) or o Sent to the Emergency Department by EMS for evaluation, COVID-19 testing, and possible admission depending on your condition and test results.  What to do if you are LOW RISK for COVID-19?  Reduce your risk of any infection by using the same precautions used for avoiding the common cold or flu:  Marland Kitchen Wash your hands often with soap and warm water for at least 20 seconds.  If soap and water are not readily available, use an alcohol-based hand sanitizer with at least 60%  alcohol.  . If coughing or sneezing, cover your mouth and nose by coughing or sneezing into the elbow areas of your shirt or coat, into a tissue or into your sleeve (not your hands). . Avoid shaking hands with others and consider head nods or verbal greetings only. . Avoid touching your eyes, nose, or mouth with unwashed hands.  . Avoid close contact with people who are sick. . Avoid places or events with large numbers of people  in one location, like concerts or sporting events. . Carefully consider travel plans you have or are making. . If you are planning any travel outside or inside the [place], visit the CDC's Travelers' Health webpage for the latest health notices. . If you have some symptoms but not all symptoms, continue to monitor at home and seek medical attention if your symptoms worsen. . If you are having a medical emergency, call 911.   [place] / e-Visit: [email or web address]         [facility]: [facility]: [phone]                   [facility]: [phone]

## 2019-11-21 ENCOUNTER — Telehealth (HOSPITAL_COMMUNITY): Payer: Self-pay

## 2019-11-21 NOTE — Telephone Encounter (Signed)

## 2019-11-22 ENCOUNTER — Inpatient Hospital Stay: Payer: Medicare Other

## 2019-11-22 ENCOUNTER — Other Ambulatory Visit: Payer: Self-pay

## 2019-11-22 ENCOUNTER — Ambulatory Visit (HOSPITAL_BASED_OUTPATIENT_CLINIC_OR_DEPARTMENT_OTHER)
Admission: RE | Admit: 2019-11-22 | Discharge: 2019-11-22 | Disposition: A | Discharge status: Home / Self Care | Payer: Medicare Other | Source: Ambulatory Visit | Attending: Internal Medicine | Admitting: Internal Medicine

## 2019-11-22 ENCOUNTER — Encounter: Payer: Self-pay | Admitting: Adult Health

## 2019-11-22 ENCOUNTER — Inpatient Hospital Stay (HOSPITAL_BASED_OUTPATIENT_CLINIC_OR_DEPARTMENT_OTHER): Payer: Medicare Other | Admitting: Adult Health

## 2019-11-22 ENCOUNTER — Inpatient Hospital Stay: Payer: Medicare Other | Attending: Hematology and Oncology

## 2019-11-22 ENCOUNTER — Ambulatory Visit (HOSPITAL_COMMUNITY)
Admission: RE | Admit: 2019-11-22 | Discharge: 2019-11-22 | Disposition: A | Discharge status: Home / Self Care | Payer: Medicare Other | Source: Ambulatory Visit | Attending: Internal Medicine | Admitting: Internal Medicine

## 2019-11-22 ENCOUNTER — Encounter (HOSPITAL_COMMUNITY): Payer: Self-pay | Admitting: Internal Medicine

## 2019-11-22 ENCOUNTER — Other Ambulatory Visit: Payer: Medicare Other

## 2019-11-22 VITALS — BP 130/80 | HR 90 | Wt 194.2 lb

## 2019-11-22 VITALS — BP 120/52 | HR 83 | Temp 98.3°F | Resp 18 | Ht 63.0 in | Wt 191.8 lb

## 2019-11-22 DIAGNOSIS — Z5111 Encounter for antineoplastic chemotherapy: Secondary | ICD-10-CM | POA: Insufficient documentation

## 2019-11-22 DIAGNOSIS — I351 Nonrheumatic aortic (valve) insufficiency: Secondary | ICD-10-CM | POA: Diagnosis not present

## 2019-11-22 DIAGNOSIS — Z170 Estrogen receptor positive status [ER+]: Secondary | ICD-10-CM

## 2019-11-22 DIAGNOSIS — C50911 Malignant neoplasm of unspecified site of right female breast: Secondary | ICD-10-CM | POA: Insufficient documentation

## 2019-11-22 DIAGNOSIS — C50211 Malignant neoplasm of upper-inner quadrant of right female breast: Secondary | ICD-10-CM | POA: Diagnosis present

## 2019-11-22 DIAGNOSIS — I509 Heart failure, unspecified: Secondary | ICD-10-CM | POA: Insufficient documentation

## 2019-11-22 DIAGNOSIS — I5022 Chronic systolic (congestive) heart failure: Secondary | ICD-10-CM | POA: Diagnosis not present

## 2019-11-22 DIAGNOSIS — Z5112 Encounter for antineoplastic immunotherapy: Secondary | ICD-10-CM | POA: Diagnosis present

## 2019-11-22 DIAGNOSIS — Z79899 Other long term (current) drug therapy: Secondary | ICD-10-CM | POA: Diagnosis not present

## 2019-11-22 DIAGNOSIS — Z853 Personal history of malignant neoplasm of breast: Secondary | ICD-10-CM | POA: Insufficient documentation

## 2019-11-22 DIAGNOSIS — K219 Gastro-esophageal reflux disease without esophagitis: Secondary | ICD-10-CM | POA: Insufficient documentation

## 2019-11-22 LAB — CBC WITH DIFFERENTIAL (CANCER CENTER ONLY)
Abs Immature Granulocytes: 0.13 10*3/uL — ABNORMAL HIGH (ref 0.00–0.07)
Basophils Absolute: 0 10*3/uL (ref 0.0–0.1)
Basophils Relative: 1 %
Eosinophils Absolute: 0.1 10*3/uL (ref 0.0–0.5)
Eosinophils Relative: 2 %
HCT: 36 % (ref 36.0–46.0)
Hemoglobin: 12.1 g/dL (ref 12.0–15.0)
Immature Granulocytes: 3 %
Lymphocytes Relative: 29 %
Lymphs Abs: 1.4 10*3/uL (ref 0.7–4.0)
MCH: 30.6 pg (ref 26.0–34.0)
MCHC: 33.6 g/dL (ref 30.0–36.0)
MCV: 91.1 fL (ref 80.0–100.0)
Monocytes Absolute: 0.3 10*3/uL (ref 0.1–1.0)
Monocytes Relative: 6 %
Neutro Abs: 3.1 10*3/uL (ref 1.7–7.7)
Neutrophils Relative %: 59 %
Platelet Count: 263 10*3/uL (ref 150–400)
RBC: 3.95 MIL/uL (ref 3.87–5.11)
RDW: 15.1 % (ref 11.5–15.5)
WBC Count: 5 10*3/uL (ref 4.0–10.5)
nRBC: 0 % (ref 0.0–0.2)

## 2019-11-22 LAB — CMP (CANCER CENTER ONLY)
ALT: 30 U/L (ref 0–44)
AST: 23 U/L (ref 15–41)
Albumin: 3.8 g/dL (ref 3.5–5.0)
Alkaline Phosphatase: 66 U/L (ref 38–126)
Anion gap: 7 (ref 5–15)
BUN: 19 mg/dL (ref 8–23)
CO2: 25 mmol/L (ref 22–32)
Calcium: 9.3 mg/dL (ref 8.9–10.3)
Chloride: 108 mmol/L (ref 98–111)
Creatinine: 0.79 mg/dL (ref 0.44–1.00)
GFR, Est AFR Am: >60 mL/min (ref >60)
GFR, Estimated: >60 mL/min (ref >60)
Glucose, Bld: 88 mg/dL (ref 70–99)
Potassium: 4.3 mmol/L (ref 3.5–5.1)
Sodium: 140 mmol/L (ref 135–145)
Total Bilirubin: 0.4 mg/dL (ref 0.3–1.2)
Total Protein: 6.2 g/dL — ABNORMAL LOW (ref 6.5–8.1)

## 2019-11-22 LAB — ECHOCARDIOGRAM COMPLETE
Height: 63 in
Weight: 3068.8 oz

## 2019-11-22 MED ORDER — SODIUM CHLORIDE 0.9 % IV SOLN
Freq: Once | INTRAVENOUS | Status: AC
  Filled 2019-11-22: qty 250

## 2019-11-22 MED ORDER — SODIUM CHLORIDE 0.9 % IV SOLN
70.0000 mg/m2 | Freq: Once | INTRAVENOUS | Status: AC
  Administered 2019-11-22: 12:00:00 138 mg via INTRAVENOUS
  Filled 2019-11-22: qty 23

## 2019-11-22 MED ORDER — HEPARIN SOD (PORK) LOCK FLUSH 100 UNIT/ML IV SOLN
500.0000 [IU] | Freq: Once | INTRAVENOUS | Status: AC | PRN
  Administered 2019-11-22: 500 [IU]
  Filled 2019-11-22: qty 5

## 2019-11-22 MED ORDER — ACETAMINOPHEN 325 MG PO TABS
ORAL_TABLET | ORAL | Status: AC
  Filled 2019-11-22: qty 2

## 2019-11-22 MED ORDER — FAMOTIDINE IN NACL 20-0.9 MG/50ML-% IV SOLN
INTRAVENOUS | Status: AC
  Filled 2019-11-22: qty 50

## 2019-11-22 MED ORDER — FAMOTIDINE IN NACL 20-0.9 MG/50ML-% IV SOLN
20.0000 mg | Freq: Once | INTRAVENOUS | Status: AC
  Administered 2019-11-22: 20 mg via INTRAVENOUS

## 2019-11-22 MED ORDER — TRASTUZUMAB-DKST CHEMO 150 MG IV SOLR
2.0000 mg/kg | Freq: Once | INTRAVENOUS | Status: AC
  Administered 2019-11-22: 168 mg via INTRAVENOUS
  Filled 2019-11-22: qty 8

## 2019-11-22 MED ORDER — DEXAMETHASONE SODIUM PHOSPHATE 10 MG/ML IJ SOLN
10.0000 mg | Freq: Once | INTRAMUSCULAR | Status: AC
  Administered 2019-11-22: 10 mg via INTRAVENOUS

## 2019-11-22 MED ORDER — SODIUM CHLORIDE 0.9% FLUSH
10.0000 mL | INTRAVENOUS | Status: DC | PRN
  Administered 2019-11-22: 10 mL
  Filled 2019-11-22: qty 10

## 2019-11-22 MED ORDER — DEXAMETHASONE SODIUM PHOSPHATE 10 MG/ML IJ SOLN
INTRAMUSCULAR | Status: AC
  Filled 2019-11-22: qty 1

## 2019-11-22 MED ORDER — DIPHENHYDRAMINE HCL 50 MG/ML IJ SOLN
INTRAMUSCULAR | Status: AC
  Filled 2019-11-22: qty 1

## 2019-11-22 MED ORDER — DIPHENHYDRAMINE HCL 50 MG/ML IJ SOLN
25.0000 mg | Freq: Once | INTRAMUSCULAR | Status: AC
  Administered 2019-11-22: 25 mg via INTRAVENOUS

## 2019-11-22 MED ORDER — ACETAMINOPHEN 325 MG PO TABS
650.0000 mg | ORAL_TABLET | Freq: Once | ORAL | Status: AC
  Administered 2019-11-22: 650 mg via ORAL

## 2019-11-22 NOTE — Patient Instructions (Signed)
[facility] Discharge Instructions for Patients Receiving Chemotherapy  Today you received the following Immunotherapy agent: Trastuzumab, and Chemotherapy agent: Paclitaxel  To help prevent nausea and vomiting after your treatment, we encourage you to take your nausea medication as directed by your MD.   If you develop nausea and vomiting that is not controlled by your nausea medication, call the clinic.   BELOW ARE SYMPTOMS THAT SHOULD BE REPORTED IMMEDIATELY:  *FEVER GREATER THAN 100.5 F  *CHILLS WITH OR WITHOUT FEVER  NAUSEA AND VOMITING THAT IS NOT CONTROLLED WITH YOUR NAUSEA MEDICATION  *UNUSUAL SHORTNESS OF BREATH  *UNUSUAL BRUISING OR BLEEDING  TENDERNESS IN MOUTH AND THROAT WITH OR WITHOUT PRESENCE OF ULCERS  *URINARY PROBLEMS  *BOWEL PROBLEMS  UNUSUAL RASH Items with * indicate a potential emergency and should be followed up as soon as possible.  Feel free to call the clinic should you have any questions or concerns. The clinic phone number is [phone].  Please show the [facility] at check-in to the Emergency Department and triage nurse.  Coronavirus (COVID-19) Are you at risk?  Are you at risk for the Coronavirus (COVID-19)?  To be considered HIGH RISK for Coronavirus (COVID-19), you have to meet the following criteria:  . Traveled to [place], [place], [place], [place] or [place]; or in the [place] to [place], [place], [place], or [place]; and have fever, cough, and shortness of breath within the last 2 weeks of travel OR . Been in close contact with a person diagnosed with COVID-19 within the last 2 weeks and have fever, cough, and shortness of breath . IF YOU DO NOT MEET THESE CRITERIA, YOU ARE CONSIDERED LOW RISK FOR COVID-19.  What to do if you are HIGH RISK for COVID-19?  Marland Kitchen If you are having a medical emergency, call 911. . Seek medical care right away. Before you go to a doctor's office, urgent care or emergency  department, call ahead and tell them about your recent travel, contact with someone diagnosed with COVID-19, and your symptoms. You should receive instructions from your physician's office regarding next steps of care.  . When you arrive at healthcare provider, tell the healthcare staff immediately you have returned from visiting [place], [place], [place], [place] or [place]; or traveled in the [place] to [place], [place], [place], or [place]; in the last two weeks or you have been in close contact with a person diagnosed with COVID-19 in the last 2 weeks.   . Tell the health care staff about your symptoms: fever, cough and shortness of breath. . After you have been seen by a medical provider, you will be either: o Tested for (COVID-19) and discharged home on quarantine except to seek medical care if symptoms worsen, and asked to  - Stay home and avoid contact with others until you get your results (4-5 days)  - Avoid travel on public transportation if possible (such as bus, train, or airplane) or o Sent to the Emergency Department by EMS for evaluation, COVID-19 testing, and possible admission depending on your condition and test results.  What to do if you are LOW RISK for COVID-19?  Reduce your risk of any infection by using the same precautions used for avoiding the common cold or flu:  Marland Kitchen Wash your hands often with soap and warm water for at least 20 seconds.  If soap and water are not readily available, use an alcohol-based hand sanitizer with at least 60%  alcohol.  . If coughing or sneezing, cover your mouth and nose by coughing or sneezing into the elbow areas of your shirt or coat, into a tissue or into your sleeve (not your hands). . Avoid shaking hands with others and consider head nods or verbal greetings only. . Avoid touching your eyes, nose, or mouth with unwashed hands.  . Avoid close contact with people who are sick. . Avoid places or events with large numbers of people  in one location, like concerts or sporting events. . Carefully consider travel plans you have or are making. . If you are planning any travel outside or inside the [place], visit the CDC's Travelers' Health webpage for the latest health notices. . If you have some symptoms but not all symptoms, continue to monitor at home and seek medical attention if your symptoms worsen. . If you are having a medical emergency, call 911.   [place] / e-Visit: [email or web address]         [facility]: [facility]: [phone]                   [facility]: [phone]

## 2019-11-22 NOTE — Patient Instructions (Signed)
Your physician recommends that you schedule a follow-up appointment in: 3 months with echocardiogram  

## 2019-11-22 NOTE — Assessment & Plan Note (Signed)
09/07/2019:Right lumpectomy [facility]): IDC with calcifications, grade 2, 1.5cm, clear margins, 2 lymph nodes negative.ER 100%, PR 70%, HER-2 positive, Ki-67 15% T1c N0 stage Ia  Treatment plan: 1.Adjuvant chemotherapy with Taxol [name] weekly x12 followed by [name] maintenance for 1 year 2.Follow-up adjuvant radiation 3.Follow-up adjuvant antiestrogen therapy ---------------------------------------------------------------------------------------------------------------------------------------------------- Current treatment:Cycle 8Taxol [name] Echocardiogram 08/24/2019: EF 60 to 65% Echocardiogram on 11/22/2019: (results pending)  Chemo toxicities: 1.Mild nausea: resolved 2.Mucositis: resolved, has magic mouthwash B12 level October 16, 2018: 395: Normal 3.  Hair loss 4.  Rash on the chest: Stable 5.  Leukopenia: Dose reduced, WBC normal today  Monitoring closely for chemo toxicities Return to clinicweekly for chemo and every other week for follow-up with Dr. Lindi Adie or oncology APP.

## 2019-11-22 NOTE — Progress Notes (Signed)
[facility] Follow up:    Amy Ada, MD [address] [place] [address]   DIAGNOSIS: Cancer Staging Malignant neoplasm of 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, estrogen receptor positive ([facility]) Staging form: Breast, AJCC 8th Edition - Clinical stage from 08/22/2019: Stage IA (cT1b, cN0, cM0, G2, ER+, PR+, HER2+) - Signed by Nicholas Lose, MD on 08/22/2019   SUMMARY OF ONCOLOGIC HISTORY: Oncology History  Malignant neoplasm of 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, estrogen receptor positive ([facility])  08/17/2019 Initial Diagnosis   Routine screening mammogram detected a 0.8cm indeterminate mass in the right breast. Biopsy showed invasive ductal carcinoma, grade 2, HER-2 + by FISH, ER+ 100%, PR+ 70%, Ki67 15%.    08/22/2019 Cancer Staging   Staging form: Breast, AJCC 8th Edition - Clinical stage from 08/22/2019: Stage IA (cT1b, cN0, cM0, G2, ER+, PR+, HER2+) - Signed by Nicholas Lose, MD on 08/22/2019   09/07/2019 Surgery   Right lumpectomy [facility]): IDC with calcifications, grade 2, 1.5cm, clear margins, 2 lymph nodes negative.    10/04/2019 -  Chemotherapy   The patient had PACLitaxel (TAXOL) 156 mg in sodium chloride 0.9 % 250 mL chemo infusion (</= 34m/m2), 80 mg/m2 = 156 mg, Intravenous,  Once, 2 of 3 cycles Dose modification: 70 mg/m2 (original dose 80 mg/m2, Cycle 2, Reason: Dose not tolerated) Administration: 156 mg (10/04/2019), 156 mg (10/10/2019), 138 mg (11/01/2019), 156 mg (10/17/2019), 138 mg (10/25/2019), 138 mg (11/09/2019), 138 mg (11/15/2019) trastuzumab-dkst (OGIVRI) 357 mg in sodium chloride 0.9 % 250 mL chemo infusion, 4 mg/kg = 357 mg, Intravenous,  Once, 2 of 16 cycles Administration: 357 mg (10/04/2019), 168 mg (10/10/2019), 168 mg (11/01/2019), 168 mg (10/17/2019), 168 mg (10/25/2019), 168 mg (11/09/2019), 168 mg (11/15/2019)  for chemotherapy treatment.      CURRENT THERAPY: Taxol/[name]  INTERVAL HISTORY: Amy GREENSTEIN89y.o. female returns for evaluation prior to receiving weekly adjuvant Taxol and [name].  She notes that she is fatigued, and has had some mouth sores that she has had to deal with, however she is delighted that she has not had any nausea, vomiting, or other major side effects.  She has intermittent peripheral neuropathy in her fingertips that comes and goes and is currently resolved.     Patient Active Problem List   Diagnosis Date Noted  . Port-A-Cath in place 10/17/2019  . Malignant neoplasm of 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, estrogen receptor positive ([place] 08/17/2019    has No Known Allergies.  MEDICAL HISTORY: Past Medical History:  Diagnosis Date  . GERD (gastroesophageal reflux disease)   . Plantar fasciitis   . PONV (postoperative nausea and vomiting)     SURGICAL HISTORY: Past Surgical History:  Procedure Laterality Date  . BREAST LUMPECTOMY WITH RADIOACTIVE SEED AND SENTINEL LYMPH NODE BIOPSY Right 09/07/2019   Procedure: RIGHT BREAST LUMPECTOMY WITH RADIOACTIVE SEED AND SENTINEL LYMPH NODE BIOPSY;  Surgeon: BStark Klein MD;  [facility]  Service: General;  Laterality: Right;  . HERNIA REPAIR    . PORTACATH PLACEMENT Left 09/07/2019   Procedure: INSERTION PORT-A-CATH WITH ULTRASOUND;  Surgeon: BStark Klein MD;  [facility]  Service: General;  Laterality: Left;    SOCIAL HISTORY: Social History   Socioeconomic History  . Marital status: Widowed    Spouse name: Not on file  . Number of children: Not on file  . Years of education: Not on file  .  Highest education level: Not on file  Occupational History  . Not on file  Tobacco Use  . Smoking status: Never Smoker  . Smokeless tobacco: Never Used  Substance and Sexual Activity  . Alcohol use: Not Currently  . Drug use: Never  . Sexual activity: Not on file  Other Topics Concern  . Not on file  Social History Narrative  . Not on  file   Social Determinants of Health   Financial Resource Strain:   . Difficulty of Paying Living Expenses: Not on file  Food Insecurity:   . Worried About Charity fundraiser in the Last Year: Not on file  . Ran Out of Food in the Last Year: Not on file  Transportation Needs:   . Lack of Transportation (Medical): Not on file  . Lack of Transportation (Non-Medical): Not on file  Physical Activity:   . Days of Exercise per Week: Not on file  . Minutes of Exercise per Session: Not on file  Stress:   . Feeling of Stress : Not on file  Social Connections:   . Frequency of Communication with Friends and Family: Not on file  . Frequency of Social Gatherings with Friends and Family: Not on file  . Attends Religious Services: Not on file  . Active Member of Clubs or Organizations: Not on file  . Attends Archivist Meetings: Not on file  . Marital Status: Not on file  Intimate Partner Violence:   . Fear of Current or Ex-Partner: Not on file  . Emotionally Abused: Not on file  . Physically Abused: Not on file  . Sexually Abused: Not on file    FAMILY HISTORY: Family History  Problem Relation Age of Onset  . Stomach cancer Father 22    Review of Systems  Constitutional: Positive for fatigue. Negative for appetite change, chills and unexpected weight change.  HENT:   Positive for mouth sores. Negative for hearing loss and lump/mass.   Eyes: Negative for eye problems and icterus.  Respiratory: Negative for chest tightness, cough and shortness of breath.   Cardiovascular: Negative for chest pain, leg swelling and palpitations.  Gastrointestinal: Negative for abdominal distention, abdominal pain, constipation, diarrhea, nausea and vomiting.  Endocrine: Negative for hot flashes.  Musculoskeletal: Negative for arthralgias.  Skin: Negative for itching and rash.  Neurological: Negative for dizziness, extremity weakness, headaches and numbness.  Hematological: Negative for  adenopathy. Does not bruise/bleed easily.  Psychiatric/Behavioral: Negative for depression. The patient is not nervous/anxious.       PHYSICAL EXAMINATION  ECOG PERFORMANCE STATUS: 1 - Symptomatic but completely ambulatory  Vitals:   11/22/19 0933  BP: (!) 120/52  Pulse: 83  Resp: 18  Temp: 98.3 F (36.8 C)  SpO2: 96%    Physical Exam Constitutional:      General: She is not in acute distress.    Appearance: Normal appearance. She is not toxic-appearing.  HENT:     Head: Normocephalic and atraumatic.  Eyes:     General: No scleral icterus. Cardiovascular:     Rate and Rhythm: Normal rate and regular rhythm.     Heart sounds: Normal heart sounds.  Pulmonary:     Effort: Pulmonary effort is normal.     Breath sounds: Normal breath sounds.     Comments: Right breast s/p lumpectomy, no sign of local recurrence, left breast benign Abdominal:     General: Abdomen is flat. Bowel sounds are normal.  Musculoskeletal:  General: No swelling.     Cervical back: Neck supple.  Lymphadenopathy:     Cervical: No cervical adenopathy.  Skin:    General: Skin is warm and dry.     Capillary Refill: Capillary refill takes less than 2 seconds.     Findings: No rash.  Neurological:     General: No focal deficit present.     Mental Status: She is alert.  Psychiatric:        Mood and Affect: Mood normal.        Behavior: Behavior normal.     LABORATORY DATA:  CBC    Component Value Date/Time   WBC 5.0 11/22/2019 0905   RBC 3.95 11/22/2019 0905   HGB 12.1 11/22/2019 0905   HCT 36.0 11/22/2019 0905   PLT 263 11/22/2019 0905   MCV 91.1 11/22/2019 0905   MCH 30.6 11/22/2019 0905   MCHC 33.6 11/22/2019 0905   RDW 15.1 11/22/2019 0905   LYMPHSABS 1.4 11/22/2019 0905   MONOABS 0.3 11/22/2019 0905   EOSABS 0.1 11/22/2019 0905   BASOSABS 0.0 11/22/2019 0905    CMP     Component Value Date/Time   NA 140 11/22/2019 0905   K 4.3 11/22/2019 0905   CL 108 11/22/2019 0905    CO2 25 11/22/2019 0905   GLUCOSE 88 11/22/2019 0905   BUN 19 11/22/2019 0905   CREATININE 0.79 11/22/2019 0905   CALCIUM 9.3 11/22/2019 0905   PROT 6.2 (L) 11/22/2019 0905   ALBUMIN 3.8 11/22/2019 0905   AST 23 11/22/2019 0905   ALT 30 11/22/2019 0905   ALKPHOS 66 11/22/2019 0905   BILITOT 0.4 11/22/2019 0905   GFRNONAA >60 11/22/2019 0905   GFRAA >60 11/22/2019 0905        ASSESSMENT and PLAN:   Malignant neoplasm of 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, estrogen receptor positive ([facility]) 09/07/2019:Right lumpectomy ([name]): IDC with calcifications, grade 2, 1.5cm, clear margins, 2 lymph nodes negative.ER 100%, PR 70%, HER-2 positive, Ki-67 15% T1c N0 stage Ia  Treatment plan: 1.Adjuvant chemotherapy with Taxol [name] weekly x12 followed by [name] maintenance for 1 year 2.Follow-up adjuvant radiation 3.Follow-up adjuvant antiestrogen therapy ---------------------------------------------------------------------------------------------------------------------------------------------------- Current treatment:Cycle 8Taxol [name] Echocardiogram 08/24/2019: EF 60 to 65% Echocardiogram on 11/22/2019: (results pending)  Chemo toxicities: 1.Mild nausea: resolved 2.Mucositis: resolved, has magic mouthwash B12 level October 16, 2018: 395: Normal 3.  Hair loss 4.  Rash on the chest: Stable 5.  Leukopenia: Dose reduced, WBC normal today  Monitoring closely for chemo toxicities Return to clinicweekly for chemo and every other week for follow-up with Dr. Lindi Adie or oncology APP.    All questions were answered. The patient knows to call the clinic with any problems, questions or concerns. We can certainly see the patient much sooner if necessary.   Total encounter time: 20 minutes*  Wilber Bihari, NP 11/22/19 10:41 AM Medical Oncology and Hematology [facility] [place], [place] [address] Tel. [phone]     Fax. [phone]  *Total Encounter Time as defined by the Centers for Medicare and Medicaid Services includes, in addition to the face-to-face time of a patient visit (documented in the note above) non-face-to-face time: obtaining and reviewing outside history, ordering and reviewing medications, tests or procedures, care coordination (communications with other health care professionals or caregivers) and documentation in the medical record.

## 2019-11-22 NOTE — Progress Notes (Signed)
  Echocardiogram 2D Echocardiogram has been performed.  Amy Harrington 11/22/2019, 1:58 PM

## 2019-11-22 NOTE — Addendum Note (Signed)
Encounter addended by: Scarlette Calico, RN on: 11/22/2019 2:54 PM  Actions taken: Clinical Note Signed

## 2019-11-22 NOTE — Progress Notes (Signed)
CARDIO-ONCOLOGY CLINIC CONSULT NOTE  Referring Physician: Primary Care: Primary Cardiologist:  HPI: Amy Harrington is a 69 y/o woman with h/o GERD and R breast cancer referred by Dr. Lindi Adie for enrollment into the cardio-oncology clinic for cardiac surveillance while receiving chemotherapy.   SUMMARY OF ONCOLOGIC HISTORY:    Oncology History  Malignant neoplasm of 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, estrogen receptor positive ([place])  08/17/2019 Initial Diagnosis   Routine screening mammogram detected a 0.8cm indeterminate mass in the right breast. Biopsy showed invasive ductal carcinoma, grade 2, HER-2 + by FISH, ER+ 100%, PR+ 70%, Ki67 15%.    08/22/2019 Cancer Staging   Staging form: Breast, AJCC 8th Edition - Clinical stage from 08/22/2019: Stage IA (cT1b, cN0, cM0, G2, ER+, PR+, HER2+) - Signed by Nicholas Lose, MD on 08/22/2019   09/07/2019 Surgery   Right lumpectomy [facility]): IDC with calcifications, grade 2, 1.5cm, clear margins, 2 lymph nodes negative.    10/04/2019 -  Chemotherapy   The patient had PACLitaxel (TAXOL) 156 mg in sodium chloride 0.9 % 250 mL chemo infusion (</= 4m/m2), 80 mg/m2 = 156 mg, Intravenous,  Once, 2 of 3 cycles Dose modification: 70 mg/m2 (original dose 80 mg/m2, Cycle 2, Reason: Dose not tolerated) Administration: 156 mg (10/04/2019), 156 mg (10/10/2019), 138 mg (11/01/2019), 156 mg (10/17/2019), 138 mg (10/25/2019), 138 mg (11/09/2019), 138 mg (11/15/2019) trastuzumab-dkst (OGIVRI) 357 mg in sodium chloride 0.9 % 250 mL chemo infusion, 4 mg/kg = 357 mg, Intravenous,  Once, 2 of 16 cycles Administration: 357 mg (10/04/2019), 168 mg (10/10/2019), 168 mg (11/01/2019), 168 mg (10/17/2019), 168 mg (10/25/2019), 168 mg (11/09/2019), 168 mg (11/15/2019)  for chemotherapy treatment.      Denies any h/o of cardiac disease. 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 Then 4 weeks XRT. Had lumpectomy in 12/20. Remains active. No CP or SOB. No edema.   ECHO 08/24/19 EF 60-65% Grade I DD   Reviewed personally. Echo 11/22/19: EF 60-65%   Review of Systems: [y] = yes, [ ]  = no   General: Weight gain [ y]; Weight loss [ ] ; Anorexia [ ] ; Fatigue [ ] ; Fever [ ] ; Chills [ ] ; Weakness [ ]   Cardiac: Chest pain/pressure [ ] ; Resting SOB [ ] ; Exertional SOB [ ] ; Orthopnea [ ] ; Pedal Edema [ ] ; Palpitations [ ] ; Syncope [ ] ; Presyncope [ ] ; Paroxysmal nocturnal dyspnea[ ]   Pulmonary: Cough [ ] ; Wheezing[ ] ; Hemoptysis[ ] ; Sputum [ ] ; Snoring [ ]   GI: Vomiting[ ] ; Dysphagia[ ] ; Melena[ ] ; Hematochezia [ ] ; Heartburn[y ]; Abdominal pain [ ] ; Constipation [ ] ; Diarrhea [ ] ; BRBPR [ ]   GU: Hematuria[ ] ; Dysuria [ ] ; Nocturia[ ]   Vascular: Pain in legs with walking [ ] ; Pain in feet with lying flat [ ] ; Non-healing sores [ ] ; Stroke [ ] ; TIA [ ] ; Slurred speech [ ] ;  Neuro: Headaches[ ] ; Vertigo[ ] ; Seizures[ ] ; Paresthesias[ ] ;Blurred vision [ ] ; Diplopia [ ] ; Vision changes [ ]   Ortho/Skin: Arthritis [ ] ; Joint pain [ ] ; Muscle pain [ ] ; Joint swelling [ ] ; Back Pain [ ] ; Rash [ ]   Psych: Depression[ ] ; Anxiety[ ]   Heme: Bleeding problems [ ] ; Clotting disorders [ ] ; Anemia [ ]   Endocrine: Diabetes [ ] ; Thyroid dysfunction[ ]    Past Medical History:  Diagnosis Date  . GERD (gastroesophageal reflux disease)   . Plantar fasciitis   .  PONV (postoperative nausea and vomiting)     Current Outpatient Medications  Medication Sig Dispense Refill  . esomeprazole (NEXIUM) 20 MG capsule Take 20 mg by mouth daily at 12 noon.    . lidocaine-prilocaine (EMLA) cream Apply to affected area once 30 g 3  . LORazepam (ATIVAN) 0.5 MG tablet Take 1 tablet (0.5 mg total) by mouth at bedtime as needed for sleep. 30 tablet 0  . magic mouthwash w/lidocaine SOLN Take 5 mLs by mouth 3 (three) times daily as needed for mouth pain. 200 mL 3  . ondansetron (ZOFRAN) 8 MG tablet  Take 1 tablet (8 mg total) by mouth every 8 (eight) hours as needed for nausea or vomiting. 20 tablet 0  . prochlorperazine (COMPAZINE) 10 MG tablet Take 1 tablet (10 mg total) by mouth every 6 (six) hours as needed (Nausea or vomiting). 30 tablet 1   No current facility-administered medications for this encounter.    No Known Allergies    Social History   Socioeconomic History  . Marital status: Widowed    Spouse name: Not on file  . Number of children: Not on file  . Years of education: Not on file  . Highest education level: Not on file  Occupational History  . Not on file  Tobacco Use  . Smoking status: Never Smoker  . Smokeless tobacco: Never Used  Substance and Sexual Activity  . Alcohol use: Not Currently  . Drug use: Never  . Sexual activity: Not on file  Other Topics Concern  . Not on file  Social History Narrative  . Not on file   Social Determinants of Health   Financial Resource Strain:   . Difficulty of Paying Living Expenses: Not on file  Food Insecurity:   . Worried About Charity fundraiser in the Last Year: Not on file  . Ran Out of Food in the Last Year: Not on file  Transportation Needs:   . Lack of Transportation (Medical): Not on file  . Lack of Transportation (Non-Medical): Not on file  Physical Activity:   . Days of Exercise per Week: Not on file  . Minutes of Exercise per Session: Not on file  Stress:   . Feeling of Stress : Not on file  Social Connections:   . Frequency of Communication with Friends and Family: Not on file  . Frequency of Social Gatherings with Friends and Family: Not on file  . Attends Religious Services: Not on file  . Active Member of Clubs or Organizations: Not on file  . Attends Archivist Meetings: Not on file  . Marital Status: Not on file  Intimate Partner Violence:   . Fear of Current or Ex-Partner: Not on file  . Emotionally Abused: Not on file  . Physically Abused: Not on file  . Sexually Abused:  Not on file      Family History  Problem Relation Age of Onset  . Stomach cancer Father 53  GM died at 53 during childbirth due to "heart problems"   Vitals:   11/22/19 1357  BP: 130/80  Pulse: 90  SpO2: 95%  Weight: 88.1 kg (194 lb 3.2 oz)    PHYSICAL EXAM: General:  Well appearing. No respiratory difficulty HEENT: normal Neck: supple. no JVD. Carotids 2+ bilat; no bruits. No lymphadenopathy or thryomegaly appreciated. Cor: PMI nondisplaced. Regular rate & rhythm. No rubs, gallops or murmurs. Port on left ok  Lungs: clear Abdomen: obese  soft, nontender, nondistended. No hepatosplenomegaly. No  bruits or masses. Good bowel sounds. Extremities: no cyanosis, clubbing, rash, edema Neuro: alert & oriented x 3, cranial nerves grossly intact. moves all 4 extremities w/o difficulty. Affect pleasant.  ECG: NSR 87 No ST-T wave abnormalities. Personally reviewed  ASSESSMENT & PLAN: 1. Right Breast Cancer - being treated with Taxol/[name] started 1/21 - echo 3/4/ EF 60-65% grade I DD  Glori Bickers, MD  2:37 PM

## 2019-11-29 ENCOUNTER — Inpatient Hospital Stay: Payer: Medicare Other

## 2019-11-29 ENCOUNTER — Other Ambulatory Visit: Payer: Self-pay

## 2019-11-29 VITALS — BP 133/62 | HR 89 | Temp 98.3°F | Resp 18

## 2019-11-29 DIAGNOSIS — Z170 Estrogen receptor positive status [ER+]: Secondary | ICD-10-CM

## 2019-11-29 DIAGNOSIS — C50211 Malignant neoplasm of upper-inner quadrant of right female breast: Secondary | ICD-10-CM

## 2019-11-29 DIAGNOSIS — Z5112 Encounter for antineoplastic immunotherapy: Secondary | ICD-10-CM | POA: Diagnosis not present

## 2019-11-29 LAB — CMP (CANCER CENTER ONLY)
ALT: 28 U/L (ref 0–44)
AST: 23 U/L (ref 15–41)
Albumin: 3.6 g/dL (ref 3.5–5.0)
Alkaline Phosphatase: 64 U/L (ref 38–126)
Anion gap: 5 (ref 5–15)
BUN: 18 mg/dL (ref 8–23)
CO2: 27 mmol/L (ref 22–32)
Calcium: 9.1 mg/dL (ref 8.9–10.3)
Chloride: 109 mmol/L (ref 98–111)
Creatinine: 0.87 mg/dL (ref 0.44–1.00)
GFR, Est AFR Am: >60 mL/min (ref >60)
GFR, Estimated: >60 mL/min (ref >60)
Glucose, Bld: 94 mg/dL (ref 70–99)
Potassium: 4.4 mmol/L (ref 3.5–5.1)
Sodium: 141 mmol/L (ref 135–145)
Total Bilirubin: 0.4 mg/dL (ref 0.3–1.2)
Total Protein: 6.1 g/dL — ABNORMAL LOW (ref 6.5–8.1)

## 2019-11-29 LAB — CBC WITH DIFFERENTIAL (CANCER CENTER ONLY)
Abs Immature Granulocytes: 0.11 10*3/uL — ABNORMAL HIGH (ref 0.00–0.07)
Basophils Absolute: 0 10*3/uL (ref 0.0–0.1)
Basophils Relative: 1 %
Eosinophils Absolute: 0.1 10*3/uL (ref 0.0–0.5)
Eosinophils Relative: 1 %
HCT: 35.6 % — ABNORMAL LOW (ref 36.0–46.0)
Hemoglobin: 11.5 g/dL — ABNORMAL LOW (ref 12.0–15.0)
Immature Granulocytes: 2 %
Lymphocytes Relative: 38 %
Lymphs Abs: 1.7 10*3/uL (ref 0.7–4.0)
MCH: 30.3 pg (ref 26.0–34.0)
MCHC: 32.3 g/dL (ref 30.0–36.0)
MCV: 93.7 fL (ref 80.0–100.0)
Monocytes Absolute: 0.3 10*3/uL (ref 0.1–1.0)
Monocytes Relative: 7 %
Neutro Abs: 2.3 10*3/uL (ref 1.7–7.7)
Neutrophils Relative %: 51 %
Platelet Count: 271 10*3/uL (ref 150–400)
RBC: 3.8 MIL/uL — ABNORMAL LOW (ref 3.87–5.11)
RDW: 15.4 % (ref 11.5–15.5)
WBC Count: 4.6 10*3/uL (ref 4.0–10.5)
nRBC: 0 % (ref 0.0–0.2)

## 2019-11-29 MED ORDER — DEXAMETHASONE SODIUM PHOSPHATE 10 MG/ML IJ SOLN
INTRAMUSCULAR | Status: AC
  Filled 2019-11-29: qty 1

## 2019-11-29 MED ORDER — DIPHENHYDRAMINE HCL 50 MG/ML IJ SOLN
INTRAMUSCULAR | Status: AC
  Filled 2019-11-29: qty 1

## 2019-11-29 MED ORDER — ACETAMINOPHEN 325 MG PO TABS
650.0000 mg | ORAL_TABLET | Freq: Once | ORAL | Status: AC
  Administered 2019-11-29: 650 mg via ORAL

## 2019-11-29 MED ORDER — FAMOTIDINE IN NACL 20-0.9 MG/50ML-% IV SOLN
INTRAVENOUS | Status: AC
  Filled 2019-11-29: qty 50

## 2019-11-29 MED ORDER — HEPARIN SOD (PORK) LOCK FLUSH 100 UNIT/ML IV SOLN
500.0000 [IU] | Freq: Once | INTRAVENOUS | Status: AC | PRN
  Administered 2019-11-29: 500 [IU]
  Filled 2019-11-29: qty 5

## 2019-11-29 MED ORDER — TRASTUZUMAB-DKST CHEMO 150 MG IV SOLR
2.0000 mg/kg | Freq: Once | INTRAVENOUS | Status: AC
  Administered 2019-11-29: 168 mg via INTRAVENOUS
  Filled 2019-11-29: qty 8

## 2019-11-29 MED ORDER — DEXAMETHASONE SODIUM PHOSPHATE 10 MG/ML IJ SOLN
10.0000 mg | Freq: Once | INTRAMUSCULAR | Status: AC
  Administered 2019-11-29: 10 mg via INTRAVENOUS

## 2019-11-29 MED ORDER — DIPHENHYDRAMINE HCL 50 MG/ML IJ SOLN
25.0000 mg | Freq: Once | INTRAMUSCULAR | Status: AC
  Administered 2019-11-29: 25 mg via INTRAVENOUS

## 2019-11-29 MED ORDER — SODIUM CHLORIDE 0.9 % IV SOLN
70.0000 mg/m2 | Freq: Once | INTRAVENOUS | Status: AC
  Administered 2019-11-29: 138 mg via INTRAVENOUS
  Filled 2019-11-29: qty 23

## 2019-11-29 MED ORDER — SODIUM CHLORIDE 0.9% FLUSH
10.0000 mL | INTRAVENOUS | Status: DC | PRN
  Administered 2019-11-29: 10 mL
  Filled 2019-11-29: qty 10

## 2019-11-29 MED ORDER — FAMOTIDINE IN NACL 20-0.9 MG/50ML-% IV SOLN
20.0000 mg | Freq: Once | INTRAVENOUS | Status: AC
  Administered 2019-11-29: 20 mg via INTRAVENOUS

## 2019-11-29 MED ORDER — ACETAMINOPHEN 325 MG PO TABS
ORAL_TABLET | ORAL | Status: AC
  Filled 2019-11-29: qty 2

## 2019-11-29 MED ORDER — SODIUM CHLORIDE 0.9 % IV SOLN
Freq: Once | INTRAVENOUS | Status: AC
  Filled 2019-11-29: qty 250

## 2019-11-29 NOTE — Patient Instructions (Signed)
[facility] Discharge Instructions for Patients Receiving Chemotherapy  Today you received the following agents: Trastuzumab & Paclitaxel  To help prevent nausea and vomiting after your treatment, we encourage you to take your nausea medication as directed by your MD.   If you develop nausea and vomiting that is not controlled by your nausea medication, call the clinic.   BELOW ARE SYMPTOMS THAT SHOULD BE REPORTED IMMEDIATELY:  *FEVER GREATER THAN 100.5 F  *CHILLS WITH OR WITHOUT FEVER  NAUSEA AND VOMITING THAT IS NOT CONTROLLED WITH YOUR NAUSEA MEDICATION  *UNUSUAL SHORTNESS OF BREATH  *UNUSUAL BRUISING OR BLEEDING  TENDERNESS IN MOUTH AND THROAT WITH OR WITHOUT PRESENCE OF ULCERS  *URINARY PROBLEMS  *BOWEL PROBLEMS  UNUSUAL RASH Items with * indicate a potential emergency and should be followed up as soon as possible.  Feel free to call the clinic should you have any questions or concerns. The clinic phone number is [phone].  Please show the [place] at check-in to the Emergency Department and triage nurse.

## 2019-12-05 NOTE — Progress Notes (Signed)
Patient Care Team: Carol Ada, MD as PCP - General (Family Medicine) Rockwell Germany, RN as Oncology Nurse Navigator Tressie Ellis, Paulette Blanch, RN as Oncology Nurse Navigator Nicholas Lose, MD as Consulting Physician (Hematology and Oncology) Kyung Rudd, MD as Consulting Physician (Radiation Oncology) Stark Klein, MD as Consulting Physician (General Surgery) Kyung Rudd, MD as Consulting Physician (Radiation Oncology)  DIAGNOSIS:    ICD-10-CM   1. Malignant neoplasm of 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, estrogen receptor positive ([place])  C50.211    Z17.0     SUMMARY OF ONCOLOGIC HISTORY: Oncology History  Malignant neoplasm of 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, estrogen receptor positive ([place])  08/17/2019 Initial Diagnosis   Routine screening mammogram detected a 0.8cm indeterminate mass in the right breast. Biopsy showed invasive ductal carcinoma, grade 2, HER-2 + by FISH, ER+ 100%, PR+ 70%, Ki67 15%.    08/22/2019 Cancer Staging   Staging form: Breast, AJCC 8th Edition - Clinical stage from 08/22/2019: Stage IA (cT1b, cN0, cM0, G2, ER+, PR+, HER2+) - Signed by Nicholas Lose, MD on 08/22/2019   09/07/2019 Surgery   Right lumpectomy [facility]): IDC with calcifications, grade 2, 1.5cm, clear margins, 2 lymph nodes negative.    10/04/2019 -  Chemotherapy   The patient had PACLitaxel (TAXOL) 156 mg in sodium chloride 0.9 % 250 mL chemo infusion (</= 74m/m2), 80 mg/m2 = 156 mg, Intravenous,  Once, 3 of 3 cycles Dose modification: 70 mg/m2 (original dose 80 mg/m2, Cycle 2, Reason: Dose not tolerated) Administration: 156 mg (10/04/2019), 156 mg (10/10/2019), 138 mg (11/01/2019), 156 mg (10/17/2019), 138 mg (10/25/2019), 138 mg (11/09/2019), 138 mg (11/15/2019), 138 mg (11/22/2019), 138 mg (11/29/2019) trastuzumab-dkst (OGIVRI) 357 mg in sodium chloride 0.9 % 250 mL chemo infusion, 4 mg/kg = 357 mg, Intravenous,  Once, 3 of 16 cycles Administration: 357 mg (10/04/2019), 168 mg  (10/10/2019), 168 mg (11/01/2019), 168 mg (10/17/2019), 168 mg (10/25/2019), 168 mg (11/09/2019), 168 mg (11/15/2019), 168 mg (11/22/2019), 168 mg (11/29/2019)  for chemotherapy treatment.      CHIEF COMPLIANT: Cycle10Taxol [name]  INTERVAL HISTORY: Amy LEMARis a 69y.o. with above-mentioned history of right breast cancerwhounderwent a right lumpectomyand is currently on adjuvant chemotherapy with Taxol [name].Echo on 11/22/19 showed an ejection fraction of 60-65%. She presents to the clinic todayfor cycle10and a toxicity check.  She reports that she has occasional neuropathy at the tips of the fingers which is intermittent.  Denies any nausea or vomiting.  She has more fatigue than before.  ALLERGIES:  has No Known Allergies.  MEDICATIONS:  Current Outpatient Medications  Medication Sig Dispense Refill  . esomeprazole (NEXIUM) 20 MG capsule Take 20 mg by mouth daily at 12 noon.    . lidocaine-prilocaine (EMLA) cream Apply to affected area once 30 g 3  . LORazepam (ATIVAN) 0.5 MG tablet Take 1 tablet (0.5 mg total) by mouth at bedtime as needed for sleep. 30 tablet 0  . magic mouthwash w/lidocaine SOLN Take 5 mLs by mouth 3 (three) times daily as needed for mouth pain. 200 mL 3  . ondansetron (ZOFRAN) 8 MG tablet Take 1 tablet (8 mg total) by mouth every 8 (eight) hours as needed for nausea or vomiting. 20 tablet 0  . prochlorperazine (COMPAZINE) 10 MG tablet Take 1 tablet (10 mg total) by mouth every 6 (six) hours as needed (Nausea or vomiting). 30 tablet 1   No current facility-administered medications for this visit.   Facility-Administered Medications Ordered in Other Visits  Medication Dose Route Frequency Provider Last Rate Last Admin  . sodium chloride flush (NS) 0.9 % injection 10 mL  10 mL Intracatheter PRN Nicholas Lose, MD   10 mL at 12/06/19 0843    PHYSICAL EXAMINATION: ECOG PERFORMANCE STATUS: 1 - Symptomatic but completely ambulatory  There were no vitals filed  for this visit. There were no vitals filed for this visit.  LABORATORY DATA:  I have reviewed the data as listed CMP Latest Ref Rng & Units 11/29/2019 11/22/2019 11/15/2019  Glucose 70 - 99 mg/dL 94 88 81  BUN 8 - 23 mg/dL 18 19 13   Creatinine 0.44 - 1.00 mg/dL 0.87 0.79 0.73  Sodium 135 - 145 mmol/L 141 140 142  Potassium 3.5 - 5.1 mmol/L 4.4 4.3 3.8  Chloride 98 - 111 mmol/L 109 108 108  CO2 22 - 32 mmol/L 27 25 27   Calcium 8.9 - 10.3 mg/dL 9.1 9.3 8.7(L)  Total Protein 6.5 - 8.1 g/dL 6.1(L) 6.2(L) 6.2(L)  Total Bilirubin 0.3 - 1.2 mg/dL 0.4 0.4 0.5  Alkaline Phos 38 - 126 U/L 64 66 65  AST 15 - 41 U/L 23 23 23   ALT 0 - 44 U/L 28 30 33    Lab Results  Component Value Date   WBC 4.6 11/29/2019   HGB 11.5 (L) 11/29/2019   HCT 35.6 (L) 11/29/2019   MCV 93.7 11/29/2019   PLT 271 11/29/2019   NEUTROABS 2.3 11/29/2019    ASSESSMENT & PLAN:  Malignant neoplasm of 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, estrogen receptor positive ([place]) 09/07/2019:Right lumpectomy ([name]): IDC with calcifications, grade 2, 1.5cm, clear margins, 2 lymph nodes negative.ER 100%, PR 70%, HER-2 positive, Ki-67 15% T1c N0 stage Ia  Treatment plan: 1.Adjuvant chemotherapy with Taxol [name] weekly x12 followed by [name] maintenance for 1 year 2.Follow-up adjuvant radiation 3.Follow-up adjuvant antiestrogen therapy ---------------------------------------------------------------------------------------------------------------------------------------------------- Current treatment:Cycle10Taxol [name] Echocardiogram 08/24/2019: EF 60 to 65% Echocardiogram on 11/22/2019: (results pending)  Chemo toxicities: 1.Mild nausea:resolved 2.Mucositis: resolved, has magic mouthwash B12 level October 16, 2018: 395: Normal 3.Hair loss 4.Rash on the chest: No further issues 5.Leukopenia: Dose reduced, WBC normal today 6.  Fatigue: Mild to moderate  Monitoring closely for chemo  toxicities Return to clinicweekly for chemo and every other week for follow-up with me.    No orders of the defined types were placed in this encounter.  The patient has a good understanding of the overall plan. she agrees with it. she will call with any problems that may develop before the next visit here.  Total time spent: 30 mins including face to face time and time spent for planning, charting and coordination of care  Nicholas Lose, MD 12/06/2019  I, Cloyde Reams Dorshimer, am acting as scribe for Dr. Nicholas Lose.  I have reviewed the above documentation for accuracy and completeness, and I agree with the above.

## 2019-12-06 ENCOUNTER — Inpatient Hospital Stay: Payer: Medicare Other

## 2019-12-06 ENCOUNTER — Encounter: Payer: Self-pay | Admitting: *Deleted

## 2019-12-06 ENCOUNTER — Inpatient Hospital Stay (HOSPITAL_BASED_OUTPATIENT_CLINIC_OR_DEPARTMENT_OTHER): Payer: Medicare Other | Admitting: Hematology and Oncology

## 2019-12-06 ENCOUNTER — Other Ambulatory Visit: Payer: Self-pay

## 2019-12-06 DIAGNOSIS — Z5112 Encounter for antineoplastic immunotherapy: Secondary | ICD-10-CM | POA: Diagnosis not present

## 2019-12-06 DIAGNOSIS — C50211 Malignant neoplasm of upper-inner quadrant of right female breast: Secondary | ICD-10-CM | POA: Diagnosis not present

## 2019-12-06 DIAGNOSIS — Z95828 Presence of other vascular implants and grafts: Secondary | ICD-10-CM

## 2019-12-06 DIAGNOSIS — Z170 Estrogen receptor positive status [ER+]: Secondary | ICD-10-CM

## 2019-12-06 LAB — CBC WITH DIFFERENTIAL (CANCER CENTER ONLY)
Abs Immature Granulocytes: 0.15 10*3/uL — ABNORMAL HIGH (ref 0.00–0.07)
Basophils Absolute: 0.1 10*3/uL (ref 0.0–0.1)
Basophils Relative: 1 %
Eosinophils Absolute: 0.1 10*3/uL (ref 0.0–0.5)
Eosinophils Relative: 2 %
HCT: 34.1 % — ABNORMAL LOW (ref 36.0–46.0)
Hemoglobin: 11.2 g/dL — ABNORMAL LOW (ref 12.0–15.0)
Immature Granulocytes: 4 %
Lymphocytes Relative: 28 %
Lymphs Abs: 1.2 10*3/uL (ref 0.7–4.0)
MCH: 30.5 pg (ref 26.0–34.0)
MCHC: 32.8 g/dL (ref 30.0–36.0)
MCV: 92.9 fL (ref 80.0–100.0)
Monocytes Absolute: 0.3 10*3/uL (ref 0.1–1.0)
Monocytes Relative: 6 %
Neutro Abs: 2.6 10*3/uL (ref 1.7–7.7)
Neutrophils Relative %: 59 %
Platelet Count: 223 10*3/uL (ref 150–400)
RBC: 3.67 MIL/uL — ABNORMAL LOW (ref 3.87–5.11)
RDW: 15.6 % — ABNORMAL HIGH (ref 11.5–15.5)
WBC Count: 4.3 10*3/uL (ref 4.0–10.5)
nRBC: 0 % (ref 0.0–0.2)

## 2019-12-06 LAB — CMP (CANCER CENTER ONLY)
ALT: 28 U/L (ref 0–44)
AST: 25 U/L (ref 15–41)
Albumin: 3.6 g/dL (ref 3.5–5.0)
Alkaline Phosphatase: 63 U/L (ref 38–126)
Anion gap: 7 (ref 5–15)
BUN: 12 mg/dL (ref 8–23)
CO2: 24 mmol/L (ref 22–32)
Calcium: 8.8 mg/dL — ABNORMAL LOW (ref 8.9–10.3)
Chloride: 108 mmol/L (ref 98–111)
Creatinine: 0.77 mg/dL (ref 0.44–1.00)
GFR, Est AFR Am: >60 mL/min (ref >60)
GFR, Estimated: >60 mL/min (ref >60)
Glucose, Bld: 95 mg/dL (ref 70–99)
Potassium: 4.3 mmol/L (ref 3.5–5.1)
Sodium: 139 mmol/L (ref 135–145)
Total Bilirubin: 0.7 mg/dL (ref 0.3–1.2)
Total Protein: 6 g/dL — ABNORMAL LOW (ref 6.5–8.1)

## 2019-12-06 MED ORDER — ACETAMINOPHEN 325 MG PO TABS
ORAL_TABLET | ORAL | Status: AC
  Filled 2019-12-06: qty 2

## 2019-12-06 MED ORDER — DIPHENHYDRAMINE HCL 50 MG/ML IJ SOLN
25.0000 mg | Freq: Once | INTRAMUSCULAR | Status: AC
  Administered 2019-12-06: 25 mg via INTRAVENOUS

## 2019-12-06 MED ORDER — FAMOTIDINE IN NACL 20-0.9 MG/50ML-% IV SOLN
20.0000 mg | Freq: Once | INTRAVENOUS | Status: AC
  Administered 2019-12-06: 10:00:00 20 mg via INTRAVENOUS

## 2019-12-06 MED ORDER — DEXAMETHASONE SODIUM PHOSPHATE 10 MG/ML IJ SOLN
10.0000 mg | Freq: Once | INTRAMUSCULAR | Status: AC
  Administered 2019-12-06: 10:00:00 10 mg via INTRAVENOUS

## 2019-12-06 MED ORDER — DEXAMETHASONE SODIUM PHOSPHATE 10 MG/ML IJ SOLN
INTRAMUSCULAR | Status: AC
  Filled 2019-12-06: qty 1

## 2019-12-06 MED ORDER — SODIUM CHLORIDE 0.9% FLUSH
10.0000 mL | INTRAVENOUS | Status: DC | PRN
  Administered 2019-12-06: 10 mL
  Filled 2019-12-06: qty 10

## 2019-12-06 MED ORDER — SODIUM CHLORIDE 0.9 % IV SOLN
Freq: Once | INTRAVENOUS | Status: AC
  Filled 2019-12-06: qty 250

## 2019-12-06 MED ORDER — ACETAMINOPHEN 325 MG PO TABS
650.0000 mg | ORAL_TABLET | Freq: Once | ORAL | Status: AC
  Administered 2019-12-06: 10:00:00 650 mg via ORAL

## 2019-12-06 MED ORDER — DIPHENHYDRAMINE HCL 50 MG/ML IJ SOLN
INTRAMUSCULAR | Status: AC
  Filled 2019-12-06: qty 1

## 2019-12-06 MED ORDER — FAMOTIDINE IN NACL 20-0.9 MG/50ML-% IV SOLN
INTRAVENOUS | Status: AC
  Filled 2019-12-06: qty 50

## 2019-12-06 MED ORDER — SODIUM CHLORIDE 0.9% FLUSH
10.0000 mL | INTRAVENOUS | Status: DC | PRN
  Administered 2019-12-06: 13:00:00 10 mL
  Filled 2019-12-06: qty 10

## 2019-12-06 MED ORDER — HEPARIN SOD (PORK) LOCK FLUSH 100 UNIT/ML IV SOLN
500.0000 [IU] | Freq: Once | INTRAVENOUS | Status: AC | PRN
  Administered 2019-12-06: 13:00:00 500 [IU]
  Filled 2019-12-06: qty 5

## 2019-12-06 MED ORDER — TRASTUZUMAB-DKST CHEMO 150 MG IV SOLR
2.0000 mg/kg | Freq: Once | INTRAVENOUS | Status: AC
  Administered 2019-12-06: 168 mg via INTRAVENOUS
  Filled 2019-12-06: qty 8

## 2019-12-06 MED ORDER — SODIUM CHLORIDE 0.9 % IV SOLN
70.0000 mg/m2 | Freq: Once | INTRAVENOUS | Status: AC
  Administered 2019-12-06: 138 mg via INTRAVENOUS
  Filled 2019-12-06: qty 23

## 2019-12-06 NOTE — Assessment & Plan Note (Signed)
09/07/2019:Right lumpectomy [facility]): IDC with calcifications, grade 2, 1.5cm, clear margins, 2 lymph nodes negative.ER 100%, PR 70%, HER-2 positive, Ki-67 15% T1c N0 stage Ia  Treatment plan: 1.Adjuvant chemotherapy with Taxol [name] weekly x12 followed by [name] maintenance for 1 year 2.Follow-up adjuvant radiation 3.Follow-up adjuvant antiestrogen therapy ---------------------------------------------------------------------------------------------------------------------------------------------------- Current treatment:Cycle10Taxol [name] Echocardiogram 08/24/2019: EF 60 to 65% Echocardiogram on 11/22/2019: (results pending)  Chemo toxicities: 1.Mild nausea:resolved 2.Mucositis: resolved, has magic mouthwash B12 level October 16, 2018: 395: Normal 3.Hair loss 4.Rash on the chest: Stable 5.Leukopenia: Dose reduced, WBC normal today  Monitoring closely for chemo toxicities Return to clinicweekly for chemo and every other week for follow-up with me.

## 2019-12-07 NOTE — Progress Notes (Signed)
Pharmacist Chemotherapy Monitoring - Follow Up Assessment    I verify that I have reviewed each item in the below checklist:  . Regimen for the patient is scheduled for the appropriate day and plan matches scheduled date. Marland Kitchen Appropriate non-routine labs are ordered dependent on drug ordered. . If applicable, additional medications reviewed and ordered per protocol based on lifetime cumulative doses and/or treatment regimen.   Plan for follow-up and/or issues identified: No . I-vent associated with next due treatment: No . MD and/or nursing notified: No  Philomena Course 12/07/2019 8:36 AM

## 2019-12-12 ENCOUNTER — Encounter: Payer: Self-pay | Admitting: *Deleted

## 2019-12-13 ENCOUNTER — Inpatient Hospital Stay: Payer: Medicare Other

## 2019-12-13 ENCOUNTER — Other Ambulatory Visit: Payer: Self-pay

## 2019-12-13 VITALS — BP 135/71 | HR 84 | Temp 97.8°F | Resp 18 | Ht 63.0 in | Wt 194.2 lb

## 2019-12-13 DIAGNOSIS — Z95828 Presence of other vascular implants and grafts: Secondary | ICD-10-CM

## 2019-12-13 DIAGNOSIS — C50211 Malignant neoplasm of upper-inner quadrant of right female breast: Secondary | ICD-10-CM

## 2019-12-13 DIAGNOSIS — Z5112 Encounter for antineoplastic immunotherapy: Secondary | ICD-10-CM | POA: Diagnosis not present

## 2019-12-13 LAB — CBC WITH DIFFERENTIAL (CANCER CENTER ONLY)
Abs Immature Granulocytes: 0.18 10*3/uL — ABNORMAL HIGH (ref 0.00–0.07)
Basophils Absolute: 0.1 10*3/uL (ref 0.0–0.1)
Basophils Relative: 1 %
Eosinophils Absolute: 0.1 10*3/uL (ref 0.0–0.5)
Eosinophils Relative: 1 %
HCT: 33.8 % — ABNORMAL LOW (ref 36.0–46.0)
Hemoglobin: 11.1 g/dL — ABNORMAL LOW (ref 12.0–15.0)
Immature Granulocytes: 4 %
Lymphocytes Relative: 34 %
Lymphs Abs: 1.6 10*3/uL (ref 0.7–4.0)
MCH: 30.1 pg (ref 26.0–34.0)
MCHC: 32.8 g/dL (ref 30.0–36.0)
MCV: 91.6 fL (ref 80.0–100.0)
Monocytes Absolute: 0.3 10*3/uL (ref 0.1–1.0)
Monocytes Relative: 6 %
Neutro Abs: 2.6 10*3/uL (ref 1.7–7.7)
Neutrophils Relative %: 54 %
Platelet Count: 240 10*3/uL (ref 150–400)
RBC: 3.69 MIL/uL — ABNORMAL LOW (ref 3.87–5.11)
RDW: 15.5 % (ref 11.5–15.5)
WBC Count: 4.8 10*3/uL (ref 4.0–10.5)
nRBC: 0 % (ref 0.0–0.2)

## 2019-12-13 LAB — CMP (CANCER CENTER ONLY)
ALT: 26 U/L (ref 0–44)
AST: 21 U/L (ref 15–41)
Albumin: 3.6 g/dL (ref 3.5–5.0)
Alkaline Phosphatase: 68 U/L (ref 38–126)
Anion gap: 8 (ref 5–15)
BUN: 16 mg/dL (ref 8–23)
CO2: 25 mmol/L (ref 22–32)
Calcium: 9 mg/dL (ref 8.9–10.3)
Chloride: 107 mmol/L (ref 98–111)
Creatinine: 0.78 mg/dL (ref 0.44–1.00)
GFR, Est AFR Am: >60 mL/min (ref >60)
GFR, Estimated: >60 mL/min (ref >60)
Glucose, Bld: 110 mg/dL — ABNORMAL HIGH (ref 70–99)
Potassium: 3.8 mmol/L (ref 3.5–5.1)
Sodium: 140 mmol/L (ref 135–145)
Total Bilirubin: 0.5 mg/dL (ref 0.3–1.2)
Total Protein: 6.1 g/dL — ABNORMAL LOW (ref 6.5–8.1)

## 2019-12-13 MED ORDER — DIPHENHYDRAMINE HCL 50 MG/ML IJ SOLN
25.0000 mg | Freq: Once | INTRAMUSCULAR | Status: AC
  Administered 2019-12-13: 25 mg via INTRAVENOUS

## 2019-12-13 MED ORDER — SODIUM CHLORIDE 0.9 % IV SOLN
Freq: Once | INTRAVENOUS | Status: AC
  Filled 2019-12-13: qty 250

## 2019-12-13 MED ORDER — DIPHENHYDRAMINE HCL 50 MG/ML IJ SOLN
INTRAMUSCULAR | Status: AC
  Filled 2019-12-13: qty 1

## 2019-12-13 MED ORDER — HEPARIN SOD (PORK) LOCK FLUSH 100 UNIT/ML IV SOLN
500.0000 [IU] | Freq: Once | INTRAVENOUS | Status: AC | PRN
  Administered 2019-12-13: 500 [IU]
  Filled 2019-12-13: qty 5

## 2019-12-13 MED ORDER — ACETAMINOPHEN 325 MG PO TABS
650.0000 mg | ORAL_TABLET | Freq: Once | ORAL | Status: AC
  Administered 2019-12-13: 650 mg via ORAL

## 2019-12-13 MED ORDER — DEXAMETHASONE SODIUM PHOSPHATE 10 MG/ML IJ SOLN
INTRAMUSCULAR | Status: AC
  Filled 2019-12-13: qty 1

## 2019-12-13 MED ORDER — SODIUM CHLORIDE 0.9% FLUSH
10.0000 mL | INTRAVENOUS | Status: DC | PRN
  Administered 2019-12-13: 10 mL
  Filled 2019-12-13: qty 10

## 2019-12-13 MED ORDER — FAMOTIDINE IN NACL 20-0.9 MG/50ML-% IV SOLN
20.0000 mg | Freq: Once | INTRAVENOUS | Status: AC
  Administered 2019-12-13: 20 mg via INTRAVENOUS

## 2019-12-13 MED ORDER — TRASTUZUMAB-DKST CHEMO 150 MG IV SOLR
2.0000 mg/kg | Freq: Once | INTRAVENOUS | Status: AC
  Administered 2019-12-13: 168 mg via INTRAVENOUS
  Filled 2019-12-13: qty 8

## 2019-12-13 MED ORDER — FAMOTIDINE IN NACL 20-0.9 MG/50ML-% IV SOLN
INTRAVENOUS | Status: AC
  Filled 2019-12-13: qty 50

## 2019-12-13 MED ORDER — ACETAMINOPHEN 325 MG PO TABS
ORAL_TABLET | ORAL | Status: AC
  Filled 2019-12-13: qty 2

## 2019-12-13 MED ORDER — HEPARIN SOD (PORK) LOCK FLUSH 100 UNIT/ML IV SOLN
500.0000 [IU] | Freq: Once | INTRAVENOUS | Status: DC | PRN
  Filled 2019-12-13: qty 5

## 2019-12-13 MED ORDER — SODIUM CHLORIDE 0.9 % IV SOLN
70.0000 mg/m2 | Freq: Once | INTRAVENOUS | Status: AC
  Administered 2019-12-13: 138 mg via INTRAVENOUS
  Filled 2019-12-13: qty 23

## 2019-12-13 MED ORDER — DEXAMETHASONE SODIUM PHOSPHATE 10 MG/ML IJ SOLN
10.0000 mg | Freq: Once | INTRAMUSCULAR | Status: AC
  Administered 2019-12-13: 10 mg via INTRAVENOUS

## 2019-12-13 NOTE — Patient Instructions (Signed)
[facility] Discharge Instructions for Patients Receiving Chemotherapy  Today you received the following chemotherapy agents [name], Taxol  To help prevent nausea and vomiting after your treatment, we encourage you to take your nausea medication as directed  If you develop nausea and vomiting that is not controlled by your nausea medication, call the clinic.   BELOW ARE SYMPTOMS THAT SHOULD BE REPORTED IMMEDIATELY:  *FEVER GREATER THAN 100.5 F  *CHILLS WITH OR WITHOUT FEVER  NAUSEA AND VOMITING THAT IS NOT CONTROLLED WITH YOUR NAUSEA MEDICATION  *UNUSUAL SHORTNESS OF BREATH  *UNUSUAL BRUISING OR BLEEDING  TENDERNESS IN MOUTH AND THROAT WITH OR WITHOUT PRESENCE OF ULCERS  *URINARY PROBLEMS  *BOWEL PROBLEMS  UNUSUAL RASH Items with * indicate a potential emergency and should be followed up as soon as possible.  Feel free to call the clinic should you have any questions or concerns. The clinic phone number is [phone].  Please show the CHEMO ALERT CARD at check-in to the Emergency Department and triage nurse.   

## 2019-12-14 NOTE — Progress Notes (Signed)
Pharmacist Chemotherapy Monitoring - Follow Up Assessment    I verify that I have reviewed each item in the below checklist:  . Regimen for the patient is scheduled for the appropriate day and plan matches scheduled date. Marland Kitchen Appropriate non-routine labs are ordered dependent on drug ordered. . If applicable, additional medications reviewed and ordered per protocol based on lifetime cumulative doses and/or treatment regimen.   Plan for follow-up and/or issues identified: No . I-vent associated with next due treatment: No . MD and/or nursing notified: No  Philomena Course 12/14/2019 9:05 AM

## 2019-12-20 ENCOUNTER — Inpatient Hospital Stay: Payer: Medicare Other

## 2019-12-20 ENCOUNTER — Encounter: Payer: Self-pay | Admitting: Adult Health

## 2019-12-20 ENCOUNTER — Encounter: Payer: Self-pay | Admitting: *Deleted

## 2019-12-20 ENCOUNTER — Inpatient Hospital Stay (HOSPITAL_BASED_OUTPATIENT_CLINIC_OR_DEPARTMENT_OTHER): Payer: Medicare Other | Admitting: Adult Health

## 2019-12-20 ENCOUNTER — Inpatient Hospital Stay: Payer: Medicare Other | Attending: Hematology and Oncology

## 2019-12-20 ENCOUNTER — Other Ambulatory Visit: Payer: Self-pay

## 2019-12-20 DIAGNOSIS — C50211 Malignant neoplasm of upper-inner quadrant of right female breast: Secondary | ICD-10-CM | POA: Diagnosis present

## 2019-12-20 DIAGNOSIS — Z170 Estrogen receptor positive status [ER+]: Secondary | ICD-10-CM

## 2019-12-20 DIAGNOSIS — Z95828 Presence of other vascular implants and grafts: Secondary | ICD-10-CM

## 2019-12-20 DIAGNOSIS — Z5111 Encounter for antineoplastic chemotherapy: Secondary | ICD-10-CM | POA: Insufficient documentation

## 2019-12-20 DIAGNOSIS — Z5112 Encounter for antineoplastic immunotherapy: Secondary | ICD-10-CM | POA: Diagnosis present

## 2019-12-20 LAB — CBC WITH DIFFERENTIAL (CANCER CENTER ONLY)
Abs Immature Granulocytes: 0.15 10*3/uL — ABNORMAL HIGH (ref 0.00–0.07)
Basophils Absolute: 0 10*3/uL (ref 0.0–0.1)
Basophils Relative: 1 %
Eosinophils Absolute: 0.1 10*3/uL (ref 0.0–0.5)
Eosinophils Relative: 2 %
HCT: 34.1 % — ABNORMAL LOW (ref 36.0–46.0)
Hemoglobin: 11 g/dL — ABNORMAL LOW (ref 12.0–15.0)
Immature Granulocytes: 3 %
Lymphocytes Relative: 33 %
Lymphs Abs: 1.5 10*3/uL (ref 0.7–4.0)
MCH: 30.5 pg (ref 26.0–34.0)
MCHC: 32.3 g/dL (ref 30.0–36.0)
MCV: 94.5 fL (ref 80.0–100.0)
Monocytes Absolute: 0.3 10*3/uL (ref 0.1–1.0)
Monocytes Relative: 7 %
Neutro Abs: 2.4 10*3/uL (ref 1.7–7.7)
Neutrophils Relative %: 54 %
Platelet Count: 255 10*3/uL (ref 150–400)
RBC: 3.61 MIL/uL — ABNORMAL LOW (ref 3.87–5.11)
RDW: 15.9 % — ABNORMAL HIGH (ref 11.5–15.5)
WBC Count: 4.5 10*3/uL (ref 4.0–10.5)
nRBC: 0 % (ref 0.0–0.2)

## 2019-12-20 LAB — CMP (CANCER CENTER ONLY)
ALT: 24 U/L (ref 0–44)
AST: 23 U/L (ref 15–41)
Albumin: 3.8 g/dL (ref 3.5–5.0)
Alkaline Phosphatase: 66 U/L (ref 38–126)
Anion gap: 9 (ref 5–15)
BUN: 15 mg/dL (ref 8–23)
CO2: 23 mmol/L (ref 22–32)
Calcium: 9.2 mg/dL (ref 8.9–10.3)
Chloride: 109 mmol/L (ref 98–111)
Creatinine: 0.76 mg/dL (ref 0.44–1.00)
GFR, Est AFR Am: >60 mL/min (ref >60)
GFR, Estimated: >60 mL/min (ref >60)
Glucose, Bld: 81 mg/dL (ref 70–99)
Potassium: 4.2 mmol/L (ref 3.5–5.1)
Sodium: 141 mmol/L (ref 135–145)
Total Bilirubin: 0.5 mg/dL (ref 0.3–1.2)
Total Protein: 6.3 g/dL — ABNORMAL LOW (ref 6.5–8.1)

## 2019-12-20 MED ORDER — TRASTUZUMAB-DKST CHEMO 150 MG IV SOLR
2.0000 mg/kg | Freq: Once | INTRAVENOUS | Status: AC
  Administered 2019-12-20: 168 mg via INTRAVENOUS
  Filled 2019-12-20: qty 8

## 2019-12-20 MED ORDER — FAMOTIDINE IN NACL 20-0.9 MG/50ML-% IV SOLN
20.0000 mg | Freq: Once | INTRAVENOUS | Status: AC
  Administered 2019-12-20: 20 mg via INTRAVENOUS

## 2019-12-20 MED ORDER — SODIUM CHLORIDE 0.9% FLUSH
10.0000 mL | INTRAVENOUS | Status: DC | PRN
  Administered 2019-12-20: 12:00:00 10 mL
  Filled 2019-12-20: qty 10

## 2019-12-20 MED ORDER — SODIUM CHLORIDE 0.9 % IV SOLN
Freq: Once | INTRAVENOUS | Status: AC
  Filled 2019-12-20: qty 250

## 2019-12-20 MED ORDER — DIPHENHYDRAMINE HCL 50 MG/ML IJ SOLN
INTRAMUSCULAR | Status: AC
  Filled 2019-12-20: qty 1

## 2019-12-20 MED ORDER — ACETAMINOPHEN 325 MG PO TABS
650.0000 mg | ORAL_TABLET | Freq: Once | ORAL | Status: AC
  Administered 2019-12-20: 650 mg via ORAL

## 2019-12-20 MED ORDER — SODIUM CHLORIDE 0.9% FLUSH
10.0000 mL | INTRAVENOUS | Status: DC | PRN
  Administered 2019-12-20: 10 mL
  Filled 2019-12-20: qty 10

## 2019-12-20 MED ORDER — FAMOTIDINE IN NACL 20-0.9 MG/50ML-% IV SOLN
INTRAVENOUS | Status: AC
  Filled 2019-12-20: qty 50

## 2019-12-20 MED ORDER — DIPHENHYDRAMINE HCL 50 MG/ML IJ SOLN
25.0000 mg | Freq: Once | INTRAMUSCULAR | Status: AC
  Administered 2019-12-20: 25 mg via INTRAVENOUS

## 2019-12-20 MED ORDER — DEXAMETHASONE SODIUM PHOSPHATE 10 MG/ML IJ SOLN
10.0000 mg | Freq: Once | INTRAMUSCULAR | Status: AC
  Administered 2019-12-20: 10 mg via INTRAVENOUS

## 2019-12-20 MED ORDER — DEXAMETHASONE SODIUM PHOSPHATE 10 MG/ML IJ SOLN
INTRAMUSCULAR | Status: AC
  Filled 2019-12-20: qty 1

## 2019-12-20 MED ORDER — ACETAMINOPHEN 325 MG PO TABS
ORAL_TABLET | ORAL | Status: AC
  Filled 2019-12-20: qty 2

## 2019-12-20 MED ORDER — SODIUM CHLORIDE 0.9 % IV SOLN
70.0000 mg/m2 | Freq: Once | INTRAVENOUS | Status: AC
  Administered 2019-12-20: 138 mg via INTRAVENOUS
  Filled 2019-12-20: qty 23

## 2019-12-20 MED ORDER — HEPARIN SOD (PORK) LOCK FLUSH 100 UNIT/ML IV SOLN
500.0000 [IU] | Freq: Once | INTRAVENOUS | Status: AC | PRN
  Administered 2019-12-20: 500 [IU]
  Filled 2019-12-20: qty 5

## 2019-12-20 MED ORDER — HEPARIN SOD (PORK) LOCK FLUSH 100 UNIT/ML IV SOLN
500.0000 [IU] | Freq: Once | INTRAVENOUS | Status: DC | PRN
  Filled 2019-12-20: qty 5

## 2019-12-20 NOTE — Progress Notes (Signed)
[name] Follow up:    Carol Ada, MD [address] [name] [address]   DIAGNOSIS: Cancer Staging Malignant neoplasm of 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, estrogen receptor positive ([place]) Staging form: Breast, AJCC 8th Edition - Clinical stage from 08/22/2019: Stage IA (cT1b, cN0, cM0, G2, ER+, PR+, HER2+) - Signed by Nicholas Lose, MD on 08/22/2019   SUMMARY OF ONCOLOGIC HISTORY: Oncology History  Malignant neoplasm of 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, estrogen receptor positive ([place])  08/17/2019 Initial Diagnosis   Routine screening mammogram detected a 0.8cm indeterminate mass in the right breast. Biopsy showed invasive ductal carcinoma, grade 2, HER-2 + by FISH, ER+ 100%, PR+ 70%, Ki67 15%.    08/22/2019 Cancer Staging   Staging form: Breast, AJCC 8th Edition - Clinical stage from 08/22/2019: Stage IA (cT1b, cN0, cM0, G2, ER+, PR+, HER2+) - Signed by Nicholas Lose, MD on 08/22/2019   09/07/2019 Surgery   Right lumpectomy [facility] IDC with calcifications, grade 2, 1.5cm, clear margins, 2 lymph nodes negative.    10/04/2019 -  Chemotherapy   The patient had PACLitaxel (TAXOL) 156 mg in sodium chloride 0.9 % 250 mL chemo infusion (</= 54m/m2), 80 mg/m2 = 156 mg, Intravenous,  Once, 3 of 3 cycles Dose modification: 70 mg/m2 (original dose 80 mg/m2, Cycle 2, Reason: Dose not tolerated) Administration: 156 mg (10/04/2019), 156 mg (10/10/2019), 138 mg (11/01/2019), 156 mg (10/17/2019), 138 mg (10/25/2019), 138 mg (11/09/2019), 138 mg (11/15/2019), 138 mg (11/22/2019), 138 mg (11/29/2019), 138 mg (12/06/2019), 138 mg (12/13/2019) trastuzumab-dkst (OGIVRI) 357 mg in sodium chloride 0.9 % 250 mL chemo infusion, 4 mg/kg = 357 mg, Intravenous,  Once, 3 of 16 cycles Administration: 357 mg (10/04/2019), 168 mg (10/10/2019), 168 mg (11/01/2019), 168 mg (10/17/2019), 168 mg (10/25/2019), 168 mg (11/09/2019), 168 mg (11/15/2019), 168 mg (11/22/2019),  168 mg (11/29/2019), 168 mg (12/06/2019), 168 mg (12/13/2019)  for chemotherapy treatment.      CURRENT THERAPY:Taxol/[name]  INTERVAL HISTORY: Amy PORTILLA89y.o. female returns for evaluation prior to receiving her final cycle of Taxol/[name].  She is tolerating this well.  She has radiation oncology appointment later this month.    MDessireis fatigued.  She is practicing energy conservation.  She has had no nausea or vomiting.  She has mild intermittent numbness and tingling in her fingertips and toes.  She has no motor changes.    Her most recent echocardiogram was completed on 11/22/2019 and showed an EF of 60-65%.     Patient Active Problem List   Diagnosis Date Noted  . Port-A-Cath in place 10/17/2019  . Malignant neoplasm of 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, estrogen receptor positive ([place] 08/17/2019    has No Known Allergies.  MEDICAL HISTORY: Past Medical History:  Diagnosis Date  . GERD (gastroesophageal reflux disease)   . Plantar fasciitis   . PONV (postoperative nausea and vomiting)     SURGICAL HISTORY: Past Surgical History:  Procedure Laterality Date  . BREAST LUMPECTOMY WITH RADIOACTIVE SEED AND SENTINEL LYMPH NODE BIOPSY Right 09/07/2019   Procedure: RIGHT BREAST LUMPECTOMY WITH RADIOACTIVE SEED AND SENTINEL LYMPH NODE BIOPSY;  Surgeon: BStark Klein MD;  Location: [facility]  Service: General;  Laterality: Right;  . HERNIA REPAIR    . PORTACATH PLACEMENT Left 09/07/2019   Procedure: INSERTION PORT-A-CATH WITH ULTRASOUND;  Surgeon: BStark Klein MD;  Location: [facility]  Service: General;  Laterality: Left;  SOCIAL HISTORY: Social History   Socioeconomic History  . Marital status: Widowed    Spouse name: Not on file  . Number of children: Not on file  . Years of education: Not on file  . Highest education level: Not on file  Occupational History  . Not on file  Tobacco Use  . Smoking status:  Never Smoker  . Smokeless tobacco: Never Used  Substance and Sexual Activity  . Alcohol use: Not Currently  . Drug use: Never  . Sexual activity: Not on file  Other Topics Concern  . Not on file  Social History Narrative  . Not on file   Social Determinants of Health   Financial Resource Strain:   . Difficulty of Paying Living Expenses:   Food Insecurity:   . Worried About Charity fundraiser in the Last Year:   . Arboriculturist in the Last Year:   Transportation Needs:   . Film/video editor (Medical):   Marland Kitchen Lack of Transportation (Non-Medical):   Physical Activity:   . Days of Exercise per Week:   . Minutes of Exercise per Session:   Stress:   . Feeling of Stress :   Social Connections:   . Frequency of Communication with Friends and Family:   . Frequency of Social Gatherings with Friends and Family:   . Attends Religious Services:   . Active Member of Clubs or Organizations:   . Attends Archivist Meetings:   Marland Kitchen Marital Status:   Intimate Partner Violence:   . Fear of Current or Ex-Partner:   . Emotionally Abused:   Marland Kitchen Physically Abused:   . Sexually Abused:     FAMILY HISTORY: Family History  Problem Relation Age of Onset  . Stomach cancer Father 48    Review of Systems  Constitutional: Positive for fatigue. Negative for appetite change, chills, diaphoresis, fever and unexpected weight change.  HENT:   Negative for hearing loss, lump/mass and trouble swallowing.   Eyes: Negative for eye problems and icterus.  Respiratory: Negative for chest tightness, cough and shortness of breath.   Cardiovascular: Negative for chest pain, leg swelling and palpitations.  Gastrointestinal: Negative for abdominal distention, abdominal pain, constipation, diarrhea, nausea and vomiting.  Endocrine: Negative for hot flashes.  Genitourinary: Negative for difficulty urinating.   Musculoskeletal: Negative for arthralgias.  Skin: Negative for itching and rash.   Neurological: Negative for dizziness, extremity weakness, headaches and numbness.  Hematological: Negative for adenopathy. Does not bruise/bleed easily.  Psychiatric/Behavioral: Negative for depression. The patient is not nervous/anxious.       PHYSICAL EXAMINATION  ECOG PERFORMANCE STATUS: 1 - Symptomatic but completely ambulatory  Vitals:   12/20/19 1203  BP: 140/73  Pulse: 77  Resp: 17  Temp: 98.1 F (36.7 C)  SpO2: 100%    Physical Exam Constitutional:      General: She is not in acute distress.    Appearance: Normal appearance. She is not toxic-appearing.  HENT:     Head: Normocephalic and atraumatic.  Eyes:     General: No scleral icterus. Cardiovascular:     Rate and Rhythm: Normal rate and regular rhythm.     Pulses: Normal pulses.     Heart sounds: Normal heart sounds.  Pulmonary:     Effort: Pulmonary effort is normal.     Breath sounds: Normal breath sounds.  Abdominal:     General: Abdomen is flat. Bowel sounds are normal. There is no distension.  Palpations: Abdomen is soft.     Tenderness: There is no abdominal tenderness.  Musculoskeletal:        General: No swelling.     Cervical back: Neck supple.  Lymphadenopathy:     Cervical: No cervical adenopathy.  Skin:    General: Skin is warm and dry.     Capillary Refill: Capillary refill takes less than 2 seconds.     Findings: No rash.  Neurological:     General: No focal deficit present.     Mental Status: She is alert.  Psychiatric:        Mood and Affect: Mood normal.        Behavior: Behavior normal.     LABORATORY DATA:  CBC    Component Value Date/Time   WBC 4.5 12/20/2019 1141   RBC 3.61 (L) 12/20/2019 1141   HGB 11.0 (L) 12/20/2019 1141   HCT 34.1 (L) 12/20/2019 1141   PLT 255 12/20/2019 1141   MCV 94.5 12/20/2019 1141   MCH 30.5 12/20/2019 1141   MCHC 32.3 12/20/2019 1141   RDW 15.9 (H) 12/20/2019 1141   LYMPHSABS 1.5 12/20/2019 1141   MONOABS 0.3 12/20/2019 1141    EOSABS 0.1 12/20/2019 1141   BASOSABS 0.0 12/20/2019 1141    CMP     Component Value Date/Time   NA 141 12/20/2019 1141   K 4.2 12/20/2019 1141   CL 109 12/20/2019 1141   CO2 23 12/20/2019 1141   GLUCOSE 81 12/20/2019 1141   BUN 15 12/20/2019 1141   CREATININE 0.76 12/20/2019 1141   CALCIUM 9.2 12/20/2019 1141   PROT 6.3 (L) 12/20/2019 1141   ALBUMIN 3.8 12/20/2019 1141   AST 23 12/20/2019 1141   ALT 24 12/20/2019 1141   ALKPHOS 66 12/20/2019 1141   BILITOT 0.5 12/20/2019 1141   GFRNONAA >60 12/20/2019 1141   GFRAA >60 12/20/2019 1141          ASSESSMENT and THERAPY PLAN:   Malignant neoplasm of 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, estrogen receptor positive ([place]) 09/07/2019:Right lumpectomy ([name]): IDC with calcifications, grade 2, 1.5cm, clear margins, 2 lymph nodes negative.ER 100%, PR 70%, HER-2 positive, Ki-67 15% T1c N0 stage Ia  Treatment plan: 1.Adjuvant chemotherapy with Taxol [name] weekly x12 followed by [name] maintenance for 1 year 2.Follow-up adjuvant radiation 3.Follow-up adjuvant antiestrogen therapy ---------------------------------------------------------------------------------------------------------------------------------------------------- Current treatment:Cycle12Taxol [name] Echocardiogram 08/24/2019: EF 60 to 65% Echocardiogram on 11/22/2019: EF 60-65%  Chemo toxicities: Fatigue Mild intermittent peripheral neuropathy  She will return in 3 weeks for labs, f/u with Dr. Lindi Adie and [name].  At that point she will receive maintenance [name] through 08/2020.       All questions were answered. The patient knows to call the clinic with any problems, questions or concerns. We can certainly see the patient much sooner if necessary.  Total encounter time: 20 minutes*  Wilber Bihari, NP 12/20/19 12:48 PM Medical Oncology and Hematology [facility] [place], [place]  [address] Tel. [phone]    Fax. [phone]  *Total Encounter Time as defined by the Centers for Medicare and Medicaid Services includes, in addition to the face-to-face time of a patient visit (documented in the note above) non-face-to-face time: obtaining and reviewing outside history, ordering and reviewing medications, tests or procedures, care coordination (communications with other health care professionals or caregivers) and documentation in the medical record.

## 2019-12-20 NOTE — Assessment & Plan Note (Addendum)
09/07/2019:Right lumpectomy ([name]): IDC with calcifications, grade 2, 1.5cm, clear margins, 2 lymph nodes negative.ER 100%, PR 70%, HER-2 positive, Ki-67 15% T1c N0 stage Ia  Treatment plan: 1.Adjuvant chemotherapy with Taxol [name] weekly x12 followed by [name] maintenance for 1 year 2.Follow-up adjuvant radiation 3.Follow-up adjuvant antiestrogen therapy ---------------------------------------------------------------------------------------------------------------------------------------------------- Current treatment:Cycle12Taxol [name] Echocardiogram 08/24/2019: EF 60 to 65% Echocardiogram on 11/22/2019: EF 60-65%  Chemo toxicities: Fatigue Mild intermittent peripheral neuropathy  She will return in 3 weeks for labs, f/u with Dr. gudena and [name].  At that point she will receive maintenance [name] through 08/2020.     

## 2019-12-27 ENCOUNTER — Telehealth: Payer: Self-pay | Admitting: Adult Health

## 2019-12-27 ENCOUNTER — Telehealth: Payer: Self-pay

## 2019-12-27 NOTE — Telephone Encounter (Signed)
Scheduled appts per 4/1 los. Pt confirmed appt dates and times. Pt stated that she is mychart active and will also refer to mychart for future appts. Pt had a question about upcoming treatments and was transferred to [facility] nurse.

## 2019-12-27 NOTE — Telephone Encounter (Signed)
RN returned call, no answer, mailbox full.  Will attempt mychart message to patient.

## 2020-01-04 NOTE — Progress Notes (Signed)
Pharmacist Chemotherapy Monitoring - Follow Up Assessment    I verify that I have reviewed each item in the below checklist:  . Regimen for the patient is scheduled for the appropriate day and plan matches scheduled date. Marland Kitchen Appropriate non-routine labs are ordered dependent on drug ordered. . If applicable, additional medications reviewed and ordered per protocol based on lifetime cumulative doses and/or treatment regimen.   Plan for follow-up and/or issues identified: Yes . I-vent associated with next due treatment: Yes . MD and/or nursing notified: Yes  Philomena Course 01/04/2020 9:49 AM

## 2020-01-08 ENCOUNTER — Ambulatory Visit
Admission: RE | Admit: 2020-01-08 | Discharge: 2020-01-08 | Disposition: A | Discharge status: Home / Self Care | Payer: Medicare Other | Source: Ambulatory Visit | Attending: Radiation Oncology | Admitting: Radiation Oncology

## 2020-01-08 ENCOUNTER — Other Ambulatory Visit: Payer: Self-pay

## 2020-01-08 ENCOUNTER — Encounter: Payer: Self-pay | Admitting: Radiation Oncology

## 2020-01-08 VITALS — BP 141/66 | HR 77 | Temp 98.0°F | Resp 18 | Ht 63.0 in | Wt 193.0 lb

## 2020-01-08 DIAGNOSIS — Z170 Estrogen receptor positive status [ER+]: Secondary | ICD-10-CM

## 2020-01-08 DIAGNOSIS — C50211 Malignant neoplasm of upper-inner quadrant of right female breast: Secondary | ICD-10-CM | POA: Insufficient documentation

## 2020-01-08 DIAGNOSIS — Z510 Encounter for antineoplastic radiation therapy: Secondary | ICD-10-CM | POA: Insufficient documentation

## 2020-01-08 NOTE — Progress Notes (Signed)
Radiation Oncology         ([phone]) [phone] ________________________________  Name: Amy Harrington        MRN: [id number]  Date of Service: 01/08/2020 DOB: 1950/11/28  WU:[id number], Hal Hope, MD  Nicholas Lose, MD     REFERRING PHYSICIAN: Nicholas Lose, MD   DIAGNOSIS: The encounter diagnosis was Malignant neoplasm of 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, estrogen receptor positive ([place]).   HISTORY OF PRESENT ILLNESS: Amy Harrington is a 69 y.o. female originally seen in the multidisciplinary breast clinic in October 2020 for a new diagnosis of right breast cancer. The patient was noted to have a screening detected mass in the right breast.  On mammography this was found to measure approximately 8 mm in the 3 o'clock position.  She proceeded with diagnostic imaging and ultrasound revealed a 9 mm mass at 3:00.  Her axilla was negative for adenopathy.  She underwent a biopsy on 08/08/2019 which revealed a grade 2 invasive ductal carcinoma, her tumor was triple positive with a Ki-67 of 15%.  She proceeded with right lumpectomy and sentinel node biopsy on 09/07/2019 which revealed a grade 2, 1.5 cm invasive ductal carcinoma with associated calcifications, her margins were negative with the closest being 1 mm to the posterior margin, lymphovascular space invasion was present, and 2 sampled lymph nodes were both negative for disease.  With her triple negative component she received systemic chemotherapy between 10/04/2019 through 12/20/2019.  She has plans to continue HER-2 targeted therapy until January 2022.  She is seen today to discuss the role of adjuvant radiotherapy.  PREVIOUS RADIATION THERAPY: No   PAST MEDICAL HISTORY:  Past Medical History:  Diagnosis Date  . GERD (gastroesophageal reflux disease)   . Plantar fasciitis   . PONV (postoperative nausea and vomiting)        PAST SURGICAL HISTORY: Past Surgical History:  Procedure Laterality Date  . BREAST LUMPECTOMY WITH RADIOACTIVE SEED  AND SENTINEL LYMPH NODE BIOPSY Right 09/07/2019   Procedure: RIGHT BREAST LUMPECTOMY WITH RADIOACTIVE SEED AND SENTINEL LYMPH NODE BIOPSY;  Surgeon: Stark Klein, MD;  Location: [facility];  Service: General;  Laterality: Right;  . HERNIA REPAIR    . PORTACATH PLACEMENT Left 09/07/2019   Procedure: INSERTION PORT-A-CATH WITH ULTRASOUND;  Surgeon: Stark Klein, MD;  Location: [facility];  Service: General;  Laterality: Left;     FAMILY HISTORY:  Family History  Problem Relation Age of Onset  . Stomach cancer Father 48     SOCIAL HISTORY:  reports that she has never smoked. She has never used smokeless tobacco. She reports previous alcohol use. She reports that she does not use drugs.  The patient is widowed and lives in [place]. 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   ALLERGIES: Patient has no known allergies.   MEDICATIONS:  Current Outpatient Medications  Medication Sig Dispense Refill  . esomeprazole (NEXIUM) 20 MG capsule Take 20 mg by mouth daily at 12 noon.    . lidocaine-prilocaine (EMLA) cream Apply to affected area once 30 g 3  . LORazepam (ATIVAN) 0.5 MG tablet Take 1 tablet (0.5 mg total) by mouth at bedtime as needed for sleep. 30 tablet 0  . magic mouthwash w/lidocaine SOLN Take 5 mLs by mouth 3 (three) times daily as needed for mouth pain. 200 mL 3  . ondansetron (ZOFRAN) 8 MG tablet Take 1 tablet (8 mg total) by mouth every 8 (eight) hours as needed for nausea  or vomiting. 20 tablet 0  . prochlorperazine (COMPAZINE) 10 MG tablet Take 1 tablet (10 mg total) by mouth every 6 (six) hours as needed (Nausea or vomiting). 30 tablet 1   No current facility-administered medications for this encounter.     REVIEW OF SYSTEMS: On review of systems, the patient reports that she is doing well overall. She did have some mild neuropathy in her fingertips, some fatigue, mucositis, and occasional nausea during treatment though  her symptoms are improved. She denies any chest pain, shortness of breath, cough, fevers, chills, night sweats, unintended weight changes. She denies any bowel or bladder disturbances, and denies abdominal pain, nausea or vomiting. She denies any new musculoskeletal or joint aches or pains, new skin lesions or concerns. A complete review of systems is obtained and is otherwise negative.     PHYSICAL EXAM:  Wt Readings from Last 3 Encounters:  12/20/19 194 lb 3.2 oz (88.1 kg)  12/13/19 194 lb 4 oz (88.1 kg)  12/06/19 194 lb 3.2 oz (88.1 kg)   Temp Readings from Last 3 Encounters:  12/20/19 98.1 F (36.7 C) (Temporal)  12/13/19 97.8 F (36.6 C) (Temporal)  12/06/19 97.8 F (36.6 C) (Temporal)   BP Readings from Last 3 Encounters:  12/20/19 140/73  12/13/19 135/71  12/06/19 138/71   Pulse Readings from Last 3 Encounters:  12/20/19 77  12/13/19 84  12/06/19 90    In general this is a well appearing caucasian female in no acute distress. She's alert and oriented x4 and appropriate throughout the examination. Cardiopulmonary assessment is negative for acute distress and she exhibits normal effort.  Bilateral breast exam is deferred.  ECOG = 0  0 - Asymptomatic (Fully active, able to carry on all predisease activities without restriction)  1 - Symptomatic but completely ambulatory (Restricted in physically strenuous activity but ambulatory and able to carry out work of a light or sedentary nature. For example, light housework, office work)  2 - Symptomatic, <50% in bed during the day (Ambulatory and capable of all self care but unable to carry out any work activities. Up and about more than 50% of waking hours)  3 - Symptomatic, >50% in bed, but not bedbound (Capable of only limited self-care, confined to bed or chair 50% or more of waking hours)  4 - Bedbound (Completely disabled. Cannot carry on any self-care. Totally confined to bed or chair)  5 - Death   Eustace Pen MM, Creech RH,  Tormey DC, et al. [phone]). "Toxicity and response criteria of the [facility]". [place] Oncol. 5 (6): 649-55    LABORATORY DATA:  Lab Results  Component Value Date   WBC 4.5 12/20/2019   HGB 11.0 (L) 12/20/2019   HCT 34.1 (L) 12/20/2019   MCV 94.5 12/20/2019   PLT 255 12/20/2019   Lab Results  Component Value Date   NA 141 12/20/2019   K 4.2 12/20/2019   CL 109 12/20/2019   CO2 23 12/20/2019   Lab Results  Component Value Date   ALT 24 12/20/2019   AST 23 12/20/2019   ALKPHOS 66 12/20/2019   BILITOT 0.5 12/20/2019      RADIOGRAPHY: No results found.     IMPRESSION/PLAN: 1. Stage IA, cT1bN0M0 grade 2 triple positive invasive ductal carcinoma of the right breast. Dr. Lisbeth Renshaw discusses the final results of her pathology findings and reviews the nature of triple positive breast disease.  The patient is done well since her surgery, and has since  completed the cytotoxic component of her infusion regimen.  She will continue with anti HER2 therapy.  We reviewed the rationale for radiotherapy in patients who concerns her breast, as statistically it should reduce the risks of local recurrence. She would also benefit from antiestrogen therapy as well. We discussed the risks, benefits, short, and long term effects of radiotherapy, and the patient is interested in proceeding. Dr. Lisbeth Renshaw discusses the delivery and logistics of radiotherapy and recommends a course of 4 weeks of radiotherapy.  She will proceed with simulation today. Written consent is obtained and placed in the chart, a copy was provided to the patient.  We anticipate starting her therapy the first week of May 2021.    In a visit lasting 45 minutes, greater than 50% of the time was spent face to face discussing her case, and coordinating the patient's care.  The above documentation reflects my direct findings during this shared patient visit. Please see the separate note by Dr. Lisbeth Renshaw on this date for  the remainder of the patient's plan of care.    Carola Rhine, PAC

## 2020-01-09 ENCOUNTER — Encounter: Payer: Self-pay | Admitting: *Deleted

## 2020-01-09 NOTE — Progress Notes (Signed)
Patient Care Team: Carol Ada, MD as PCP - General (Family Medicine) Rockwell Germany, RN as Oncology Nurse Navigator Tressie Ellis, Paulette Blanch, RN as Oncology Nurse Navigator Nicholas Lose, MD as Consulting Physician (Hematology and Oncology) Kyung Rudd, MD as Consulting Physician (Radiation Oncology) Stark Klein, MD as Consulting Physician (General Surgery) Kyung Rudd, MD as Consulting Physician (Radiation Oncology)  DIAGNOSIS:    ICD-10-CM   1. Healthcare maintenance  Z00.00 Lipid panel  2. Malignant neoplasm of 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, estrogen receptor positive ([place])  C50.211    Z17.0     SUMMARY OF ONCOLOGIC HISTORY: Oncology History  Malignant neoplasm of 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, estrogen receptor positive ([place])  08/17/2019 Initial Diagnosis   Routine screening mammogram detected a 0.8cm indeterminate mass in the right breast. Biopsy showed invasive ductal carcinoma, grade 2, HER-2 + by FISH, ER+ 100%, PR+ 70%, Ki67 15%.    08/22/2019 Cancer Staging   Staging form: Breast, AJCC 8th Edition - Clinical stage from 08/22/2019: Stage IA (cT1b, cN0, cM0, G2, ER+, PR+, HER2+) - Signed by Nicholas Lose, MD on 08/22/2019   09/07/2019 Surgery   Right lumpectomy [facility]): IDC with calcifications, grade 2, 1.5cm, clear margins, 2 lymph nodes negative.    10/04/2019 - 12/20/2019 Chemotherapy   The patient had PACLitaxel (TAXOL) 156 mg in sodium chloride 0.9 % 250 mL chemo infusion (</= 28m/m2), 80 mg/m2 = 156 mg, Intravenous,  Once, 3 of 3 cycles Dose modification: 70 mg/m2 (original dose 80 mg/m2, Cycle 2, Reason: Dose not tolerated) Administration: 156 mg (10/04/2019), 156 mg (10/10/2019), 138 mg (11/01/2019), 156 mg (10/17/2019), 138 mg (10/25/2019), 138 mg (11/09/2019), 138 mg (11/15/2019), 138 mg (11/22/2019), 138 mg (11/29/2019), 138 mg (12/06/2019), 138 mg (12/13/2019), 138 mg (12/20/2019) trastuzumab-dkst (OGIVRI) 357 mg in sodium chloride 0.9 % 250 mL  chemo infusion, 4 mg/kg = 357 mg, Intravenous,  Once, 3 of 16 cycles Administration: 357 mg (10/04/2019), 168 mg (10/10/2019), 168 mg (11/01/2019), 168 mg (10/17/2019), 168 mg (10/25/2019), 168 mg (11/09/2019), 168 mg (11/15/2019), 168 mg (11/22/2019), 168 mg (11/29/2019), 168 mg (12/06/2019), 168 mg (12/13/2019), 168 mg (12/20/2019)  for chemotherapy treatment.       CHIEF COMPLIANT: [name] maintenance   INTERVAL HISTORY: Amy FULLAMis a 69y.o. with above-mentioned history of right breast cancer who underwent a lumpectomy, adjuvant chemotherapy, and is currently on [name] maintenance. She presents to the clinic today for treatment. Shes feeling better from the effects of previous chemotherapy.  She is getting set up to start radiation soon.  ALLERGIES:  has No Known Allergies.  MEDICATIONS:  Current Outpatient Medications  Medication Sig Dispense Refill  . esomeprazole (NEXIUM) 20 MG capsule Take 20 mg by mouth daily at 12 noon.    . lidocaine-prilocaine (EMLA) cream Apply to affected area once 30 g 3  . LORazepam (ATIVAN) 0.5 MG tablet Take 1 tablet (0.5 mg total) by mouth at bedtime as needed for sleep. 30 tablet 0  . magic mouthwash w/lidocaine SOLN Take 5 mLs by mouth 3 (three) times daily as needed for mouth pain. 200 mL 3  . ondansetron (ZOFRAN) 8 MG tablet Take 1 tablet (8 mg total) by mouth every 8 (eight) hours as needed for nausea or vomiting. 20 tablet 0  . prochlorperazine (COMPAZINE) 10 MG tablet Take 1 tablet (10 mg total) by mouth every 6 (six) hours as needed (Nausea or vomiting). 30 tablet 1   No current facility-administered medications for this visit.  PHYSICAL EXAMINATION: ECOG PERFORMANCE STATUS: 1 - Symptomatic but completely ambulatory  Vitals:   01/10/20 1111  BP: 134/70  Pulse: 77  Resp: 18  Temp: 98 F (36.7 C)  SpO2: 99%   Filed Weights   01/10/20 1111  Weight: 193 lb (87.5 kg)    LABORATORY DATA:  I have reviewed the data as listed CMP Latest Ref  Rng & Units 12/20/2019 12/13/2019 12/06/2019  Glucose 70 - 99 mg/dL 81 110(H) 95  BUN 8 - 23 mg/dL 15 16 12   Creatinine 0.44 - 1.00 mg/dL 0.76 0.78 0.77  Sodium 135 - 145 mmol/L 141 140 139  Potassium 3.5 - 5.1 mmol/L 4.2 3.8 4.3  Chloride 98 - 111 mmol/L 109 107 108  CO2 22 - 32 mmol/L 23 25 24   Calcium 8.9 - 10.3 mg/dL 9.2 9.0 8.8(L)  Total Protein 6.5 - 8.1 g/dL 6.3(L) 6.1(L) 6.0(L)  Total Bilirubin 0.3 - 1.2 mg/dL 0.5 0.5 0.7  Alkaline Phos 38 - 126 U/L 66 68 63  AST 15 - 41 U/L 23 21 25   ALT 0 - 44 U/L 24 26 28     Lab Results  Component Value Date   WBC 6.4 01/10/2020   HGB 12.2 01/10/2020   HCT 37.3 01/10/2020   MCV 92.1 01/10/2020   PLT 224 01/10/2020   NEUTROABS 3.9 01/10/2020    ASSESSMENT & PLAN:  Malignant neoplasm of 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, estrogen receptor positive ([place]) 09/07/2019:Right lumpectomy ([name]): IDC with calcifications, grade 2, 1.5cm, clear margins, 2 lymph nodes negative.ER 100%, PR 70%, HER-2 positive, Ki-67 15% T1c N0 stage Ia  Treatment plan: 1.Adjuvant chemotherapy with Taxol [name] weekly x12 completed 12/20/2019 followed by [name] maintenance for 1 year 2.Follow-up adjuvant radiation 3.Follow-up adjuvant antiestrogen therapy ---------------------------------------------------------------------------------------------------------------------------------------------------- Current treatment:[name] maintenance Echocardiogram 08/24/2019: EF 60 to 65% Echocardiogram on 11/22/2019: EF 60-65%  [name] toxicities:None Mild intermittent peripheral neuropathy: Monitoring Mild diarrhea  Return to clinic every 3 weeks for [name] every 6 weeks to follow-up with me with labs.  Orders Placed This Encounter  Procedures  . Lipid panel    Standing Status:   Future    Standing Expiration Date:   01/09/2021   The patient has a good understanding of the overall plan. she agrees with it. she will call with any  problems that may develop before the next visit here.  Total time spent: 30 mins including face to face time and time spent for planning, charting and coordination of care  Nicholas Lose, MD 01/10/2020  I, Cloyde Reams Dorshimer, am acting as scribe for Dr. Nicholas Lose.  I have reviewed the above documentation for accuracy and completeness, and I agree with the above.

## 2020-01-10 ENCOUNTER — Inpatient Hospital Stay: Payer: Medicare Other

## 2020-01-10 ENCOUNTER — Inpatient Hospital Stay (HOSPITAL_BASED_OUTPATIENT_CLINIC_OR_DEPARTMENT_OTHER): Payer: Medicare Other | Admitting: Hematology and Oncology

## 2020-01-10 ENCOUNTER — Other Ambulatory Visit: Payer: Self-pay

## 2020-01-10 VITALS — BP 134/70 | HR 77 | Temp 98.0°F | Resp 18 | Ht 63.0 in | Wt 193.0 lb

## 2020-01-10 DIAGNOSIS — Z170 Estrogen receptor positive status [ER+]: Secondary | ICD-10-CM

## 2020-01-10 DIAGNOSIS — C50211 Malignant neoplasm of upper-inner quadrant of right female breast: Secondary | ICD-10-CM

## 2020-01-10 DIAGNOSIS — Z0000 Encounter for general adult medical examination without abnormal findings: Secondary | ICD-10-CM | POA: Diagnosis not present

## 2020-01-10 DIAGNOSIS — Z08 Encounter for follow-up examination after completed treatment for malignant neoplasm: Secondary | ICD-10-CM | POA: Diagnosis not present

## 2020-01-10 DIAGNOSIS — Z95828 Presence of other vascular implants and grafts: Secondary | ICD-10-CM

## 2020-01-10 DIAGNOSIS — Z5112 Encounter for antineoplastic immunotherapy: Secondary | ICD-10-CM | POA: Diagnosis not present

## 2020-01-10 LAB — CMP (CANCER CENTER ONLY)
ALT: 20 U/L (ref 0–44)
AST: 21 U/L (ref 15–41)
Albumin: 3.7 g/dL (ref 3.5–5.0)
Alkaline Phosphatase: 70 U/L (ref 38–126)
Anion gap: 6 (ref 5–15)
BUN: 14 mg/dL (ref 8–23)
CO2: 26 mmol/L (ref 22–32)
Calcium: 9.2 mg/dL (ref 8.9–10.3)
Chloride: 107 mmol/L (ref 98–111)
Creatinine: 0.78 mg/dL (ref 0.44–1.00)
GFR, Est AFR Am: >60 mL/min (ref >60)
GFR, Estimated: >60 mL/min (ref >60)
Glucose, Bld: 80 mg/dL (ref 70–99)
Potassium: 4.2 mmol/L (ref 3.5–5.1)
Sodium: 139 mmol/L (ref 135–145)
Total Bilirubin: 0.5 mg/dL (ref 0.3–1.2)
Total Protein: 6.5 g/dL (ref 6.5–8.1)

## 2020-01-10 LAB — CBC WITH DIFFERENTIAL (CANCER CENTER ONLY)
Abs Immature Granulocytes: 0.07 10*3/uL (ref 0.00–0.07)
Basophils Absolute: 0.1 10*3/uL (ref 0.0–0.1)
Basophils Relative: 1 %
Eosinophils Absolute: 0.1 10*3/uL (ref 0.0–0.5)
Eosinophils Relative: 2 %
HCT: 37.3 % (ref 36.0–46.0)
Hemoglobin: 12.2 g/dL (ref 12.0–15.0)
Immature Granulocytes: 1 %
Lymphocytes Relative: 26 %
Lymphs Abs: 1.7 10*3/uL (ref 0.7–4.0)
MCH: 30.1 pg (ref 26.0–34.0)
MCHC: 32.7 g/dL (ref 30.0–36.0)
MCV: 92.1 fL (ref 80.0–100.0)
Monocytes Absolute: 0.6 10*3/uL (ref 0.1–1.0)
Monocytes Relative: 10 %
Neutro Abs: 3.9 10*3/uL (ref 1.7–7.7)
Neutrophils Relative %: 60 %
Platelet Count: 224 10*3/uL (ref 150–400)
RBC: 4.05 MIL/uL (ref 3.87–5.11)
RDW: 15 % (ref 11.5–15.5)
WBC Count: 6.4 10*3/uL (ref 4.0–10.5)
nRBC: 0 % (ref 0.0–0.2)

## 2020-01-10 MED ORDER — DIPHENHYDRAMINE HCL 25 MG PO CAPS
50.0000 mg | ORAL_CAPSULE | Freq: Once | ORAL | Status: AC
  Administered 2020-01-10: 12:00:00 50 mg via ORAL

## 2020-01-10 MED ORDER — DIPHENHYDRAMINE HCL 25 MG PO CAPS
ORAL_CAPSULE | ORAL | Status: AC
  Filled 2020-01-10: qty 1

## 2020-01-10 MED ORDER — ACETAMINOPHEN 325 MG PO TABS
ORAL_TABLET | ORAL | Status: AC
  Filled 2020-01-10: qty 2

## 2020-01-10 MED ORDER — SODIUM CHLORIDE 0.9% FLUSH
10.0000 mL | INTRAVENOUS | Status: DC | PRN
  Administered 2020-01-10: 10 mL
  Filled 2020-01-10: qty 10

## 2020-01-10 MED ORDER — TRASTUZUMAB-DKST CHEMO 150 MG IV SOLR
6.0000 mg/kg | Freq: Once | INTRAVENOUS | Status: AC
  Administered 2020-01-10: 525 mg via INTRAVENOUS
  Filled 2020-01-10: qty 25

## 2020-01-10 MED ORDER — ACETAMINOPHEN 325 MG PO TABS
650.0000 mg | ORAL_TABLET | Freq: Once | ORAL | Status: AC
  Administered 2020-01-10: 12:00:00 650 mg via ORAL

## 2020-01-10 MED ORDER — SODIUM CHLORIDE 0.9 % IV SOLN
Freq: Once | INTRAVENOUS | Status: AC
  Filled 2020-01-10: qty 250

## 2020-01-10 MED ORDER — DIPHENHYDRAMINE HCL 25 MG PO CAPS
ORAL_CAPSULE | ORAL | Status: AC
  Filled 2020-01-10: qty 2

## 2020-01-10 MED ORDER — HEPARIN SOD (PORK) LOCK FLUSH 100 UNIT/ML IV SOLN
500.0000 [IU] | Freq: Once | INTRAVENOUS | Status: AC | PRN
  Administered 2020-01-10: 13:00:00 500 [IU]
  Filled 2020-01-10: qty 5

## 2020-01-10 NOTE — Progress Notes (Signed)
01/10/20  Dr Lindi Adie will not reload the trastuzumab dose today.  Henreitta Leber, PharmD

## 2020-01-10 NOTE — Assessment & Plan Note (Signed)
09/07/2019:Right lumpectomy [facility]): IDC with calcifications, grade 2, 1.5cm, clear margins, 2 lymph nodes negative.ER 100%, PR 70%, HER-2 positive, Ki-67 15% T1c N0 stage Ia  Treatment plan: 1.Adjuvant chemotherapy with Taxol [name] weekly x12 completed 12/20/2019 followed by [name] maintenance for 1 year 2.Follow-up adjuvant radiation 3.Follow-up adjuvant antiestrogen therapy ---------------------------------------------------------------------------------------------------------------------------------------------------- Current treatment:[name] maintenance Echocardiogram 08/24/2019: EF 60 to 65% Echocardiogram on 11/22/2019: EF 60-65%  [name] toxicities:None Mild intermittent peripheral neuropathy: Monitoring  Return to clinic every 3 weeks for [name] every 6 weeks to follow-up with me with labs.

## 2020-01-10 NOTE — Patient Instructions (Signed)

## 2020-01-10 NOTE — Patient Instructions (Signed)
[facility] Discharge Instructions for Patients Receiving Chemotherapy  Today you received the following Immunotherapy agent: Trastuzumab-dkst (OGIVRI)  To help prevent nausea and vomiting after your treatment, we encourage you to take your nausea medication as directed by your MD.   If you develop nausea and vomiting that is not controlled by your nausea medication, call the clinic.   BELOW ARE SYMPTOMS THAT SHOULD BE REPORTED IMMEDIATELY:  *FEVER GREATER THAN 100.5 F  *CHILLS WITH OR WITHOUT FEVER  NAUSEA AND VOMITING THAT IS NOT CONTROLLED WITH YOUR NAUSEA MEDICATION  *UNUSUAL SHORTNESS OF BREATH  *UNUSUAL BRUISING OR BLEEDING  TENDERNESS IN MOUTH AND THROAT WITH OR WITHOUT PRESENCE OF ULCERS  *URINARY PROBLEMS  *BOWEL PROBLEMS  UNUSUAL RASH Items with * indicate a potential emergency and should be followed up as soon as possible.  Feel free to call the clinic should you have any questions or concerns. The clinic phone number is [phone].  Please show the [facility] at check-in to the Emergency Department and triage nurse.  Coronavirus (COVID-19) Are you at risk?  Are you at risk for the Coronavirus (COVID-19)?  To be considered HIGH RISK for Coronavirus (COVID-19), you have to meet the following criteria:  . Traveled to [place], [place], [place], [place] or [place]; or in the [place] to [place], [place], [place], or [place]; and have fever, cough, and shortness of breath within the last 2 weeks of travel OR . Been in close contact with a person diagnosed with COVID-19 within the last 2 weeks and have fever, cough, and shortness of breath . IF YOU DO NOT MEET THESE CRITERIA, YOU ARE CONSIDERED LOW RISK FOR COVID-19.  What to do if you are HIGH RISK for COVID-19?  Marland Kitchen If you are having a medical emergency, call 911. . Seek medical care right away. Before you go to a doctor's office, urgent care or emergency department, call ahead  and tell them about your recent travel, contact with someone diagnosed with COVID-19, and your symptoms. You should receive instructions from your physician's office regarding next steps of care.  . When you arrive at healthcare provider, tell the healthcare staff immediately you have returned from visiting [place], [place], [place], [place] or [place]; or traveled in the [place] to [place], [place], [place], or [place]; in the last two weeks or you have been in close contact with a person diagnosed with COVID-19 in the last 2 weeks.   . Tell the health care staff about your symptoms: fever, cough and shortness of breath. . After you have been seen by a medical provider, you will be either: o Tested for (COVID-19) and discharged home on quarantine except to seek medical care if symptoms worsen, and asked to  - Stay home and avoid contact with others until you get your results (4-5 days)  - Avoid travel on public transportation if possible (such as bus, train, or airplane) or o Sent to the Emergency Department by EMS for evaluation, COVID-19 testing, and possible admission depending on your condition and test results.  What to do if you are LOW RISK for COVID-19?  Reduce your risk of any infection by using the same precautions used for avoiding the common cold or flu:  Marland Kitchen Wash your hands often with soap and warm water for at least 20 seconds.  If soap and water are not readily available, use an alcohol-based hand sanitizer with at least 60% alcohol.  Marland Kitchen  If coughing or sneezing, cover your mouth and nose by coughing or sneezing into the elbow areas of your shirt or coat, into a tissue or into your sleeve (not your hands). . Avoid shaking hands with others and consider head nods or verbal greetings only. . Avoid touching your eyes, nose, or mouth with unwashed hands.  . Avoid close contact with people who are sick. . Avoid places or events with large numbers of people in one location, like  concerts or sporting events. . Carefully consider travel plans you have or are making. . If you are planning any travel outside or inside the [place], visit the CDC's Travelers' Health webpage for the latest health notices. . If you have some symptoms but not all symptoms, continue to monitor at home and seek medical attention if your symptoms worsen. . If you are having a medical emergency, call 911.   ADDITIONAL HEALTHCARE OPTIONS FOR PATIENTS  [facility] Tele[facility] / e-Visit: [email or web address]         [facility]: [phone]  [facility]: [phone]                   [facility]: [phone]  

## 2020-01-11 DIAGNOSIS — Z510 Encounter for antineoplastic radiation therapy: Secondary | ICD-10-CM | POA: Diagnosis not present

## 2020-01-16 ENCOUNTER — Ambulatory Visit
Admission: RE | Admit: 2020-01-16 | Discharge: 2020-01-16 | Disposition: A | Discharge status: Home / Self Care | Payer: Medicare Other | Source: Ambulatory Visit | Attending: Radiation Oncology | Admitting: Radiation Oncology

## 2020-01-16 ENCOUNTER — Other Ambulatory Visit: Payer: Self-pay

## 2020-01-16 DIAGNOSIS — Z510 Encounter for antineoplastic radiation therapy: Secondary | ICD-10-CM | POA: Diagnosis not present

## 2020-01-17 ENCOUNTER — Ambulatory Visit
Admission: RE | Admit: 2020-01-17 | Discharge: 2020-01-17 | Disposition: A | Discharge status: Home / Self Care | Payer: Medicare Other | Source: Ambulatory Visit | Attending: Radiation Oncology | Admitting: Radiation Oncology

## 2020-01-17 ENCOUNTER — Other Ambulatory Visit: Payer: Self-pay

## 2020-01-17 DIAGNOSIS — Z510 Encounter for antineoplastic radiation therapy: Secondary | ICD-10-CM | POA: Diagnosis not present

## 2020-01-18 ENCOUNTER — Ambulatory Visit
Admission: RE | Admit: 2020-01-18 | Discharge: 2020-01-18 | Disposition: A | Discharge status: Home / Self Care | Payer: Medicare Other | Source: Ambulatory Visit | Attending: Radiation Oncology | Admitting: Radiation Oncology

## 2020-01-18 ENCOUNTER — Other Ambulatory Visit: Payer: Self-pay

## 2020-01-18 DIAGNOSIS — Z170 Estrogen receptor positive status [ER+]: Secondary | ICD-10-CM

## 2020-01-18 DIAGNOSIS — Z510 Encounter for antineoplastic radiation therapy: Secondary | ICD-10-CM | POA: Diagnosis not present

## 2020-01-18 DIAGNOSIS — C50211 Malignant neoplasm of upper-inner quadrant of right female breast: Secondary | ICD-10-CM

## 2020-01-18 MED ORDER — SONAFINE EX EMUL
1.0000 "application " | Freq: Once | CUTANEOUS | Status: AC
  Administered 2020-01-18: 1 via TOPICAL

## 2020-01-18 MED ORDER — ALRA NON-METALLIC DEODORANT (RAD-ONC)
1.0000 "application " | Freq: Once | TOPICAL | Status: AC
  Administered 2020-01-18: 1 via TOPICAL

## 2020-01-18 NOTE — Progress Notes (Signed)
Pt here for patient teaching.  Pt given Radiation and You booklet, skin care instructions, Alra deodorant and Sonafine.  Reviewed areas of pertinence such as fatigue, hair loss, skin changes, breast tenderness and breast swelling . Pt able to give teach back of to pat skin and use unscented/gentle soap,apply Sonafine bid, avoid applying anything to skin within 4 hours of treatment, avoid wearing an under wire bra and to use an electric razor if they must shave. Pt verbalizes understanding of information given and will contact nursing with any questions or concerns.     Gar Glance M. Ival Basquez RN, BSN             

## 2020-01-21 ENCOUNTER — Ambulatory Visit
Admission: RE | Admit: 2020-01-21 | Discharge: 2020-01-21 | Disposition: A | Discharge status: Home / Self Care | Payer: Medicare Other | Source: Ambulatory Visit | Attending: Radiation Oncology | Admitting: Radiation Oncology

## 2020-01-21 ENCOUNTER — Other Ambulatory Visit: Payer: Self-pay

## 2020-01-21 DIAGNOSIS — Z510 Encounter for antineoplastic radiation therapy: Secondary | ICD-10-CM | POA: Diagnosis not present

## 2020-01-21 DIAGNOSIS — C50211 Malignant neoplasm of upper-inner quadrant of right female breast: Secondary | ICD-10-CM | POA: Diagnosis present

## 2020-01-22 ENCOUNTER — Other Ambulatory Visit: Payer: Self-pay

## 2020-01-22 ENCOUNTER — Ambulatory Visit
Admission: RE | Admit: 2020-01-22 | Discharge: 2020-01-22 | Disposition: A | Discharge status: Home / Self Care | Payer: Medicare Other | Source: Ambulatory Visit | Attending: Radiation Oncology | Admitting: Radiation Oncology

## 2020-01-22 DIAGNOSIS — Z510 Encounter for antineoplastic radiation therapy: Secondary | ICD-10-CM | POA: Diagnosis not present

## 2020-01-23 ENCOUNTER — Other Ambulatory Visit: Payer: Self-pay

## 2020-01-23 ENCOUNTER — Ambulatory Visit
Admission: RE | Admit: 2020-01-23 | Discharge: 2020-01-23 | Disposition: A | Discharge status: Home / Self Care | Payer: Medicare Other | Source: Ambulatory Visit | Attending: Radiation Oncology | Admitting: Radiation Oncology

## 2020-01-23 DIAGNOSIS — Z510 Encounter for antineoplastic radiation therapy: Secondary | ICD-10-CM | POA: Diagnosis not present

## 2020-01-24 ENCOUNTER — Ambulatory Visit
Admission: RE | Admit: 2020-01-24 | Discharge: 2020-01-24 | Disposition: A | Discharge status: Home / Self Care | Payer: Medicare Other | Source: Ambulatory Visit | Attending: Radiation Oncology | Admitting: Radiation Oncology

## 2020-01-24 ENCOUNTER — Other Ambulatory Visit: Payer: Self-pay

## 2020-01-24 DIAGNOSIS — Z510 Encounter for antineoplastic radiation therapy: Secondary | ICD-10-CM | POA: Diagnosis not present

## 2020-01-25 ENCOUNTER — Ambulatory Visit
Admission: RE | Admit: 2020-01-25 | Discharge: 2020-01-25 | Disposition: A | Discharge status: Home / Self Care | Payer: Medicare Other | Source: Ambulatory Visit | Attending: Radiation Oncology | Admitting: Radiation Oncology

## 2020-01-25 ENCOUNTER — Other Ambulatory Visit: Payer: Self-pay

## 2020-01-25 DIAGNOSIS — Z510 Encounter for antineoplastic radiation therapy: Secondary | ICD-10-CM | POA: Diagnosis not present

## 2020-01-28 ENCOUNTER — Other Ambulatory Visit: Payer: Self-pay

## 2020-01-28 ENCOUNTER — Ambulatory Visit
Admission: RE | Admit: 2020-01-28 | Discharge: 2020-01-28 | Disposition: A | Discharge status: Home / Self Care | Payer: Medicare Other | Source: Ambulatory Visit | Attending: Radiation Oncology | Admitting: Radiation Oncology

## 2020-01-28 DIAGNOSIS — Z510 Encounter for antineoplastic radiation therapy: Secondary | ICD-10-CM | POA: Diagnosis not present

## 2020-01-29 ENCOUNTER — Ambulatory Visit
Admission: RE | Admit: 2020-01-29 | Discharge: 2020-01-29 | Disposition: A | Discharge status: Home / Self Care | Payer: Medicare Other | Source: Ambulatory Visit | Attending: Radiation Oncology | Admitting: Radiation Oncology

## 2020-01-29 ENCOUNTER — Other Ambulatory Visit: Payer: Self-pay

## 2020-01-29 DIAGNOSIS — Z510 Encounter for antineoplastic radiation therapy: Secondary | ICD-10-CM | POA: Diagnosis not present

## 2020-01-30 ENCOUNTER — Other Ambulatory Visit: Payer: Self-pay

## 2020-01-30 ENCOUNTER — Ambulatory Visit
Admission: RE | Admit: 2020-01-30 | Discharge: 2020-01-30 | Disposition: A | Discharge status: Home / Self Care | Payer: Medicare Other | Source: Ambulatory Visit | Attending: Radiation Oncology | Admitting: Radiation Oncology

## 2020-01-30 DIAGNOSIS — Z510 Encounter for antineoplastic radiation therapy: Secondary | ICD-10-CM | POA: Diagnosis not present

## 2020-01-31 ENCOUNTER — Other Ambulatory Visit: Payer: Self-pay

## 2020-01-31 ENCOUNTER — Ambulatory Visit
Admission: RE | Admit: 2020-01-31 | Discharge: 2020-01-31 | Disposition: A | Discharge status: Home / Self Care | Payer: Medicare Other | Source: Ambulatory Visit | Attending: Radiation Oncology | Admitting: Radiation Oncology

## 2020-01-31 ENCOUNTER — Inpatient Hospital Stay: Payer: Medicare Other | Attending: Hematology and Oncology

## 2020-01-31 VITALS — BP 134/72 | HR 73 | Temp 98.0°F | Resp 16 | Ht 63.0 in | Wt 192.5 lb

## 2020-01-31 DIAGNOSIS — Z5112 Encounter for antineoplastic immunotherapy: Secondary | ICD-10-CM | POA: Diagnosis present

## 2020-01-31 DIAGNOSIS — C50211 Malignant neoplasm of upper-inner quadrant of right female breast: Secondary | ICD-10-CM | POA: Insufficient documentation

## 2020-01-31 DIAGNOSIS — Z170 Estrogen receptor positive status [ER+]: Secondary | ICD-10-CM

## 2020-01-31 DIAGNOSIS — Z510 Encounter for antineoplastic radiation therapy: Secondary | ICD-10-CM | POA: Diagnosis not present

## 2020-01-31 MED ORDER — HEPARIN SOD (PORK) LOCK FLUSH 100 UNIT/ML IV SOLN
500.0000 [IU] | Freq: Once | INTRAVENOUS | Status: AC | PRN
  Administered 2020-01-31: 500 [IU]
  Filled 2020-01-31: qty 5

## 2020-01-31 MED ORDER — ACETAMINOPHEN 325 MG PO TABS
650.0000 mg | ORAL_TABLET | Freq: Once | ORAL | Status: AC
  Administered 2020-01-31: 650 mg via ORAL

## 2020-01-31 MED ORDER — SODIUM CHLORIDE 0.9% FLUSH
10.0000 mL | INTRAVENOUS | Status: DC | PRN
  Administered 2020-01-31: 10 mL
  Filled 2020-01-31: qty 10

## 2020-01-31 MED ORDER — ACETAMINOPHEN 325 MG PO TABS
ORAL_TABLET | ORAL | Status: AC
  Filled 2020-01-31: qty 2

## 2020-01-31 MED ORDER — DIPHENHYDRAMINE HCL 25 MG PO CAPS
50.0000 mg | ORAL_CAPSULE | Freq: Once | ORAL | Status: AC
  Administered 2020-01-31: 50 mg via ORAL

## 2020-01-31 MED ORDER — SODIUM CHLORIDE 0.9 % IV SOLN
Freq: Once | INTRAVENOUS | Status: AC
  Filled 2020-01-31: qty 250

## 2020-01-31 MED ORDER — TRASTUZUMAB-DKST CHEMO 150 MG IV SOLR
6.0000 mg/kg | Freq: Once | INTRAVENOUS | Status: AC
  Administered 2020-01-31: 525 mg via INTRAVENOUS
  Filled 2020-01-31: qty 25

## 2020-01-31 MED ORDER — DIPHENHYDRAMINE HCL 25 MG PO CAPS
ORAL_CAPSULE | ORAL | Status: AC
  Filled 2020-01-31: qty 2

## 2020-01-31 NOTE — Patient Instructions (Signed)
Cancer Center °Discharge Instructions for Patients Receiving Chemotherapy ° °Today you received the following chemotherapy agents Trastuzumab ° °To help prevent nausea and vomiting after your treatment, we encourage you to take your nausea medication as directed. °  °If you develop nausea and vomiting that is not controlled by your nausea medication, call the clinic.  ° °BELOW ARE SYMPTOMS THAT SHOULD BE REPORTED IMMEDIATELY: °· *FEVER GREATER THAN 100.5 F °· *CHILLS WITH OR WITHOUT FEVER °· NAUSEA AND VOMITING THAT IS NOT CONTROLLED WITH YOUR NAUSEA MEDICATION °· *UNUSUAL SHORTNESS OF BREATH °· *UNUSUAL BRUISING OR BLEEDING °· TENDERNESS IN MOUTH AND THROAT WITH OR WITHOUT PRESENCE OF ULCERS °· *URINARY PROBLEMS °· *BOWEL PROBLEMS °· UNUSUAL RASH °Items with * indicate a potential emergency and should be followed up as soon as possible. ° °Feel free to call the clinic should you have any questions or concerns. The clinic phone number is [phone]. ° °Please show the CHEMO ALERT CARD at check-in to the Emergency Department and triage nurse. ° ° °

## 2020-02-01 ENCOUNTER — Other Ambulatory Visit: Payer: Self-pay

## 2020-02-01 ENCOUNTER — Ambulatory Visit
Admission: RE | Admit: 2020-02-01 | Discharge: 2020-02-01 | Disposition: A | Discharge status: Home / Self Care | Payer: Medicare Other | Source: Ambulatory Visit | Attending: Radiation Oncology | Admitting: Radiation Oncology

## 2020-02-01 ENCOUNTER — Ambulatory Visit: Payer: Medicare Other | Admitting: Radiation Oncology

## 2020-02-01 DIAGNOSIS — Z510 Encounter for antineoplastic radiation therapy: Secondary | ICD-10-CM | POA: Diagnosis not present

## 2020-02-04 ENCOUNTER — Ambulatory Visit
Admission: RE | Admit: 2020-02-04 | Discharge: 2020-02-04 | Disposition: A | Discharge status: Home / Self Care | Payer: Medicare Other | Source: Ambulatory Visit | Attending: Radiation Oncology | Admitting: Radiation Oncology

## 2020-02-04 ENCOUNTER — Other Ambulatory Visit: Payer: Self-pay

## 2020-02-04 DIAGNOSIS — Z510 Encounter for antineoplastic radiation therapy: Secondary | ICD-10-CM | POA: Diagnosis not present

## 2020-02-05 ENCOUNTER — Other Ambulatory Visit: Payer: Self-pay

## 2020-02-05 ENCOUNTER — Ambulatory Visit
Admission: RE | Admit: 2020-02-05 | Discharge: 2020-02-05 | Disposition: A | Discharge status: Home / Self Care | Payer: Medicare Other | Source: Ambulatory Visit | Attending: Radiation Oncology | Admitting: Radiation Oncology

## 2020-02-05 DIAGNOSIS — Z510 Encounter for antineoplastic radiation therapy: Secondary | ICD-10-CM | POA: Diagnosis not present

## 2020-02-06 ENCOUNTER — Other Ambulatory Visit: Payer: Self-pay

## 2020-02-06 ENCOUNTER — Ambulatory Visit
Admission: RE | Admit: 2020-02-06 | Discharge: 2020-02-06 | Disposition: A | Discharge status: Home / Self Care | Payer: Medicare Other | Source: Ambulatory Visit | Attending: Radiation Oncology | Admitting: Radiation Oncology

## 2020-02-06 DIAGNOSIS — Z510 Encounter for antineoplastic radiation therapy: Secondary | ICD-10-CM | POA: Diagnosis not present

## 2020-02-07 ENCOUNTER — Ambulatory Visit
Admission: RE | Admit: 2020-02-07 | Discharge: 2020-02-07 | Disposition: A | Discharge status: Home / Self Care | Payer: Medicare Other | Source: Ambulatory Visit | Attending: Radiation Oncology | Admitting: Radiation Oncology

## 2020-02-07 ENCOUNTER — Other Ambulatory Visit: Payer: Self-pay

## 2020-02-07 DIAGNOSIS — Z510 Encounter for antineoplastic radiation therapy: Secondary | ICD-10-CM | POA: Diagnosis not present

## 2020-02-08 ENCOUNTER — Ambulatory Visit
Admission: RE | Admit: 2020-02-08 | Discharge: 2020-02-08 | Disposition: A | Discharge status: Home / Self Care | Payer: Medicare Other | Source: Ambulatory Visit | Attending: Radiation Oncology | Admitting: Radiation Oncology

## 2020-02-08 ENCOUNTER — Other Ambulatory Visit: Payer: Self-pay

## 2020-02-08 DIAGNOSIS — Z510 Encounter for antineoplastic radiation therapy: Secondary | ICD-10-CM | POA: Diagnosis not present

## 2020-02-11 ENCOUNTER — Ambulatory Visit
Admission: RE | Admit: 2020-02-11 | Discharge: 2020-02-11 | Disposition: A | Discharge status: Home / Self Care | Payer: Medicare Other | Source: Ambulatory Visit | Attending: Radiation Oncology | Admitting: Radiation Oncology

## 2020-02-11 ENCOUNTER — Encounter: Payer: Self-pay | Admitting: *Deleted

## 2020-02-11 ENCOUNTER — Other Ambulatory Visit: Payer: Self-pay

## 2020-02-11 DIAGNOSIS — Z510 Encounter for antineoplastic radiation therapy: Secondary | ICD-10-CM | POA: Diagnosis not present

## 2020-02-12 ENCOUNTER — Ambulatory Visit
Admission: RE | Admit: 2020-02-12 | Discharge: 2020-02-12 | Disposition: A | Discharge status: Home / Self Care | Payer: Medicare Other | Source: Ambulatory Visit | Attending: Radiation Oncology | Admitting: Radiation Oncology

## 2020-02-12 ENCOUNTER — Other Ambulatory Visit: Payer: Self-pay

## 2020-02-12 ENCOUNTER — Encounter: Payer: Self-pay | Admitting: Radiation Oncology

## 2020-02-12 DIAGNOSIS — Z510 Encounter for antineoplastic radiation therapy: Secondary | ICD-10-CM | POA: Diagnosis not present

## 2020-02-19 ENCOUNTER — Other Ambulatory Visit: Payer: Self-pay | Admitting: Radiation Oncology

## 2020-02-19 ENCOUNTER — Encounter: Payer: Self-pay | Admitting: Radiation Oncology

## 2020-02-19 ENCOUNTER — Encounter: Payer: Self-pay | Admitting: *Deleted

## 2020-02-19 MED ORDER — SILVER SULFADIAZINE 1 % EX CREA
1.0000 | TOPICAL_CREAM | Freq: Two times a day (BID) | CUTANEOUS | 1 refills | Status: DC
Start: 2020-02-19 — End: 2020-05-27

## 2020-02-22 ENCOUNTER — Inpatient Hospital Stay: Payer: Medicare Other | Attending: Hematology and Oncology

## 2020-02-22 ENCOUNTER — Encounter: Payer: Self-pay | Admitting: Adult Health

## 2020-02-22 ENCOUNTER — Inpatient Hospital Stay (HOSPITAL_BASED_OUTPATIENT_CLINIC_OR_DEPARTMENT_OTHER): Payer: Medicare Other | Admitting: Adult Health

## 2020-02-22 ENCOUNTER — Inpatient Hospital Stay: Payer: Medicare Other

## 2020-02-22 ENCOUNTER — Other Ambulatory Visit: Payer: Self-pay

## 2020-02-22 ENCOUNTER — Telehealth: Payer: Self-pay

## 2020-02-22 VITALS — BP 135/77 | HR 77 | Temp 98.2°F | Resp 18 | Ht 63.0 in | Wt 192.2 lb

## 2020-02-22 DIAGNOSIS — C50211 Malignant neoplasm of upper-inner quadrant of right female breast: Secondary | ICD-10-CM | POA: Diagnosis present

## 2020-02-22 DIAGNOSIS — K219 Gastro-esophageal reflux disease without esophagitis: Secondary | ICD-10-CM | POA: Diagnosis not present

## 2020-02-22 DIAGNOSIS — E2839 Other primary ovarian failure: Secondary | ICD-10-CM

## 2020-02-22 DIAGNOSIS — Z95828 Presence of other vascular implants and grafts: Secondary | ICD-10-CM

## 2020-02-22 DIAGNOSIS — Z170 Estrogen receptor positive status [ER+]: Secondary | ICD-10-CM

## 2020-02-22 DIAGNOSIS — Z5112 Encounter for antineoplastic immunotherapy: Secondary | ICD-10-CM | POA: Insufficient documentation

## 2020-02-22 DIAGNOSIS — Z0000 Encounter for general adult medical examination without abnormal findings: Secondary | ICD-10-CM

## 2020-02-22 LAB — LIPID PANEL
Cholesterol: 247 mg/dL — ABNORMAL HIGH (ref 0–200)
HDL: 49 mg/dL (ref >40)
LDL Cholesterol: 172 mg/dL — ABNORMAL HIGH (ref 0–99)
Total CHOL/HDL Ratio: 5 RATIO
Triglycerides: 130 mg/dL (ref <150)
VLDL: 26 mg/dL (ref 0–40)

## 2020-02-22 LAB — CMP (CANCER CENTER ONLY)
ALT: 33 U/L (ref 0–44)
AST: 27 U/L (ref 15–41)
Albumin: 3.7 g/dL (ref 3.5–5.0)
Alkaline Phosphatase: 70 U/L (ref 38–126)
Anion gap: 8 (ref 5–15)
BUN: 17 mg/dL (ref 8–23)
CO2: 25 mmol/L (ref 22–32)
Calcium: 9.5 mg/dL (ref 8.9–10.3)
Chloride: 107 mmol/L (ref 98–111)
Creatinine: 0.79 mg/dL (ref 0.44–1.00)
GFR, Est AFR Am: >60 mL/min (ref >60)
GFR, Estimated: >60 mL/min (ref >60)
Glucose, Bld: 80 mg/dL (ref 70–99)
Potassium: 4.2 mmol/L (ref 3.5–5.1)
Sodium: 140 mmol/L (ref 135–145)
Total Bilirubin: 0.6 mg/dL (ref 0.3–1.2)
Total Protein: 6.4 g/dL — ABNORMAL LOW (ref 6.5–8.1)

## 2020-02-22 LAB — CBC WITH DIFFERENTIAL (CANCER CENTER ONLY)
Abs Immature Granulocytes: 0.03 10*3/uL (ref 0.00–0.07)
Basophils Absolute: 0 10*3/uL (ref 0.0–0.1)
Basophils Relative: 1 %
Eosinophils Absolute: 0.1 10*3/uL (ref 0.0–0.5)
Eosinophils Relative: 2 %
HCT: 39.8 % (ref 36.0–46.0)
Hemoglobin: 13.1 g/dL (ref 12.0–15.0)
Immature Granulocytes: 1 %
Lymphocytes Relative: 21 %
Lymphs Abs: 1 10*3/uL (ref 0.7–4.0)
MCH: 29.9 pg (ref 26.0–34.0)
MCHC: 32.9 g/dL (ref 30.0–36.0)
MCV: 90.9 fL (ref 80.0–100.0)
Monocytes Absolute: 0.5 10*3/uL (ref 0.1–1.0)
Monocytes Relative: 10 %
Neutro Abs: 3.2 10*3/uL (ref 1.7–7.7)
Neutrophils Relative %: 65 %
Platelet Count: 171 10*3/uL (ref 150–400)
RBC: 4.38 MIL/uL (ref 3.87–5.11)
RDW: 13.5 % (ref 11.5–15.5)
WBC Count: 4.8 10*3/uL (ref 4.0–10.5)
nRBC: 0 % (ref 0.0–0.2)

## 2020-02-22 MED ORDER — SODIUM CHLORIDE 0.9 % IV SOLN
Freq: Once | INTRAVENOUS | Status: AC
  Filled 2020-02-22: qty 250

## 2020-02-22 MED ORDER — SODIUM CHLORIDE 0.9% FLUSH
10.0000 mL | INTRAVENOUS | Status: DC | PRN
  Administered 2020-02-22: 10 mL
  Filled 2020-02-22: qty 10

## 2020-02-22 MED ORDER — TRASTUZUMAB-DKST CHEMO 150 MG IV SOLR
6.0000 mg/kg | Freq: Once | INTRAVENOUS | Status: AC
  Administered 2020-02-22: 525 mg via INTRAVENOUS
  Filled 2020-02-22: qty 25

## 2020-02-22 MED ORDER — DIPHENHYDRAMINE HCL 25 MG PO CAPS
50.0000 mg | ORAL_CAPSULE | Freq: Once | ORAL | Status: AC
  Administered 2020-02-22: 50 mg via ORAL

## 2020-02-22 MED ORDER — ANASTROZOLE 1 MG PO TABS: 1.0000 mg | ORAL_TABLET | Freq: Every day | ORAL | 6 refills | Status: DC

## 2020-02-22 MED ORDER — DIPHENHYDRAMINE HCL 25 MG PO CAPS
ORAL_CAPSULE | ORAL | Status: AC
  Filled 2020-02-22: qty 2

## 2020-02-22 MED ORDER — ACETAMINOPHEN 325 MG PO TABS
650.0000 mg | ORAL_TABLET | Freq: Once | ORAL | Status: AC
  Administered 2020-02-22: 650 mg via ORAL

## 2020-02-22 MED ORDER — ACETAMINOPHEN 325 MG PO TABS
ORAL_TABLET | ORAL | Status: AC
  Filled 2020-02-22: qty 2

## 2020-02-22 MED ORDER — HEPARIN SOD (PORK) LOCK FLUSH 100 UNIT/ML IV SOLN
500.0000 [IU] | Freq: Once | INTRAVENOUS | Status: AC | PRN
  Administered 2020-02-22: 500 [IU]
  Filled 2020-02-22: qty 5

## 2020-02-22 NOTE — Assessment & Plan Note (Addendum)
09/07/2019:Right lumpectomy [facility]): IDC with calcifications, grade 2, 1.5cm, clear margins, 2 lymph nodes negative.ER 100%, PR 70%, HER-2 positive, Ki-67 15% T1c N0 stage Ia  Treatment plan: 1.Adjuvant chemotherapy with Taxol [name] weekly x12 completed 12/20/2019 followed by [name] maintenance for 1 year 2.Adjuvant radiation completed 01/2024 3.Follow-up adjuvant antiestrogen therapy ---------------------------------------------------------------------------------------------------------------------------------------------------- Current treatment:[name] maintenance Echocardiogram 08/24/2019: EF 60 to 65% Echocardiogram on 11/22/2019: EF 60-65% Echocardiogram 02/25/2020:   [name] toxicities:None Mild intermittent peripheral neuropathy: Monitoring  Anastrozole to start in mid June, 2021.  Reviewed risks and benefits.  Bone density ordered.    Enrolled in my chart care companion AET monitoring study.  Return to clinic every 3 weeks for [name] every 6 weeks to follow-up with me with labs.

## 2020-02-22 NOTE — Patient Instructions (Signed)
[facility] Discharge Instructions for Patients Receiving Chemotherapy  Today you received the following chemotherapy agents [place]  To help prevent nausea and vomiting after your treatment, we encourage you to take your nausea medication as directed   If you develop nausea and vomiting that is not controlled by your nausea medication, call the clinic.   BELOW ARE SYMPTOMS THAT SHOULD BE REPORTED IMMEDIATELY:  *FEVER GREATER THAN 100.5 F  *CHILLS WITH OR WITHOUT FEVER  NAUSEA AND VOMITING THAT IS NOT CONTROLLED WITH YOUR NAUSEA MEDICATION  *UNUSUAL SHORTNESS OF BREATH  *UNUSUAL BRUISING OR BLEEDING  TENDERNESS IN MOUTH AND THROAT WITH OR WITHOUT PRESENCE OF ULCERS  *URINARY PROBLEMS  *BOWEL PROBLEMS  UNUSUAL RASH Items with * indicate a potential emergency and should be followed up as soon as possible.  Feel free to call the clinic should you have any questions or concerns. The clinic phone number is [phone].  Please show the [facility] at check-in to the Emergency Department and triage nurse.

## 2020-02-22 NOTE — Telephone Encounter (Signed)
Spoke with Eldridge Scot at [facility] to inform her we went fax of bone density scan orders there.

## 2020-02-22 NOTE — Progress Notes (Signed)
[facility] Follow up:    Carol Ada, MD [address] [place] [address]   DIAGNOSIS: Cancer Staging Malignant neoplasm of 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, estrogen receptor positive ([place]) Staging form: Breast, AJCC 8th Edition - Clinical stage from 08/22/2019: Stage IA (cT1b, cN0, cM0, G2, ER+, PR+, HER2+) - Signed by Nicholas Lose, MD on 08/22/2019   SUMMARY OF ONCOLOGIC HISTORY: Oncology History  Malignant neoplasm of 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, estrogen receptor positive ([place])  08/17/2019 Initial Diagnosis   Routine screening mammogram detected a 0.8cm indeterminate mass in the right breast. Biopsy showed invasive ductal carcinoma, grade 2, HER-2 + by FISH, ER+ 100%, PR+ 70%, Ki67 15%.    08/22/2019 Cancer Staging   Staging form: Breast, AJCC 8th Edition - Clinical stage from 08/22/2019: Stage IA (cT1b, cN0, cM0, G2, ER+, PR+, HER2+) - Signed by Nicholas Lose, MD on 08/22/2019   09/07/2019 Surgery   Right lumpectomy [facility]): IDC with calcifications, grade 2, 1.5cm, clear margins, 2 lymph nodes negative.    10/04/2019 -  Adjuvant Chemotherapy   Weekly Taxol and [name] x 12 completing on 12/20/2019, then maintenance [name] through the rest of 2021.    - 02/12/2020 Radiation Therapy   Adjuvant radiation therapy     CURRENT THERAPY: [name] maintenance  INTERVAL HISTORY: MAJESTA LEICHTER 69 y.o. female returns for evaluation prior to receiving [name].  She is tolerating this well.  She completed radiation on 02/12/2020.  She had some significant burning underneath her right breast and is using the silver sulfadiazine cream and it is improving well.    Her most recent echo was in March, and her next echo is scheduled with Dr. Haroldine Laws on 02/25/2020.     Patient Active Problem List   Diagnosis Date Noted  . Port-A-Cath in place 10/17/2019  . Malignant neoplasm of upper-inner quadrant of right breast  in female, estrogen receptor positive ([place]) 08/17/2019    has No Known Allergies.  MEDICAL HISTORY: Past Medical History:  Diagnosis Date  . GERD (gastroesophageal reflux disease)   . Plantar fasciitis   . PONV (postoperative nausea and vomiting)     SURGICAL HISTORY: Past Surgical History:  Procedure Laterality Date  . BREAST LUMPECTOMY WITH RADIOACTIVE SEED AND SENTINEL LYMPH NODE BIOPSY Right 09/07/2019   Procedure: RIGHT BREAST LUMPECTOMY WITH RADIOACTIVE SEED AND SENTINEL LYMPH NODE BIOPSY;  Surgeon: Stark Klein, MD;  Location: [facility];  Service: General;  Laterality: Right;  . HERNIA REPAIR    . PORTACATH PLACEMENT Left 09/07/2019   Procedure: INSERTION PORT-A-CATH WITH ULTRASOUND;  Surgeon: Stark Klein, MD;  Location: [facility];  Service: General;  Laterality: Left;    SOCIAL HISTORY: Social History   Socioeconomic History  . Marital status: Widowed    Spouse name: Not on file  . Number of children: Not on file  . Years of education: Not on file  . Highest education level: Not on file  Occupational History  . Not on file  Tobacco Use  . Smoking status: Never Smoker  . Smokeless tobacco: Never Used  Substance and Sexual Activity  . Alcohol use: Not Currently  . Drug use: Never  . Sexual activity: Not on file  Other Topics Concern  . Not on file  Social History Narrative  . Not on file   Social Determinants of Health   Financial Resource Strain:   . Difficulty of Paying Living Expenses:  Food Insecurity:   . Worried About Charity fundraiser in the Last Year:   . Arboriculturist in the Last Year:   Transportation Needs:   . Film/video editor (Medical):   Marland Kitchen Lack of Transportation (Non-Medical):   Physical Activity:   . Days of Exercise per Week:   . Minutes of Exercise per Session:   Stress:   . Feeling of Stress :   Social Connections:   . Frequency of Communication with Friends and Family:   . Frequency  of Social Gatherings with Friends and Family:   . Attends Religious Services:   . Active Member of Clubs or Organizations:   . Attends Archivist Meetings:   Marland Kitchen Marital Status:   Intimate Partner Violence:   . Fear of Current or Ex-Partner:   . Emotionally Abused:   Marland Kitchen Physically Abused:   . Sexually Abused:     FAMILY HISTORY: Family History  Problem Relation Age of Onset  . Stomach cancer Father 60    Review of Systems  Constitutional: Negative for appetite change, chills, fatigue, fever and unexpected weight change.  HENT:   Negative for hearing loss and lump/mass.   Eyes: Negative for eye problems and icterus.  Respiratory: Negative for chest tightness, cough and shortness of breath.   Cardiovascular: Negative for chest pain, leg swelling and palpitations.  Gastrointestinal: Negative for abdominal distention, abdominal pain, constipation, diarrhea, nausea and vomiting.  Endocrine: Negative for hot flashes.  Genitourinary: Negative for difficulty urinating.   Musculoskeletal: Negative for arthralgias.  Skin: Negative for itching and rash.  Neurological: Negative for dizziness, extremity weakness, headaches and numbness.  Hematological: Negative for adenopathy. Does not bruise/bleed easily.  Psychiatric/Behavioral: Negative for depression. The patient is not nervous/anxious.       PHYSICAL EXAMINATION  ECOG PERFORMANCE STATUS: 1 - Symptomatic but completely ambulatory  Vitals:   02/22/20 1206  BP: 135/77  Pulse: 77  Resp: 18  Temp: 98.2 F (36.8 C)  SpO2: 98%    Physical Exam Constitutional:      Appearance: Normal appearance.  HENT:     Head: Normocephalic and atraumatic.  Eyes:     General: No scleral icterus. Cardiovascular:     Rate and Rhythm: Normal rate.     Pulses: Normal pulses.     Heart sounds: Normal heart sounds.  Pulmonary:     Effort: Pulmonary effort is normal.     Breath sounds: Normal breath sounds.  Abdominal:     General:  Abdomen is flat. Bowel sounds are normal.     Palpations: Abdomen is soft.  Musculoskeletal:        General: No swelling.     Cervical back: Neck supple.  Lymphadenopathy:     Cervical: No cervical adenopathy.  Skin:    General: Skin is warm and dry.     Capillary Refill: Capillary refill takes less than 2 seconds.     Findings: No rash.  Neurological:     General: No focal deficit present.     Mental Status: She is alert.  Psychiatric:        Mood and Affect: Mood normal.        Behavior: Behavior normal.     LABORATORY DATA:  CBC    Component Value Date/Time   WBC 4.8 02/22/2020 1145   RBC 4.38 02/22/2020 1145   HGB 13.1 02/22/2020 1145   HCT 39.8 02/22/2020 1145   PLT 171 02/22/2020 1145   MCV  90.9 02/22/2020 1145   MCH 29.9 02/22/2020 1145   MCHC 32.9 02/22/2020 1145   RDW 13.5 02/22/2020 1145   LYMPHSABS 1.0 02/22/2020 1145   MONOABS 0.5 02/22/2020 1145   EOSABS 0.1 02/22/2020 1145   BASOSABS 0.0 02/22/2020 1145    CMP     Component Value Date/Time   NA 139 01/10/2020 1055   K 4.2 01/10/2020 1055   CL 107 01/10/2020 1055   CO2 26 01/10/2020 1055   GLUCOSE 80 01/10/2020 1055   BUN 14 01/10/2020 1055   CREATININE 0.78 01/10/2020 1055   CALCIUM 9.2 01/10/2020 1055   PROT 6.5 01/10/2020 1055   ALBUMIN 3.7 01/10/2020 1055   AST 21 01/10/2020 1055   ALT 20 01/10/2020 1055   ALKPHOS 70 01/10/2020 1055   BILITOT 0.5 01/10/2020 1055   GFRNONAA >60 01/10/2020 1055   GFRAA >60 01/10/2020 1055          ASSESSMENT and THERAPY PLAN:   Malignant neoplasm of 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, estrogen receptor positive ([place]) 09/07/2019:Right lumpectomy ([name]): IDC with calcifications, grade 2, 1.5cm, clear margins, 2 lymph nodes negative.ER 100%, PR 70%, HER-2 positive, Ki-67 15% T1c N0 stage Ia  Treatment plan: 1.Adjuvant chemotherapy with Taxol [name] weekly x12 completed 12/20/2019 followed by [name] maintenance for 1  year 2.Adjuvant radiation completed 01/2024 3.Follow-up adjuvant antiestrogen therapy ---------------------------------------------------------------------------------------------------------------------------------------------------- Current treatment:[name] maintenance Echocardiogram 08/24/2019: EF 60 to 65% Echocardiogram on 11/22/2019: EF 60-65% Echocardiogram 02/25/2020:   [name] toxicities:None Mild intermittent peripheral neuropathy: Monitoring  Anastrozole to start in mid June, 2021.  Reviewed risks and benefits.  Bone density ordered.    Enrolled in my chart care companion AET monitoring study.  Return to clinic every 3 weeks for [name] every 6 weeks to follow-up with me with labs.   Orders Placed This Encounter  Procedures  . DG Bone Density    Standing Status:   Future    Standing Expiration Date:   02/21/2021    Order Specific Question:   Reason for Exam (SYMPTOM  OR DIAGNOSIS REQUIRED)    Answer:   estrogen deficiency    Order Specific Question:   Preferred imaging location?    Answer:   External    Comments:   [facility]    All questions were answered. The patient knows to call the clinic with any problems, questions or concerns. We can certainly see the patient much sooner if necessary.  Total encounter time: 30 minutes*  Wilber Bihari, NP 02/22/20 12:40 PM Medical Oncology and Hematology [facility] [place], [place] [address] Tel. [phone]    Fax. [phone]   *Total Encounter Time as defined by the Centers for Medicare and Medicaid Services includes, in addition to the face-to-face time of a patient visit (documented in the note above) non-face-to-face time: obtaining and reviewing outside history, ordering and reviewing medications, tests or procedures, care coordination (communications with other health care professionals or caregivers) and documentation in the medical record.

## 2020-02-25 ENCOUNTER — Telehealth: Payer: Self-pay | Admitting: Adult Health

## 2020-02-25 ENCOUNTER — Encounter (HOSPITAL_COMMUNITY): Payer: Self-pay | Admitting: Internal Medicine

## 2020-02-25 ENCOUNTER — Other Ambulatory Visit: Payer: Self-pay

## 2020-02-25 ENCOUNTER — Ambulatory Visit (HOSPITAL_COMMUNITY)
Admission: RE | Admit: 2020-02-25 | Discharge: 2020-02-25 | Disposition: A | Discharge status: Home / Self Care | Payer: Medicare Other | Source: Ambulatory Visit | Attending: Internal Medicine | Admitting: Internal Medicine

## 2020-02-25 ENCOUNTER — Ambulatory Visit (HOSPITAL_BASED_OUTPATIENT_CLINIC_OR_DEPARTMENT_OTHER)
Admission: RE | Admit: 2020-02-25 | Discharge: 2020-02-25 | Disposition: A | Discharge status: Home / Self Care | Payer: Medicare Other | Source: Ambulatory Visit | Attending: Internal Medicine | Admitting: Internal Medicine

## 2020-02-25 VITALS — BP 130/84 | HR 74 | Wt 194.0 lb

## 2020-02-25 DIAGNOSIS — I517 Cardiomegaly: Secondary | ICD-10-CM | POA: Diagnosis not present

## 2020-02-25 DIAGNOSIS — Z170 Estrogen receptor positive status [ER+]: Secondary | ICD-10-CM | POA: Insufficient documentation

## 2020-02-25 DIAGNOSIS — C50211 Malignant neoplasm of upper-inner quadrant of right female breast: Secondary | ICD-10-CM

## 2020-02-25 DIAGNOSIS — K219 Gastro-esophageal reflux disease without esophagitis: Secondary | ICD-10-CM | POA: Insufficient documentation

## 2020-02-25 DIAGNOSIS — Z79899 Other long term (current) drug therapy: Secondary | ICD-10-CM | POA: Insufficient documentation

## 2020-02-25 NOTE — Progress Notes (Signed)
CARDIO-ONCOLOGY CLINIC NOTE    HPI: Amy Harrington is a 69 y/o woman with h/o GERD and R breast cancer referred by Dr. Lindi Harrington for enrollment into the cardio-oncology clinic for cardiac surveillance while receiving chemotherapy.   SUMMARY OF ONCOLOGIC HISTORY:    Oncology History  Malignant neoplasm of 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, estrogen receptor positive ([place])  08/17/2019 Initial Diagnosis   Routine screening mammogram detected a 0.8cm indeterminate mass in the right breast. Biopsy showed invasive ductal carcinoma, grade 2, HER-2 + by FISH, ER+ 100%, PR+ 70%, Ki67 15%.    08/22/2019 Cancer Staging   Staging form: Breast, AJCC 8th Edition - Clinical stage from 08/22/2019: Stage IA (cT1b, cN0, cM0, G2, ER+, PR+, HER2+) - Signed by Nicholas Lose, MD on 08/22/2019   09/07/2019 Surgery   Right lumpectomy [facility]): IDC with calcifications, grade 2, 1.5cm, clear margins, 2 lymph nodes negative.    10/04/2019 -  Chemotherapy   The patient had PACLitaxel (TAXOL) 156 mg in sodium chloride 0.9 % 250 mL chemo infusion (</= 48m/m2), 80 mg/m2 = 156 mg, Intravenous,  Once, 2 of 3 cycles Dose modification: 70 mg/m2 (original dose 80 mg/m2, Cycle 2, Reason: Dose not tolerated) Administration: 156 mg (10/04/2019), 156 mg (10/10/2019), 138 mg (11/01/2019), 156 mg (10/17/2019), 138 mg (10/25/2019), 138 mg (11/09/2019), 138 mg (11/15/2019) trastuzumab-dkst (OGIVRI) 357 mg in sodium chloride 0.9 % 250 mL chemo infusion, 4 mg/kg = 357 mg, Intravenous,  Once, 2 of 16 cycles Administration: 357 mg (10/04/2019), 168 mg (10/10/2019), 168 mg (11/01/2019), 168 mg (10/17/2019), 168 mg (10/25/2019), 168 mg (11/09/2019), 168 mg (11/15/2019)  for chemotherapy treatment.      Denies any h/o of cardiac disease. 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 Had lumpectomy in 12/20.  Remains active. No SON, orthopnea or PND. Tolerating  herceptin well. After last infusion got sleepy -thinks it was benadryl.   Echo today EF 60-65% strain underestimated due to poor tracking. Personally reviewed  ECHO 08/24/19 EF 60-65% Grade I DD    Echo 11/22/19: EF 60-65%    Past Medical History:  Diagnosis Date  . GERD (gastroesophageal reflux disease)   . Plantar fasciitis   . PONV (postoperative nausea and vomiting)     Current Outpatient Medications  Medication Sig Dispense Refill  . anastrozole (ARIMIDEX) 1 MG tablet Take 1 tablet (1 mg total) by mouth daily. 30 tablet 6  . esomeprazole (NEXIUM) 20 MG capsule Take 20 mg by mouth daily at 12 noon.    . lidocaine-prilocaine (EMLA) cream Apply to affected area once 30 g 3  . silver sulfADIAZINE (SILVADENE) 1 % cream Apply 1 application topically 2 (two) times daily. 50 g 1   No current facility-administered medications for this encounter.    No Known Allergies    Social History   Socioeconomic History  . Marital status: Widowed    Spouse name: Not on file  . Number of children: Not on file  . Years of education: Not on file  . Highest education level: Not on file  Occupational History  . Not on file  Tobacco Use  . Smoking status: Never Smoker  . Smokeless tobacco: Never Used  Substance and Sexual Activity  . Alcohol use: Not Currently  . Drug use: Never  . Sexual activity: Not on file  Other Topics Concern  . Not on file  Social History Narrative  . Not on  file   Social Determinants of Health   Financial Resource Strain:   . Difficulty of Paying Living Expenses:   Food Insecurity:   . Worried About Charity fundraiser in the Last Year:   . Arboriculturist in the Last Year:   Transportation Needs:   . Film/video editor (Medical):   Marland Kitchen Lack of Transportation (Non-Medical):   Physical Activity:   . Days of Exercise per Week:   . Minutes of Exercise per Session:   Stress:   . Feeling of Stress :   Social Connections:   . Frequency of Communication  with Friends and Family:   . Frequency of Social Gatherings with Friends and Family:   . Attends Religious Services:   . Active Member of Clubs or Organizations:   . Attends Archivist Meetings:   Marland Kitchen Marital Status:   Intimate Partner Violence:   . Fear of Current or Ex-Partner:   . Emotionally Abused:   Marland Kitchen Physically Abused:   . Sexually Abused:       Family History  Problem Relation Age of Onset  . Stomach cancer Father 72  GM died at 30 during childbirth due to "heart problems"   Vitals:   02/25/20 1151  BP: 130/84  Pulse: 74  SpO2: 96%  Weight: 88 kg (194 lb)    PHYSICAL EXAM: General:  Well appearing. No resp difficulty HEENT: normal Neck: supple. no JVD. Carotids 2+ bilat; no bruits. No lymphadenopathy or thryomegaly appreciated. Cor: PMI nondisplaced. Regular rate & rhythm. No rubs, gallops or murmurs. + port Lungs: clear Abdomen: soft, nontender, nondistended. No hepatosplenomegaly. No bruits or masses. Good bowel sounds. Extremities: no cyanosis, clubbing, rash, edema Neuro: alert & orientedx3, cranial nerves grossly intact. moves all 4 extremities w/o difficulty. Affect pleasant   ASSESSMENT & PLAN: 1. Right Breast Cancer - I reviewed echos personally. EF and Doppler parameters stable. No HF on exam. Continue [name]. Repeat echo in 3 months.  Glori Bickers, MD  12:08 PM

## 2020-02-25 NOTE — Progress Notes (Signed)
  Echocardiogram 2D Echocardiogram has been performed.  Matilde Bash 02/25/2020, 11:40 AM

## 2020-02-25 NOTE — Patient Instructions (Signed)
Follow up in 3 months with echo.

## 2020-02-25 NOTE — Telephone Encounter (Signed)
No 6/4 los. No changes made to pt's schedule.  

## 2020-02-25 NOTE — Addendum Note (Signed)
Encounter addended by: Scarlette Calico, RN on: 02/25/2020 12:21 PM  Actions taken: Clinical Note Signed

## 2020-03-04 ENCOUNTER — Telehealth: Payer: Self-pay

## 2020-03-04 NOTE — Telephone Encounter (Signed)
TC to Pt per Suzan Garibaldi NP Bone DX results given Pt has osteopenia and she recommends, taking calcium and vitamin D and weight bearing exercise. Informed Pt that test will be taken again in 2 years. Pt. verbalized information given to her. No further problems or concerns noted.

## 2020-03-11 ENCOUNTER — Encounter: Payer: Self-pay | Admitting: Radiation Oncology

## 2020-03-11 ENCOUNTER — Ambulatory Visit
Admission: RE | Admit: 2020-03-11 | Discharge: 2020-03-11 | Disposition: A | Discharge status: Home / Self Care | Payer: Medicare Other | Source: Ambulatory Visit | Attending: Radiation Oncology | Admitting: Radiation Oncology

## 2020-03-11 ENCOUNTER — Other Ambulatory Visit: Payer: Self-pay

## 2020-03-11 VITALS — BP 122/64 | HR 75 | Temp 97.4°F | Resp 20 | Ht 63.0 in | Wt 193.0 lb

## 2020-03-11 DIAGNOSIS — Z923 Personal history of irradiation: Secondary | ICD-10-CM | POA: Diagnosis not present

## 2020-03-11 DIAGNOSIS — Z79811 Long term (current) use of aromatase inhibitors: Secondary | ICD-10-CM | POA: Insufficient documentation

## 2020-03-11 DIAGNOSIS — Z170 Estrogen receptor positive status [ER+]: Secondary | ICD-10-CM | POA: Diagnosis not present

## 2020-03-11 DIAGNOSIS — C50211 Malignant neoplasm of upper-inner quadrant of right female breast: Secondary | ICD-10-CM | POA: Diagnosis not present

## 2020-03-11 NOTE — Progress Notes (Signed)
Radiation Oncology         [phone] ________________________________  Name: Amy Harrington MRN: [id number]  Date of Service: 03/11/2020  DOB: July 14, 1951  Post Treatment Note  CC: Carol Ada, MD  Carol Ada, MD  Diagnosis:  Stage IA, cT1bN0M0 grade 2 triple positive invasive ductal carcinoma of the right breast  Interval Since Last Radiation:  4 weeks   01/16/20-02/12/20: The right breast was treated to 42.56 Gy in 16 fractions followed by an 8 Gy boost in 4 fractions.  Narrative:  The patient returns today for routine follow-up. During treatment she did very well with radiotherapy and had moderate dry desquamation along the inframammary fold.  She reports her skin is doing much better since using Silvadene.  She continues her anti-HER-2 infusions and is doing well with this otherwise.  No other complaints or concerns are verbalized                       ALLERGIES:  has No Known Allergies.  Meds: Current Outpatient Medications  Medication Sig Dispense Refill   anastrozole (ARIMIDEX) 1 MG tablet Take 1 tablet (1 mg total) by mouth daily. 30 tablet 6   esomeprazole (NEXIUM) 20 MG capsule Take 20 mg by mouth daily at 12 noon.     lidocaine-prilocaine (EMLA) cream Apply to affected area once 30 g 3   silver sulfADIAZINE (SILVADENE) 1 % cream Apply 1 application topically 2 (two) times daily. 50 g 1   No current facility-administered medications for this encounter.    Physical Findings:  vitals were not taken for this visit.   /10 In general this is a well appearing Caucasian female in no acute distress. She's alert and oriented x4 and appropriate throughout the examination. Cardiopulmonary assessment is negative for acute distress and she exhibits normal effort. The right breast was examined and reveals minimal remaining hyperpigmented changes of the right breast.  There is several seborrheic keratotic changes of the inframammary fold and lateral aspect of the breast.  No  desquamation is noted.  Lab Findings: Lab Results  Component Value Date   WBC 4.8 02/22/2020   HGB 13.1 02/22/2020   HCT 39.8 02/22/2020   MCV 90.9 02/22/2020   PLT 171 02/22/2020     Radiographic Findings: ECHOCARDIOGRAM COMPLETE  Result Date: 02/25/2020    ECHOCARDIOGRAM REPORT   Patient Name:   Amy Harrington Date of Exam: 02/25/2020 Medical Rec #:  [id number]    Height:       63.0 in Accession #:    [id number]   Weight:       192.2 lb Date of Birth:  [birth year]    BSA:          1.901 m Patient Age:    69 years     BP:           135/77 mmHg Patient Gender: F            HR:           77 bpm. Exam Location:  Outpatient Procedure: 2D Echo, Cardiac Doppler and Color Doppler Indications:    Chemo evaluation  History:        Patient has prior history of Echocardiogram examinations, most                 recent 11/22/2019. Breast cancer, chemo, radiation therapy.  Sonographer:    Dustin Flock Referring Phys: [id number] DANIEL R BENSIMHON IMPRESSIONS  1. GLS - 14.0% (  underestimated due to poor endocardial tracking) . Left ventricular ejection fraction, by estimation, is 60 to 65%. The left ventricle has normal function. The left ventricle has no regional wall motion abnormalities. There is mild left ventricular hypertrophy. Left ventricular diastolic parameters are consistent with Grade I diastolic dysfunction (impaired relaxation).  2. Right ventricular systolic function is normal. The right ventricular size is normal. There is mildly elevated pulmonary artery systolic pressure.  3. The mitral valve is normal in structure. Trivial mitral valve regurgitation.  4. The aortic valve is normal in structure. Aortic valve regurgitation is trivial.  5. The inferior vena cava is normal in size with greater than 50% respiratory variability, suggesting right atrial pressure of 3 mmHg. FINDINGS  Left Ventricle: GLS - 14.0% (underestimated due to poor endocardial tracking). Left ventricular ejection fraction, by estimation, is  60 to 65%. The left ventricle has normal function. The left ventricle has no regional wall motion abnormalities. The left ventricular internal cavity size was normal in size. There is mild left ventricular hypertrophy. Left ventricular diastolic parameters are consistent with Grade I diastolic dysfunction (impaired relaxation). Right Ventricle: The right ventricular size is normal. No increase in right ventricular wall thickness. Right ventricular systolic function is normal. There is mildly elevated pulmonary artery systolic pressure. The tricuspid regurgitant velocity is 2.85  m/s, and with an assumed right atrial pressure of 8 mmHg, the estimated right ventricular systolic pressure is 95.2 mmHg. Left Atrium: Left atrial size was normal in size. Right Atrium: Right atrial size was normal in size. Pericardium: There is no evidence of pericardial effusion. Mitral Valve: The mitral valve is normal in structure. Trivial mitral valve regurgitation. Tricuspid Valve: The tricuspid valve is normal in structure. Tricuspid valve regurgitation is trivial. Aortic Valve: The aortic valve is normal in structure. Aortic valve regurgitation is trivial. Aortic regurgitation PHT measures 428 msec. Pulmonic Valve: The pulmonic valve was grossly normal. Pulmonic valve regurgitation is not visualized. Aorta: The aortic root and ascending aorta are structurally normal, with no evidence of dilitation. Venous: The inferior vena cava is normal in size with greater than 50% respiratory variability, suggesting right atrial pressure of 3 mmHg. IAS/Shunts: No atrial level shunt detected by color flow Doppler.  LEFT VENTRICLE PLAX 2D LVIDd:         4.10 cm  Diastology LVIDs:         2.80 cm  LV e' lateral:   9.36 cm/s LV PW:         1.20 cm  LV E/e' lateral: 7.5 LV IVS:        1.10 cm  LV e' medial:    7.94 cm/s LVOT diam:     2.10 cm  LV E/e' medial:  8.9 LV SV:         64 LV SV Index:   34 LVOT Area:     3.46 cm  RIGHT VENTRICLE RV Basal  diam:  3.10 cm RV S prime:     12.50 cm/s TAPSE (M-mode): 2.3 cm LEFT ATRIUM             Index       RIGHT ATRIUM           Index LA diam:        3.60 cm 1.89 cm/m  RA Area:     11.50 cm LA Vol (A2C):   31.0 ml 16.31 ml/m RA Volume:   23.50 ml  12.36 ml/m LA Vol (A4C):   55.7 ml 29.30 ml/m  LA Biplane Vol: 42.9 ml 22.56 ml/m  AORTIC VALVE LVOT Vmax:   90.70 cm/s LVOT Vmean:  53.600 cm/s LVOT VTI:    0.184 m AI PHT:      428 msec  AORTA Ao Root diam: 2.90 cm MITRAL VALVE               TRICUSPID VALVE MV Area (PHT): 3.17 cm    TR Peak grad:   32.5 mmHg MV Decel Time: 239 msec    TR Vmax:        285.00 cm/s MV E velocity: 70.30 cm/s MV A velocity: 60.40 cm/s  SHUNTS MV E/A ratio:  1.16        Systemic VTI:  0.18 m                            Systemic Diam: 2.10 cm Glori Bickers MD Electronically signed by Glori Bickers MD Signature Date/Time: 02/25/2020/12:47:12 PM    Final     Impression/Plan: 1. Stage IA, cT1bN0M0 grade 2 triple positive invasive ductal carcinoma of the right breast. The patient has been doing well since completion of radiotherapy. We discussed that we would be happy to continue to follow her as needed, but she will also continue to follow up with Dr. Lindi Adie in medical oncology. She was counseled on skin care as well as measures to avoid sun exposure to this area.  2. Survivorship. We discussed the importance of survivorship evaluation which she will be seen for upon the completion of her therapy.    Carola Rhine, PAC

## 2020-03-13 ENCOUNTER — Inpatient Hospital Stay: Payer: Medicare Other

## 2020-03-13 ENCOUNTER — Other Ambulatory Visit: Payer: Self-pay

## 2020-03-13 VITALS — BP 129/71 | HR 79 | Temp 98.4°F | Resp 18 | Wt 195.5 lb

## 2020-03-13 DIAGNOSIS — Z170 Estrogen receptor positive status [ER+]: Secondary | ICD-10-CM

## 2020-03-13 DIAGNOSIS — Z5112 Encounter for antineoplastic immunotherapy: Secondary | ICD-10-CM | POA: Diagnosis not present

## 2020-03-13 DIAGNOSIS — C50211 Malignant neoplasm of upper-inner quadrant of right female breast: Secondary | ICD-10-CM

## 2020-03-13 MED ORDER — TRASTUZUMAB-DKST CHEMO 150 MG IV SOLR
6.0000 mg/kg | Freq: Once | INTRAVENOUS | Status: AC
  Administered 2020-03-13: 525 mg via INTRAVENOUS
  Filled 2020-03-13: qty 25

## 2020-03-13 MED ORDER — ACETAMINOPHEN 325 MG PO TABS
ORAL_TABLET | ORAL | Status: AC
  Filled 2020-03-13: qty 2

## 2020-03-13 MED ORDER — ACETAMINOPHEN 325 MG PO TABS
650.0000 mg | ORAL_TABLET | Freq: Once | ORAL | Status: AC
  Administered 2020-03-13: 650 mg via ORAL

## 2020-03-13 MED ORDER — SODIUM CHLORIDE 0.9% FLUSH
10.0000 mL | INTRAVENOUS | Status: DC | PRN
  Administered 2020-03-13: 10 mL
  Filled 2020-03-13: qty 10

## 2020-03-13 MED ORDER — DIPHENHYDRAMINE HCL 25 MG PO CAPS
50.0000 mg | ORAL_CAPSULE | Freq: Once | ORAL | Status: AC
  Administered 2020-03-13: 50 mg via ORAL

## 2020-03-13 MED ORDER — SODIUM CHLORIDE 0.9 % IV SOLN
Freq: Once | INTRAVENOUS | Status: AC
  Filled 2020-03-13: qty 250

## 2020-03-13 MED ORDER — HEPARIN SOD (PORK) LOCK FLUSH 100 UNIT/ML IV SOLN
500.0000 [IU] | Freq: Once | INTRAVENOUS | Status: AC | PRN
  Administered 2020-03-13: 500 [IU]
  Filled 2020-03-13: qty 5

## 2020-03-13 MED ORDER — DIPHENHYDRAMINE HCL 25 MG PO CAPS
ORAL_CAPSULE | ORAL | Status: AC
  Filled 2020-03-13: qty 2

## 2020-03-13 NOTE — Patient Instructions (Signed)
[facility] Discharge Instructions for Patients Receiving Chemotherapy  Today you received the following chemotherapy agents: trastuzumab.  To help prevent nausea and vomiting after your treatment, we encourage you to take your nausea medication {s directed.   If you develop nausea and vomiting that is not controlled by your nausea medication, call the clinic.   BELOW ARE SYMPTOMS THAT SHOULD BE REPORTED IMMEDIATELY:  *FEVER GREATER THAN 100.5 F  *CHILLS WITH OR WITHOUT FEVER  NAUSEA AND VOMITING THAT IS NOT CONTROLLED WITH YOUR NAUSEA MEDICATION  *UNUSUAL SHORTNESS OF BREATH  *UNUSUAL BRUISING OR BLEEDING  TENDERNESS IN MOUTH AND THROAT WITH OR WITHOUT PRESENCE OF ULCERS  *URINARY PROBLEMS  *BOWEL PROBLEMS  UNUSUAL RASH Items with * indicate a potential emergency and should be followed up as soon as possible.  Feel free to call the clinic should you have any questions or concerns. The clinic phone number is [phone].  Please show the CHEMO ALERT CARD at check-in to the Emergency Department and triage nurse.   

## 2020-03-23 NOTE — Progress Notes (Signed)
  Radiation Oncology         [phone] ________________________________  Name: Amy NORRINGTON MRN: [id number]  Date: 02/12/2020  DOB: [birth year]  End of Treatment Note  Diagnosis:   right-sided breast cancer     Indication for treatment:  Curative       Radiation treatment dates:   01/16/20 - 02/12/20  Site/dose:   The patient initially received a dose of 42.56 Gy in 16 fractions to the breast using whole-breast tangent fields. This was delivered using a 3-D conformal technique. The patient then received a boost to the seroma. This delivered an additional 8 Gy in 29fractions using a 3 field photon technique due to the depth of the seroma. The total dose was 50.56 Gy.  Narrative: The patient tolerated radiation treatment relatively well.   The patient had some expected skin irritation as she progressed during treatment.    Plan: The patient has completed radiation treatment. The patient will return to radiation oncology clinic for routine followup in one month. I advised the patient to call or return sooner if they have any questions or concerns related to their recovery or treatment. ________________________________  Jodelle Gross, M.D., Ph.D.

## 2020-03-24 ENCOUNTER — Encounter (INDEPENDENT_AMBULATORY_CARE_PROVIDER_SITE_OTHER): Payer: Self-pay

## 2020-03-31 ENCOUNTER — Encounter (INDEPENDENT_AMBULATORY_CARE_PROVIDER_SITE_OTHER): Payer: Self-pay

## 2020-04-02 NOTE — Progress Notes (Signed)
Patient Care Team: Carol Ada, MD as PCP - General (Family Medicine) Rockwell Germany, RN as Oncology Nurse Navigator Tressie Ellis, Paulette Blanch, RN as Oncology Nurse Navigator Nicholas Lose, MD as Consulting Physician (Hematology and Oncology) Kyung Rudd, MD as Consulting Physician (Radiation Oncology) Stark Klein, MD as Consulting Physician (General Surgery) Kyung Rudd, MD as Consulting Physician (Radiation Oncology)  DIAGNOSIS:    ICD-10-CM   1. Malignant neoplasm of 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, estrogen receptor positive ([place])  C50.211    Z17.0     SUMMARY OF ONCOLOGIC HISTORY: Oncology History  Malignant neoplasm of 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, estrogen receptor positive ([place])  08/17/2019 Initial Diagnosis   Routine screening mammogram detected a 0.8cm indeterminate mass in the right breast. Biopsy showed invasive ductal carcinoma, grade 2, HER-2 + by FISH, ER+ 100%, PR+ 70%, Ki67 15%.    08/22/2019 Cancer Staging   Staging form: Breast, AJCC 8th Edition - Clinical stage from 08/22/2019: Stage IA (cT1b, cN0, cM0, G2, ER+, PR+, HER2+) - Signed by Nicholas Lose, MD on 08/22/2019   09/07/2019 Surgery   Right lumpectomy [facility]): IDC with calcifications, grade 2, 1.5cm, clear margins, 2 lymph nodes negative.    10/04/2019 -  Adjuvant Chemotherapy   Weekly Taxol and [name] x 12 completing on 12/20/2019, then maintenance [name] through the rest of 2021.   01/17/2020 - 02/12/2020 Radiation Therapy   Adjuvant radiation therapy     CHIEF COMPLIANT: [name] maintenance  INTERVAL HISTORY: Amy Harrington is a 69 y.o. with above-mentioned history of right breast cancer who underwent a lumpectomy, adjuvant chemotherapy, radiation, and is currently on [name] maintenance. Echo on 02/25/20 showed an ejection fraction of 60-65%. She presents to the clinic today for treatment.   ALLERGIES:  has No Known Allergies.  MEDICATIONS:  Current Outpatient  Medications  Medication Sig Dispense Refill  . anastrozole (ARIMIDEX) 1 MG tablet Take 1 tablet (1 mg total) by mouth daily. 30 tablet 6  . esomeprazole (NEXIUM) 20 MG capsule Take 20 mg by mouth daily at 12 noon.    . lidocaine-prilocaine (EMLA) cream Apply to affected area once 30 g 3  . silver sulfADIAZINE (SILVADENE) 1 % cream Apply 1 application topically 2 (two) times daily. 50 g 1   No current facility-administered medications for this visit.   Facility-Administered Medications Ordered in Other Visits  Medication Dose Route Frequency Provider Last Rate Last Admin  . sodium chloride flush (NS) 0.9 % injection 10 mL  10 mL Intracatheter PRN Nicholas Lose, MD   10 mL at 04/03/20 1348    PHYSICAL EXAMINATION: ECOG PERFORMANCE STATUS: 1 - Symptomatic but completely ambulatory  There were no vitals filed for this visit. There were no vitals filed for this visit.  LABORATORY DATA:  I have reviewed the data as listed CMP Latest Ref Rng & Units 02/22/2020 01/10/2020 12/20/2019  Glucose 70 - 99 mg/dL 80 80 81  BUN 8 - 23 mg/dL 17 14 15   Creatinine 0.44 - 1.00 mg/dL 0.79 0.78 0.76  Sodium 135 - 145 mmol/L 140 139 141  Potassium 3.5 - 5.1 mmol/L 4.2 4.2 4.2  Chloride 98 - 111 mmol/L 107 107 109  CO2 22 - 32 mmol/L 25 26 23   Calcium 8.9 - 10.3 mg/dL 9.5 9.2 9.2  Total Protein 6.5 - 8.1 g/dL 6.4(L) 6.5 6.3(L)  Total Bilirubin 0.3 - 1.2 mg/dL 0.6 0.5 0.5  Alkaline Phos 38 - 126 U/L 70 70 66  AST 15 -  41 U/L 27 21 23   ALT 0 - 44 U/L 33 20 24    Lab Results  Component Value Date   WBC 5.8 04/03/2020   HGB 12.9 04/03/2020   HCT 39.4 04/03/2020   MCV 91.0 04/03/2020   PLT 189 04/03/2020   NEUTROABS 3.9 04/03/2020    ASSESSMENT & PLAN:  Malignant neoplasm of 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, estrogen receptor positive ([place]) 09/07/2019:Right lumpectomy ([name]): IDC with calcifications, grade 2, 1.5cm, clear margins, 2 lymph nodes negative.ER 100%, PR 70%, HER-2  positive, Ki-67 15% T1c N0 stage Ia  Treatment plan: 1.Adjuvant chemotherapy with Taxol [name] weekly x12 completed 12/20/2019 followed by [name] maintenance for 1 year 2.Adjuvant radiation completed 01/2024 3.Follow-up adjuvant antiestrogen therapy ---------------------------------------------------------------------------------------------------------------------------------------------------- Current treatment:[name] maintenance Echocardiogram 08/24/2019: EF 60 to 65% Echocardiogram on 11/22/2019:EF 60-65% Echocardiogram 02/25/2020:  EF 60 to 65%  [name] toxicities:None Mild intermittent peripheral neuropathy: Monitoring  Anastrozole to start in mid June, 2021. Anastrozole toxicities:   Enrolled in my chart care companion AET monitoring study.  Return to clinic every 3 weeks for [name] every 6 weeks to follow-up with me with labs.    No orders of the defined types were placed in this encounter.  The patient has a good understanding of the overall plan. she agrees with it. she will call with any problems that may develop before the next visit here.  Total time spent: 30 mins including face to face time and time spent for planning, charting and coordination of care  Nicholas Lose, MD 04/03/2020  I, Cloyde Reams Dorshimer, am acting as scribe for Dr. Nicholas Lose.  I have reviewed the above documentation for accuracy and completeness, and I agree with the above.

## 2020-04-03 ENCOUNTER — Inpatient Hospital Stay (HOSPITAL_BASED_OUTPATIENT_CLINIC_OR_DEPARTMENT_OTHER): Payer: Medicare Other | Admitting: Hematology and Oncology

## 2020-04-03 ENCOUNTER — Inpatient Hospital Stay: Payer: Medicare Other | Attending: Hematology and Oncology

## 2020-04-03 ENCOUNTER — Inpatient Hospital Stay: Payer: Medicare Other

## 2020-04-03 ENCOUNTER — Other Ambulatory Visit: Payer: Self-pay

## 2020-04-03 DIAGNOSIS — Z170 Estrogen receptor positive status [ER+]: Secondary | ICD-10-CM

## 2020-04-03 DIAGNOSIS — C50211 Malignant neoplasm of upper-inner quadrant of right female breast: Secondary | ICD-10-CM

## 2020-04-03 DIAGNOSIS — G629 Polyneuropathy, unspecified: Secondary | ICD-10-CM | POA: Diagnosis not present

## 2020-04-03 DIAGNOSIS — Z5112 Encounter for antineoplastic immunotherapy: Secondary | ICD-10-CM | POA: Diagnosis present

## 2020-04-03 DIAGNOSIS — Z95828 Presence of other vascular implants and grafts: Secondary | ICD-10-CM

## 2020-04-03 LAB — CBC WITH DIFFERENTIAL (CANCER CENTER ONLY)
Abs Immature Granulocytes: 0.01 10*3/uL (ref 0.00–0.07)
Basophils Absolute: 0 10*3/uL (ref 0.0–0.1)
Basophils Relative: 1 %
Eosinophils Absolute: 0.1 10*3/uL (ref 0.0–0.5)
Eosinophils Relative: 1 %
HCT: 39.4 % (ref 36.0–46.0)
Hemoglobin: 12.9 g/dL (ref 12.0–15.0)
Immature Granulocytes: 0 %
Lymphocytes Relative: 22 %
Lymphs Abs: 1.3 10*3/uL (ref 0.7–4.0)
MCH: 29.8 pg (ref 26.0–34.0)
MCHC: 32.7 g/dL (ref 30.0–36.0)
MCV: 91 fL (ref 80.0–100.0)
Monocytes Absolute: 0.5 10*3/uL (ref 0.1–1.0)
Monocytes Relative: 8 %
Neutro Abs: 3.9 10*3/uL (ref 1.7–7.7)
Neutrophils Relative %: 68 %
Platelet Count: 189 10*3/uL (ref 150–400)
RBC: 4.33 MIL/uL (ref 3.87–5.11)
RDW: 13.5 % (ref 11.5–15.5)
WBC Count: 5.8 10*3/uL (ref 4.0–10.5)
nRBC: 0 % (ref 0.0–0.2)

## 2020-04-03 LAB — CMP (CANCER CENTER ONLY)
ALT: 21 U/L (ref 0–44)
AST: 24 U/L (ref 15–41)
Albumin: 3.5 g/dL (ref 3.5–5.0)
Alkaline Phosphatase: 74 U/L (ref 38–126)
Anion gap: 7 (ref 5–15)
BUN: 20 mg/dL (ref 8–23)
CO2: 25 mmol/L (ref 22–32)
Calcium: 9.3 mg/dL (ref 8.9–10.3)
Chloride: 109 mmol/L (ref 98–111)
Creatinine: 0.82 mg/dL (ref 0.44–1.00)
GFR, Est AFR Am: >60 mL/min (ref >60)
GFR, Estimated: >60 mL/min (ref >60)
Glucose, Bld: 116 mg/dL — ABNORMAL HIGH (ref 70–99)
Potassium: 4.1 mmol/L (ref 3.5–5.1)
Sodium: 141 mmol/L (ref 135–145)
Total Bilirubin: 0.3 mg/dL (ref 0.3–1.2)
Total Protein: 6.3 g/dL — ABNORMAL LOW (ref 6.5–8.1)

## 2020-04-03 MED ORDER — SODIUM CHLORIDE 0.9% FLUSH
10.0000 mL | INTRAVENOUS | Status: DC | PRN
  Administered 2020-04-03: 10 mL
  Filled 2020-04-03: qty 10

## 2020-04-03 MED ORDER — SODIUM CHLORIDE 0.9 % IV SOLN
Freq: Once | INTRAVENOUS | Status: AC
  Filled 2020-04-03: qty 250

## 2020-04-03 MED ORDER — ACETAMINOPHEN 325 MG PO TABS
650.0000 mg | ORAL_TABLET | Freq: Once | ORAL | Status: AC
  Administered 2020-04-03: 650 mg via ORAL

## 2020-04-03 MED ORDER — ACETAMINOPHEN 325 MG PO TABS
ORAL_TABLET | ORAL | Status: AC
  Filled 2020-04-03: qty 2

## 2020-04-03 MED ORDER — DIPHENHYDRAMINE HCL 25 MG PO CAPS
50.0000 mg | ORAL_CAPSULE | Freq: Once | ORAL | Status: AC
  Administered 2020-04-03: 50 mg via ORAL

## 2020-04-03 MED ORDER — TRASTUZUMAB-DKST CHEMO 150 MG IV SOLR
6.0000 mg/kg | Freq: Once | INTRAVENOUS | Status: AC
  Administered 2020-04-03: 525 mg via INTRAVENOUS
  Filled 2020-04-03: qty 25

## 2020-04-03 MED ORDER — DIPHENHYDRAMINE HCL 25 MG PO CAPS
ORAL_CAPSULE | ORAL | Status: AC
  Filled 2020-04-03: qty 2

## 2020-04-03 MED ORDER — HEPARIN SOD (PORK) LOCK FLUSH 100 UNIT/ML IV SOLN
500.0000 [IU] | Freq: Once | INTRAVENOUS | Status: AC | PRN
  Administered 2020-04-03: 500 [IU]
  Filled 2020-04-03: qty 5

## 2020-04-03 NOTE — Assessment & Plan Note (Signed)
09/07/2019:Right lumpectomy [facility]): IDC with calcifications, grade 2, 1.5cm, clear margins, 2 lymph nodes negative.ER 100%, PR 70%, HER-2 positive, Ki-67 15% T1c N0 stage Ia  Treatment plan: 1.Adjuvant chemotherapy with Taxol [name] weekly x12 completed 12/20/2019 followed by [name] maintenance for 1 year 2.Adjuvant radiation completed 01/2024 3.Follow-up adjuvant antiestrogen therapy ---------------------------------------------------------------------------------------------------------------------------------------------------- Current treatment:[name] maintenance Echocardiogram 08/24/2019: EF 60 to 65% Echocardiogram on 11/22/2019:EF 60-65% Echocardiogram 02/25/2020:   [name] toxicities:None Mild intermittent peripheral neuropathy: Monitoring  Anastrozole to start in mid June, 2021. Anastrozole toxicities:   Enrolled in my chart care companion AET monitoring study.  Return to clinic every 3 weeks for [name] every 6 weeks to follow-up with me with labs.

## 2020-04-03 NOTE — Patient Instructions (Signed)
[facility] Discharge Instructions for Patients Receiving Chemotherapy  Today you received the following chemotherapy agents: trastuzumab.  To help prevent nausea and vomiting after your treatment, we encourage you to take your nausea medication {s directed.   If you develop nausea and vomiting that is not controlled by your nausea medication, call the clinic.   BELOW ARE SYMPTOMS THAT SHOULD BE REPORTED IMMEDIATELY:  *FEVER GREATER THAN 100.5 F  *CHILLS WITH OR WITHOUT FEVER  NAUSEA AND VOMITING THAT IS NOT CONTROLLED WITH YOUR NAUSEA MEDICATION  *UNUSUAL SHORTNESS OF BREATH  *UNUSUAL BRUISING OR BLEEDING  TENDERNESS IN MOUTH AND THROAT WITH OR WITHOUT PRESENCE OF ULCERS  *URINARY PROBLEMS  *BOWEL PROBLEMS  UNUSUAL RASH Items with * indicate a potential emergency and should be followed up as soon as possible.  Feel free to call the clinic should you have any questions or concerns. The clinic phone number is [phone].  Please show the CHEMO ALERT CARD at check-in to the Emergency Department and triage nurse.   

## 2020-04-04 ENCOUNTER — Telehealth: Payer: Self-pay | Admitting: Hematology and Oncology

## 2020-04-04 NOTE — Telephone Encounter (Signed)
Scheduled per 7/15 los. Called and spoke with pt, confirmed added appts

## 2020-04-08 ENCOUNTER — Encounter: Payer: Self-pay | Admitting: *Deleted

## 2020-04-14 ENCOUNTER — Encounter (INDEPENDENT_AMBULATORY_CARE_PROVIDER_SITE_OTHER): Payer: Self-pay

## 2020-04-24 ENCOUNTER — Inpatient Hospital Stay (HOSPITAL_BASED_OUTPATIENT_CLINIC_OR_DEPARTMENT_OTHER): Payer: Medicare Other | Admitting: Medical

## 2020-04-24 ENCOUNTER — Other Ambulatory Visit: Payer: Self-pay

## 2020-04-24 ENCOUNTER — Other Ambulatory Visit: Payer: Self-pay | Admitting: Medical

## 2020-04-24 ENCOUNTER — Inpatient Hospital Stay: Payer: Medicare Other | Attending: Hematology and Oncology

## 2020-04-24 VITALS — BP 125/61 | HR 74 | Temp 98.4°F | Resp 17 | Wt 192.5 lb

## 2020-04-24 DIAGNOSIS — Z79811 Long term (current) use of aromatase inhibitors: Secondary | ICD-10-CM | POA: Diagnosis not present

## 2020-04-24 DIAGNOSIS — Z170 Estrogen receptor positive status [ER+]: Secondary | ICD-10-CM | POA: Diagnosis not present

## 2020-04-24 DIAGNOSIS — G620 Drug-induced polyneuropathy: Secondary | ICD-10-CM | POA: Insufficient documentation

## 2020-04-24 DIAGNOSIS — C50211 Malignant neoplasm of upper-inner quadrant of right female breast: Secondary | ICD-10-CM | POA: Diagnosis present

## 2020-04-24 DIAGNOSIS — Z5112 Encounter for antineoplastic immunotherapy: Secondary | ICD-10-CM | POA: Diagnosis present

## 2020-04-24 DIAGNOSIS — L237 Allergic contact dermatitis due to plants, except food: Secondary | ICD-10-CM

## 2020-04-24 MED ORDER — HEPARIN SOD (PORK) LOCK FLUSH 100 UNIT/ML IV SOLN
500.0000 [IU] | Freq: Once | INTRAVENOUS | Status: AC | PRN
  Administered 2020-04-24: 500 [IU]
  Filled 2020-04-24: qty 5

## 2020-04-24 MED ORDER — SODIUM CHLORIDE 0.9% FLUSH
10.0000 mL | INTRAVENOUS | Status: DC | PRN
  Administered 2020-04-24: 10 mL
  Filled 2020-04-24: qty 10

## 2020-04-24 MED ORDER — TRIAMCINOLONE ACETONIDE 0.025 % EX LOTN: 1.0000 "application " | TOPICAL_LOTION | Freq: Three times a day (TID) | CUTANEOUS | 1 refills | Status: DC

## 2020-04-24 MED ORDER — DIPHENHYDRAMINE HCL 25 MG PO CAPS
ORAL_CAPSULE | ORAL | Status: AC
  Filled 2020-04-24: qty 2

## 2020-04-24 MED ORDER — DIPHENHYDRAMINE HCL 25 MG PO CAPS
50.0000 mg | ORAL_CAPSULE | Freq: Once | ORAL | Status: AC
  Administered 2020-04-24: 50 mg via ORAL

## 2020-04-24 MED ORDER — SODIUM CHLORIDE 0.9 % IV SOLN
Freq: Once | INTRAVENOUS | Status: AC
  Filled 2020-04-24: qty 250

## 2020-04-24 MED ORDER — ACETAMINOPHEN 325 MG PO TABS
ORAL_TABLET | ORAL | Status: AC
  Filled 2020-04-24: qty 2

## 2020-04-24 MED ORDER — TRASTUZUMAB-DKST CHEMO 150 MG IV SOLR
6.0000 mg/kg | Freq: Once | INTRAVENOUS | Status: AC
  Administered 2020-04-24: 525 mg via INTRAVENOUS
  Filled 2020-04-24: qty 25

## 2020-04-24 MED ORDER — ACETAMINOPHEN 325 MG PO TABS
650.0000 mg | ORAL_TABLET | Freq: Once | ORAL | Status: AC
  Administered 2020-04-24: 650 mg via ORAL

## 2020-04-24 NOTE — Progress Notes (Signed)
Amy Harrington was seen in the infusion room today.  She has developed a rash on her right anterior forearm which she suspects is poison ivy.  She has been using calamine lotion to the area.  She was given a prescription for triamcinolone lotion.  Sandi Mealy, MHS, PA-C Physician Assistant

## 2020-04-24 NOTE — Patient Instructions (Signed)
[facility] Discharge Instructions for Patients Receiving Chemotherapy  Today you received the following chemotherapy agents: trastuzumab.  To help prevent nausea and vomiting after your treatment, we encourage you to take your nausea medication {s directed.   If you develop nausea and vomiting that is not controlled by your nausea medication, call the clinic.   BELOW ARE SYMPTOMS THAT SHOULD BE REPORTED IMMEDIATELY:  *FEVER GREATER THAN 100.5 F  *CHILLS WITH OR WITHOUT FEVER  NAUSEA AND VOMITING THAT IS NOT CONTROLLED WITH YOUR NAUSEA MEDICATION  *UNUSUAL SHORTNESS OF BREATH  *UNUSUAL BRUISING OR BLEEDING  TENDERNESS IN MOUTH AND THROAT WITH OR WITHOUT PRESENCE OF ULCERS  *URINARY PROBLEMS  *BOWEL PROBLEMS  UNUSUAL RASH Items with * indicate a potential emergency and should be followed up as soon as possible.  Feel free to call the clinic should you have any questions or concerns. The clinic phone number is [phone].  Please show the CHEMO ALERT CARD at check-in to the Emergency Department and triage nurse.   

## 2020-04-28 ENCOUNTER — Encounter (INDEPENDENT_AMBULATORY_CARE_PROVIDER_SITE_OTHER): Payer: Self-pay

## 2020-05-14 NOTE — Progress Notes (Signed)
Patient Care Team: Carol Ada, MD as PCP - General (Family Medicine) Rockwell Germany, RN as Oncology Nurse Navigator Tressie Ellis, Paulette Blanch, RN as Oncology Nurse Navigator Nicholas Lose, MD as Consulting Physician (Hematology and Oncology) Kyung Rudd, MD as Consulting Physician (Radiation Oncology) Stark Klein, MD as Consulting Physician (General Surgery) Kyung Rudd, MD as Consulting Physician (Radiation Oncology)  DIAGNOSIS:    ICD-10-CM   1. Malignant neoplasm of 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, estrogen receptor positive ([place])  C50.211    Z17.0     SUMMARY OF ONCOLOGIC HISTORY: Oncology History  Malignant neoplasm of 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, estrogen receptor positive ([place])  08/17/2019 Initial Diagnosis   Routine screening mammogram detected a 0.8cm indeterminate mass in the right breast. Biopsy showed invasive ductal carcinoma, grade 2, HER-2 + by FISH, ER+ 100%, PR+ 70%, Ki67 15%.    08/22/2019 Cancer Staging   Staging form: Breast, AJCC 8th Edition - Clinical stage from 08/22/2019: Stage IA (cT1b, cN0, cM0, G2, ER+, PR+, HER2+) - Signed by Nicholas Lose, MD on 08/22/2019   09/07/2019 Surgery   Right lumpectomy [facility]): IDC with calcifications, grade 2, 1.5cm, clear margins, 2 lymph nodes negative.    10/04/2019 -  Adjuvant Chemotherapy   Weekly Taxol and [name] x 12 completing on 12/20/2019, then maintenance [name] through the rest of 2021.   01/17/2020 - 02/12/2020 Radiation Therapy   Adjuvant radiation therapy   03/04/2020 -  Anti-estrogen oral therapy   Anastrozole     CHIEF COMPLIANT: [name] maintenance  INTERVAL HISTORY: Amy Harrington is a 69 y.o. with above-mentioned history of right breast cancer who underwent a lumpectomy, adjuvant chemotherapy, radiation, and is currently on [name] maintenance and antiestrogen therapy with anastrozole. She presents to the clinic todayfor treatment.  ALLERGIES:  has No Known  Allergies.  MEDICATIONS:  Current Outpatient Medications  Medication Sig Dispense Refill  . anastrozole (ARIMIDEX) 1 MG tablet Take 1 tablet (1 mg total) by mouth daily. 30 tablet 6  . esomeprazole (NEXIUM) 20 MG capsule Take 20 mg by mouth daily at 12 noon.    . lidocaine-prilocaine (EMLA) cream Apply to affected area once 30 g 3  . silver sulfADIAZINE (SILVADENE) 1 % cream Apply 1 application topically 2 (two) times daily. 50 g 1  . Triamcinolone Acetonide 0.025 % LOTN Apply 1 application topically in the morning, at noon, and at bedtime. 60 mL 1   No current facility-administered medications for this visit.   Facility-Administered Medications Ordered in Other Visits  Medication Dose Route Frequency Provider Last Rate Last Admin  . sodium chloride flush (NS) 0.9 % injection 10 mL  10 mL Intracatheter PRN Nicholas Lose, MD   10 mL at 05/15/20 0829    PHYSICAL EXAMINATION: ECOG PERFORMANCE STATUS: 1 - Symptomatic but completely ambulatory  There were no vitals filed for this visit. There were no vitals filed for this visit.  LABORATORY DATA:  I have reviewed the data as listed CMP Latest Ref Rng & Units 04/03/2020 02/22/2020 01/10/2020  Glucose 70 - 99 mg/dL 116(H) 80 80  BUN 8 - 23 mg/dL _0 Creatinine 0.44 - 1.00 mg/dL 0.82 0.79 0.78  Sodium 135 - 145 mmol/L 141 140 139  Potassium 3.5 - 5.1 mmol/L 4.1 4.2 4.2  Chloride 98 - 111 mmol/L 109 107 107  CO2 22 - 32 mmol/L _1 Calcium 8.9 - 10.3 mg/dL 9.3 9.5 9.2  Total Protein 6.5 - 8.1  g/dL 6.3(L) 6.4(L) 6.5  Total Bilirubin 0.3 - 1.2 mg/dL 0.3 0.6 0.5  Alkaline Phos 38 - 126 U/L 74 70 70  AST 15 - 41 U/L _0 ALT 0 - 44 U/L 21 33 20    Lab Results  Component Value Date   WBC 5.8 04/03/2020   HGB 12.9 04/03/2020   HCT 39.4 04/03/2020   MCV 91.0 04/03/2020   PLT 189 04/03/2020   NEUTROABS 3.9 04/03/2020    ASSESSMENT & PLAN:  Malignant neoplasm of 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, estrogen  receptor positive ([place]) 09/07/2019:Right lumpectomy ([name]): IDC with calcifications, grade 2, 1.5cm, clear margins, 2 lymph nodes negative.ER 100%, PR 70%, HER-2 positive, Ki-67 15% T1c N0 stage Ia  Treatment plan: 1.Adjuvant chemotherapy with Taxol [name] weekly x12 completed 12/20/2019 followed by [name] maintenance for 1 year 2.Adjuvant radiationcompleted 01/2024 3.Follow-up adjuvant antiestrogen therapy ---------------------------------------------------------------------------------------------------------------------------------------------------- Current treatment:[name] maintenance to be completed December 2021. Echocardiogram 02/25/2020: EF 60 to 65%  [name] toxicities:None Mild intermittent peripheral neuropathy: Monitoring  Anastrozole to start in mid June, 2021. Anastrozole toxicities:Denies any worsening hot flashes or arthralgias or myalgias.  Enrolled in my chart care companion AET monitoring study.  Return to clinic every 3 weeks for [name] every 6 weeks to follow-up with me with labs.     No orders of the defined types were placed in this encounter.  The patient has a good understanding of the overall plan. she agrees with it. she will call with any problems that may develop before the next visit here.  Total time spent: 30 mins including face to face time and time spent for planning, charting and coordination of care  Nicholas Lose, MD 05/15/2020  I, Cloyde Reams Dorshimer, am acting as scribe for Dr. Nicholas Lose.  I have reviewed the above documentation for accuracy and completeness, and I agree with the above.

## 2020-05-15 ENCOUNTER — Encounter: Payer: Self-pay | Admitting: *Deleted

## 2020-05-15 ENCOUNTER — Other Ambulatory Visit: Payer: Self-pay

## 2020-05-15 ENCOUNTER — Inpatient Hospital Stay: Payer: Medicare Other

## 2020-05-15 ENCOUNTER — Inpatient Hospital Stay (HOSPITAL_BASED_OUTPATIENT_CLINIC_OR_DEPARTMENT_OTHER): Payer: Medicare Other | Admitting: Hematology and Oncology

## 2020-05-15 DIAGNOSIS — Z170 Estrogen receptor positive status [ER+]: Secondary | ICD-10-CM

## 2020-05-15 DIAGNOSIS — C50211 Malignant neoplasm of upper-inner quadrant of right female breast: Secondary | ICD-10-CM | POA: Diagnosis not present

## 2020-05-15 DIAGNOSIS — Z5112 Encounter for antineoplastic immunotherapy: Secondary | ICD-10-CM | POA: Diagnosis not present

## 2020-05-15 DIAGNOSIS — Z95828 Presence of other vascular implants and grafts: Secondary | ICD-10-CM

## 2020-05-15 LAB — CMP (CANCER CENTER ONLY)
ALT: 19 U/L (ref 0–44)
AST: 24 U/L (ref 15–41)
Albumin: 3.8 g/dL (ref 3.5–5.0)
Alkaline Phosphatase: 66 U/L (ref 38–126)
Anion gap: 6 (ref 5–15)
BUN: 14 mg/dL (ref 8–23)
CO2: 27 mmol/L (ref 22–32)
Calcium: 9.9 mg/dL (ref 8.9–10.3)
Chloride: 108 mmol/L (ref 98–111)
Creatinine: 0.84 mg/dL (ref 0.44–1.00)
GFR, Est AFR Am: >60 mL/min (ref >60)
GFR, Estimated: >60 mL/min (ref >60)
Glucose, Bld: 97 mg/dL (ref 70–99)
Potassium: 4.2 mmol/L (ref 3.5–5.1)
Sodium: 141 mmol/L (ref 135–145)
Total Bilirubin: 0.6 mg/dL (ref 0.3–1.2)
Total Protein: 6.4 g/dL — ABNORMAL LOW (ref 6.5–8.1)

## 2020-05-15 LAB — CBC WITH DIFFERENTIAL (CANCER CENTER ONLY)
Abs Immature Granulocytes: 0.03 10*3/uL (ref 0.00–0.07)
Basophils Absolute: 0 10*3/uL (ref 0.0–0.1)
Basophils Relative: 0 %
Eosinophils Absolute: 0.1 10*3/uL (ref 0.0–0.5)
Eosinophils Relative: 2 %
HCT: 40.6 % (ref 36.0–46.0)
Hemoglobin: 13.2 g/dL (ref 12.0–15.0)
Immature Granulocytes: 1 %
Lymphocytes Relative: 19 %
Lymphs Abs: 1 10*3/uL (ref 0.7–4.0)
MCH: 29.2 pg (ref 26.0–34.0)
MCHC: 32.5 g/dL (ref 30.0–36.0)
MCV: 89.8 fL (ref 80.0–100.0)
Monocytes Absolute: 0.4 10*3/uL (ref 0.1–1.0)
Monocytes Relative: 8 %
Neutro Abs: 3.8 10*3/uL (ref 1.7–7.7)
Neutrophils Relative %: 70 %
Platelet Count: 189 10*3/uL (ref 150–400)
RBC: 4.52 MIL/uL (ref 3.87–5.11)
RDW: 13.8 % (ref 11.5–15.5)
WBC Count: 5.3 10*3/uL (ref 4.0–10.5)
nRBC: 0 % (ref 0.0–0.2)

## 2020-05-15 MED ORDER — ACETAMINOPHEN 325 MG PO TABS
ORAL_TABLET | ORAL | Status: AC
  Filled 2020-05-15: qty 2

## 2020-05-15 MED ORDER — DIPHENHYDRAMINE HCL 25 MG PO CAPS
ORAL_CAPSULE | ORAL | Status: AC
  Filled 2020-05-15: qty 1

## 2020-05-15 MED ORDER — ACETAMINOPHEN 325 MG PO TABS
650.0000 mg | ORAL_TABLET | Freq: Once | ORAL | Status: AC
  Administered 2020-05-15: 650 mg via ORAL

## 2020-05-15 MED ORDER — SODIUM CHLORIDE 0.9% FLUSH
10.0000 mL | INTRAVENOUS | Status: DC | PRN
  Administered 2020-05-15: 10 mL
  Filled 2020-05-15: qty 10

## 2020-05-15 MED ORDER — DIPHENHYDRAMINE HCL 25 MG PO CAPS
50.0000 mg | ORAL_CAPSULE | Freq: Once | ORAL | Status: AC
  Administered 2020-05-15: 50 mg via ORAL

## 2020-05-15 MED ORDER — SODIUM CHLORIDE 0.9 % IV SOLN
Freq: Once | INTRAVENOUS | Status: AC
  Filled 2020-05-15: qty 250

## 2020-05-15 MED ORDER — HEPARIN SOD (PORK) LOCK FLUSH 100 UNIT/ML IV SOLN
500.0000 [IU] | Freq: Once | INTRAVENOUS | Status: AC | PRN
  Administered 2020-05-15: 500 [IU]
  Filled 2020-05-15: qty 5

## 2020-05-15 MED ORDER — DIPHENHYDRAMINE HCL 25 MG PO CAPS
ORAL_CAPSULE | ORAL | Status: AC
  Filled 2020-05-15: qty 2

## 2020-05-15 MED ORDER — TRASTUZUMAB-DKST CHEMO 150 MG IV SOLR
6.0000 mg/kg | Freq: Once | INTRAVENOUS | Status: AC
  Administered 2020-05-15: 525 mg via INTRAVENOUS
  Filled 2020-05-15: qty 25

## 2020-05-15 NOTE — Assessment & Plan Note (Signed)
09/07/2019:Right lumpectomy [facility]): IDC with calcifications, grade 2, 1.5cm, clear margins, 2 lymph nodes negative.ER 100%, PR 70%, HER-2 positive, Ki-67 15% T1c N0 stage Ia  Treatment plan: 1.Adjuvant chemotherapy with Taxol [name] weekly x12 completed 12/20/2019 followed by [name] maintenance for 1 year 2.Adjuvant radiationcompleted 01/2024 3.Follow-up adjuvant antiestrogen therapy ---------------------------------------------------------------------------------------------------------------------------------------------------- Current treatment:[name] maintenance to be completed January 2022 Echocardiogram 02/25/2020: EF 60 to 65%  [name] toxicities:None Mild intermittent peripheral neuropathy: Monitoring  Anastrozole to start in mid June, 2021. Anastrozole toxicities:  Enrolled in my chart care companion AET monitoring study.  Return to clinic every 3 weeks for [name] every 6 weeks to follow-up with me with labs.

## 2020-05-15 NOTE — Patient Instructions (Signed)
[facility] Discharge Instructions for Patients Receiving Chemotherapy  Today you received the following chemotherapy agents: trastuzumab.  To help prevent nausea and vomiting after your treatment, we encourage you to take your nausea medication {s directed.   If you develop nausea and vomiting that is not controlled by your nausea medication, call the clinic.   BELOW ARE SYMPTOMS THAT SHOULD BE REPORTED IMMEDIATELY:  *FEVER GREATER THAN 100.5 F  *CHILLS WITH OR WITHOUT FEVER  NAUSEA AND VOMITING THAT IS NOT CONTROLLED WITH YOUR NAUSEA MEDICATION  *UNUSUAL SHORTNESS OF BREATH  *UNUSUAL BRUISING OR BLEEDING  TENDERNESS IN MOUTH AND THROAT WITH OR WITHOUT PRESENCE OF ULCERS  *URINARY PROBLEMS  *BOWEL PROBLEMS  UNUSUAL RASH Items with * indicate a potential emergency and should be followed up as soon as possible.  Feel free to call the clinic should you have any questions or concerns. The clinic phone number is [phone].  Please show the [place] at check-in to the Emergency Department and triage nurse.

## 2020-05-16 ENCOUNTER — Telehealth: Payer: Self-pay | Admitting: Hematology and Oncology

## 2020-05-16 NOTE — Telephone Encounter (Signed)
Scheduled per 8/26 los. Pt will be given an updated appt calendar at next visit per appt notes

## 2020-05-21 ENCOUNTER — Encounter (INDEPENDENT_AMBULATORY_CARE_PROVIDER_SITE_OTHER): Payer: Self-pay

## 2020-05-26 NOTE — Progress Notes (Signed)
 CARDIO-ONCOLOGY CLINIC NOTE    HPI: Amy Harrington is a 68 y/o woman with h/o GERD and R breast cancer referred by Dr. Gudena for enrollment into the cardio-oncology clinic for cardiac surveillance while receiving chemotherapy.   SUMMARY OF ONCOLOGIC HISTORY:    Oncology History  Malignant neoplasm of 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, estrogen receptor positive ([facility])  08/17/2019 Initial Diagnosis   Routine screening mammogram detected a 0.8cm indeterminate mass in the right breast. Biopsy showed invasive ductal carcinoma, grade 2, HER-2 + by FISH, ER+ 100%, PR+ 70%, Ki67 15%.    08/22/2019 Cancer Staging   Staging form: Breast, AJCC 8th Edition - Clinical stage from 08/22/2019: Stage IA (cT1b, cN0, cM0, G2, ER+, PR+, HER2+) - Signed by Gudena, Vinay, MD on 08/22/2019   09/07/2019 Surgery   Right lumpectomy ([name]): IDC with calcifications, grade 2, 1.5cm, clear margins, 2 lymph nodes negative.    10/04/2019 -  Chemotherapy   The patient had PACLitaxel (TAXOL) 156 mg in sodium chloride 0.9 % 250 mL chemo infusion (</= 80mg/m2), 80 mg/m2 = 156 mg, Intravenous,  Once, 2 of 3 cycles Dose modification: 70 mg/m2 (original dose 80 mg/m2, Cycle 2, Reason: Dose not tolerated) Administration: 156 mg (10/04/2019), 156 mg (10/10/2019), 138 mg (11/01/2019), 156 mg (10/17/2019), 138 mg (10/25/2019), 138 mg (11/09/2019), 138 mg (11/15/2019) trastuzumab-dkst (OGIVRI) 357 mg in sodium chloride 0.9 % 250 mL chemo infusion, 4 mg/kg = 357 mg, Intravenous,  Once, 2 of 16 cycles Administration: 357 mg (10/04/2019), 168 mg (10/10/2019), 168 mg (11/01/2019), 168 mg (10/17/2019), 168 mg (10/25/2019), 168 mg (11/09/2019), 168 mg (11/15/2019)  for chemotherapy treatment.      Denies any h/o of cardiac disease. 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 Finishe taxol/[name] and XRT in 5/21. Had lumpectomy in 12/20.  Getting herceptin every 3 weeks. No issues. Remains very  bus at work. No SOB or LE edema.   Echo today 05/27/20 EF 60-65% GLS -21.6% Personally reviewed   Echo 6/21 EF 60-65% strain underestimated due to poor tracking. ECHO 08/24/19 EF 60-65% Grade I DD    Echo 11/22/19: EF 60-65%    Past Medical History:  Diagnosis Date  . GERD (gastroesophageal reflux disease)   . Plantar fasciitis   . PONV (postoperative nausea and vomiting)     Current Outpatient Medications  Medication Sig Dispense Refill  . anastrozole (ARIMIDEX) 1 MG tablet Take 1 tablet (1 mg total) by mouth daily. 30 tablet 6  . esomeprazole (NEXIUM) 20 MG capsule Take 20 mg by mouth daily at 12 noon.    . lidocaine-prilocaine (EMLA) cream Apply to affected area once 30 g 3   No current facility-administered medications for this encounter.    No Known Allergies    Social History   Socioeconomic History  . Marital status: Widowed    Spouse name: Not on file  . Number of children: Not on file  . Years of education: Not on file  . Highest education level: Not on file  Occupational History  . Not on file  Tobacco Use  . Smoking status: Never Smoker  . Smokeless tobacco: Never Used  Substance and Sexual Activity  . Alcohol use: Not Currently  . Drug use: Never  . Sexual activity: Not on file  Other Topics Concern  . Not on file  Social History Narrative  . Not on file   Social Determinants of Health   Financial Resource Strain:   .   Difficulty of Paying Living Expenses: Not on file  Food Insecurity:   . Worried About Charity fundraiser in the Last Year: Not on file  . Ran Out of Food in the Last Year: Not on file  Transportation Needs:   . Lack of Transportation (Medical): Not on file  . Lack of Transportation (Non-Medical): Not on file  Physical Activity:   . Days of Exercise per Week: Not on file  . Minutes of Exercise per Session: Not on file  Stress:   . Feeling of Stress : Not on file  Social Connections:   . Frequency of Communication with Friends  and Family: Not on file  . Frequency of Social Gatherings with Friends and Family: Not on file  . Attends Religious Services: Not on file  . Active Member of Clubs or Organizations: Not on file  . Attends Archivist Meetings: Not on file  . Marital Status: Not on file  Intimate Partner Violence:   . Fear of Current or Ex-Partner: Not on file  . Emotionally Abused: Not on file  . Physically Abused: Not on file  . Sexually Abused: Not on file      Family History  Problem Relation Age of Onset  . Stomach cancer Father 17  GM died at 64 during childbirth due to "heart problems"   Vitals:   05/27/20 0956  BP: 120/80  Pulse: 67  SpO2: 94%  Weight: 86.4 kg (190 lb 6.4 oz)    PHYSICAL EXAM: General:  Well appearing. No resp difficulty HEENT: normal Neck: supple. no JVD. Carotids 2+ bilat; no bruits. No lymphadenopathy or thryomegaly appreciated. Cor: PMI nondisplaced. Regular rate & rhythm. No rubs, gallops or murmurs. Lungs: clear Abdomen: soft, nontender, nondistended. No hepatosplenomegaly. No bruits or masses. Good bowel sounds. Extremities: no cyanosis, clubbing, rash, edema Neuro: alert & orientedx3, cranial nerves grossly intact. moves all 4 extremities w/o difficulty. Affect pleasant    ASSESSMENT & PLAN: 1. Right Breast Cancer - I reviewed echos personally. EF and Doppler parameters stable. No HF on exam. Continue [name].  - She will complete [name] in 12/21. Will need one more surveillance echo.   Glori Bickers, MD  10:34 AM

## 2020-05-27 ENCOUNTER — Ambulatory Visit (HOSPITAL_COMMUNITY)
Admission: RE | Admit: 2020-05-27 | Discharge: 2020-05-27 | Disposition: A | Discharge status: Home / Self Care | Payer: Medicare Other | Source: Ambulatory Visit | Attending: Internal Medicine | Admitting: Internal Medicine

## 2020-05-27 ENCOUNTER — Ambulatory Visit (HOSPITAL_BASED_OUTPATIENT_CLINIC_OR_DEPARTMENT_OTHER)
Admission: RE | Admit: 2020-05-27 | Discharge: 2020-05-27 | Disposition: A | Discharge status: Home / Self Care | Payer: Medicare Other | Source: Ambulatory Visit | Attending: Internal Medicine | Admitting: Internal Medicine

## 2020-05-27 ENCOUNTER — Other Ambulatory Visit: Payer: Self-pay

## 2020-05-27 VITALS — BP 120/80 | HR 67 | Wt 190.4 lb

## 2020-05-27 DIAGNOSIS — C50211 Malignant neoplasm of upper-inner quadrant of right female breast: Secondary | ICD-10-CM | POA: Insufficient documentation

## 2020-05-27 DIAGNOSIS — I509 Heart failure, unspecified: Secondary | ICD-10-CM | POA: Diagnosis not present

## 2020-05-27 DIAGNOSIS — Z170 Estrogen receptor positive status [ER+]: Secondary | ICD-10-CM | POA: Insufficient documentation

## 2020-05-27 DIAGNOSIS — Z0189 Encounter for other specified special examinations: Secondary | ICD-10-CM | POA: Diagnosis not present

## 2020-05-27 DIAGNOSIS — K219 Gastro-esophageal reflux disease without esophagitis: Secondary | ICD-10-CM | POA: Insufficient documentation

## 2020-05-27 DIAGNOSIS — Z79899 Other long term (current) drug therapy: Secondary | ICD-10-CM | POA: Diagnosis not present

## 2020-05-27 LAB — ECHOCARDIOGRAM COMPLETE
Area-P 1/2: 2.13 cm2
Calc EF: 64.2 %
S' Lateral: 2.9 cm
Single Plane A2C EF: 63.3 %
Single Plane A4C EF: 64.9 %

## 2020-05-27 NOTE — Progress Notes (Signed)
  Echocardiogram 2D Echocardiogram has been performed.  Amy Harrington 05/27/2020, 9:51 AM

## 2020-05-27 NOTE — Patient Instructions (Signed)
Your physician has requested that you have an echocardiogram. Echocardiography is a painless test that uses sound waves to create images of your heart. It provides your doctor with information about the size and shape of your heart and how well your heart's chambers and valves are working. This procedure takes approximately one hour. There are no restrictions for this procedure.   Please follow up with our office in 3 months for your appointment with an echocardiogram   If you have any questions or concerns before your next appointment please send [place] a message through [place] or call our office at [phone].    TO LEAVE A MESSAGE FOR THE NURSE SELECT OPTION 2, PLEASE LEAVE A MESSAGE INCLUDING: . YOUR NAME . DATE OF BIRTH . CALL BACK NUMBER . REASON FOR CALL**this is important as we prioritize the call backs  [place] AS LONG AS YOU CALL BEFORE 4:00 PM  At the [facility], you and your health needs are our priority. As part of our continuing mission to provide you with exceptional heart care, we have created designated Provider Care Teams. These Care Teams include your primary Cardiologist (physician) and Advanced Practice Providers (APPs- Physician Assistants and Nurse Practitioners) who all work together to provide you with the care you need, when you need it.   You may see any of the following providers on your designated Care Team at your next follow up: Marland Kitchen Dr Glori Bickers . Dr Loralie Champagne . Darrick Grinder, NP . Lyda Jester, PA . Audry Riles, PharmD   Please be sure to bring in all your medications bottles to every appointment.

## 2020-05-27 NOTE — Addendum Note (Signed)
Encounter addended by: Malena Edman, RN on: 05/27/2020 10:42 AM  Actions taken: Visit diagnoses modified, Order list changed, Diagnosis association updated, Clinical Note Signed

## 2020-06-05 ENCOUNTER — Other Ambulatory Visit: Payer: Self-pay

## 2020-06-05 ENCOUNTER — Inpatient Hospital Stay: Payer: Medicare Other | Attending: Hematology and Oncology

## 2020-06-05 ENCOUNTER — Encounter: Payer: Self-pay | Admitting: *Deleted

## 2020-06-05 VITALS — BP 133/62 | HR 68 | Temp 98.1°F | Resp 18 | Wt 190.8 lb

## 2020-06-05 DIAGNOSIS — Z5112 Encounter for antineoplastic immunotherapy: Secondary | ICD-10-CM | POA: Insufficient documentation

## 2020-06-05 DIAGNOSIS — Z170 Estrogen receptor positive status [ER+]: Secondary | ICD-10-CM

## 2020-06-05 DIAGNOSIS — C50211 Malignant neoplasm of upper-inner quadrant of right female breast: Secondary | ICD-10-CM | POA: Diagnosis present

## 2020-06-05 MED ORDER — SODIUM CHLORIDE 0.9% FLUSH
10.0000 mL | INTRAVENOUS | Status: DC | PRN
  Administered 2020-06-05: 10 mL
  Filled 2020-06-05: qty 10

## 2020-06-05 MED ORDER — DIPHENHYDRAMINE HCL 25 MG PO CAPS
50.0000 mg | ORAL_CAPSULE | Freq: Once | ORAL | Status: AC
  Administered 2020-06-05: 50 mg via ORAL

## 2020-06-05 MED ORDER — TRASTUZUMAB-DKST CHEMO 150 MG IV SOLR
6.0000 mg/kg | Freq: Once | INTRAVENOUS | Status: AC
  Administered 2020-06-05: 525 mg via INTRAVENOUS
  Filled 2020-06-05: qty 25

## 2020-06-05 MED ORDER — ACETAMINOPHEN 325 MG PO TABS
ORAL_TABLET | ORAL | Status: AC
  Filled 2020-06-05: qty 2

## 2020-06-05 MED ORDER — DIPHENHYDRAMINE HCL 25 MG PO CAPS
ORAL_CAPSULE | ORAL | Status: AC
  Filled 2020-06-05: qty 2

## 2020-06-05 MED ORDER — SODIUM CHLORIDE 0.9 % IV SOLN
Freq: Once | INTRAVENOUS | Status: AC
  Filled 2020-06-05: qty 250

## 2020-06-05 MED ORDER — ACETAMINOPHEN 325 MG PO TABS
650.0000 mg | ORAL_TABLET | Freq: Once | ORAL | Status: AC
  Administered 2020-06-05: 650 mg via ORAL

## 2020-06-05 MED ORDER — HEPARIN SOD (PORK) LOCK FLUSH 100 UNIT/ML IV SOLN
500.0000 [IU] | Freq: Once | INTRAVENOUS | Status: AC | PRN
  Administered 2020-06-05: 500 [IU]
  Filled 2020-06-05: qty 5

## 2020-06-05 NOTE — Patient Instructions (Signed)
[facility] Discharge Instructions for Patients Receiving Chemotherapy  Today you received the following chemotherapy agents trastuzumab.  To help prevent nausea and vomiting after your treatment, we encourage you to take your nausea medication as directed.    If you develop nausea and vomiting that is not controlled by your nausea medication, call the clinic.   BELOW ARE SYMPTOMS THAT SHOULD BE REPORTED IMMEDIATELY:  *FEVER GREATER THAN 100.5 F  *CHILLS WITH OR WITHOUT FEVER  NAUSEA AND VOMITING THAT IS NOT CONTROLLED WITH YOUR NAUSEA MEDICATION  *UNUSUAL SHORTNESS OF BREATH  *UNUSUAL BRUISING OR BLEEDING  TENDERNESS IN MOUTH AND THROAT WITH OR WITHOUT PRESENCE OF ULCERS  *URINARY PROBLEMS  *BOWEL PROBLEMS  UNUSUAL RASH Items with * indicate a potential emergency and should be followed up as soon as possible.  Feel free to call the clinic should you have any questions or concerns. The clinic phone number is [phone].  Please show the CHEMO ALERT CARD at check-in to the Emergency Department and triage nurse.   

## 2020-06-25 NOTE — Progress Notes (Signed)
Patient Care Team: Carol Ada, MD as PCP - General (Family Medicine) Bensimhon, Shaune Pascal, MD as PCP - Advanced Heart Failure (Cardiology) Rockwell Germany, RN as Oncology Nurse Navigator Mauro Kaufmann, RN as Oncology Nurse Navigator Nicholas Lose, MD as Consulting Physician (Hematology and Oncology) Kyung Rudd, MD as Consulting Physician (Radiation Oncology) Stark Klein, MD as Consulting Physician (General Surgery) Kyung Rudd, MD as Consulting Physician (Radiation Oncology)  DIAGNOSIS:    ICD-10-CM   1. Malignant neoplasm of 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, estrogen receptor positive ([place])  C50.211    Z17.0     SUMMARY OF ONCOLOGIC HISTORY: Oncology History  Malignant neoplasm of 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, estrogen receptor positive ([place])  08/17/2019 Initial Diagnosis   Routine screening mammogram detected a 0.8cm indeterminate mass in the right breast. Biopsy showed invasive ductal carcinoma, grade 2, HER-2 + by FISH, ER+ 100%, PR+ 70%, Ki67 15%.    08/22/2019 Cancer Staging   Staging form: Breast, AJCC 8th Edition - Clinical stage from 08/22/2019: Stage IA (cT1b, cN0, cM0, G2, ER+, PR+, HER2+) - Signed by Nicholas Lose, MD on 08/22/2019   09/07/2019 Surgery   Right lumpectomy [facility]): IDC with calcifications, grade 2, 1.5cm, clear margins, 2 lymph nodes negative.    10/04/2019 -  Adjuvant Chemotherapy   Weekly Taxol and [name] x 12 completing on 12/20/2019, then maintenance [name] through the rest of 2021.   01/17/2020 - 02/12/2020 Radiation Therapy   Adjuvant radiation therapy   03/04/2020 -  Anti-estrogen oral therapy   Anastrozole     CHIEF COMPLIANT: [name] maintenance  INTERVAL HISTORY: Amy Harrington is a 69 y.o. with above-mentioned history of right breast cancer who underwent a lumpectomy, adjuvant chemotherapy,radiation,and is currently on [name] maintenance and antiestrogen therapy with anastrozole.Echo on  05/27/20 showed an ejection fraction of 60-65%. She presents to the clinic todayfor treatment.   ALLERGIES:  has No Known Allergies.  MEDICATIONS:  Current Outpatient Medications  Medication Sig Dispense Refill  . anastrozole (ARIMIDEX) 1 MG tablet Take 1 tablet (1 mg total) by mouth daily. 30 tablet 6  . esomeprazole (NEXIUM) 20 MG capsule Take 20 mg by mouth daily at 12 noon.    . lidocaine-prilocaine (EMLA) cream Apply to affected area once 30 g 3   No current facility-administered medications for this visit.    PHYSICAL EXAMINATION: ECOG PERFORMANCE STATUS: 1 - Symptomatic but completely ambulatory  Vitals:   06/26/20 0941  BP: 134/64  Pulse: 70  Resp: 17  Temp: 97.7 F (36.5 C)  SpO2: 96%   Filed Weights   06/26/20 0941  Weight: 190 lb 4.8 oz (86.3 kg)    LABORATORY DATA:  I have reviewed the data as listed CMP Latest Ref Rng & Units 05/15/2020 04/03/2020 02/22/2020  Glucose 70 - 99 mg/dL 97 116(H) 80  BUN 8 - 23 mg/dL 14 20 17   Creatinine 0.44 - 1.00 mg/dL 0.84 0.82 0.79  Sodium 135 - 145 mmol/L 141 141 140  Potassium 3.5 - 5.1 mmol/L 4.2 4.1 4.2  Chloride 98 - 111 mmol/L 108 109 107  CO2 22 - 32 mmol/L 27 25 25   Calcium 8.9 - 10.3 mg/dL 9.9 9.3 9.5  Total Protein 6.5 - 8.1 g/dL 6.4(L) 6.3(L) 6.4(L)  Total Bilirubin 0.3 - 1.2 mg/dL 0.6 0.3 0.6  Alkaline Phos 38 - 126 U/L 66 74 70  AST 15 - 41 U/L 24 24 27   ALT 0 - 44 U/L 19 21 33  Lab Results  Component Value Date   WBC 4.6 06/26/2020   HGB 12.9 06/26/2020   HCT 39.3 06/26/2020   MCV 90.1 06/26/2020   PLT 178 06/26/2020   NEUTROABS 3.1 06/26/2020    ASSESSMENT & PLAN:  Malignant neoplasm of 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, estrogen receptor positive ([place]) 09/07/2019:Right lumpectomy ([name]): IDC with calcifications, grade 2, 1.5cm, clear margins, 2 lymph nodes negative.ER 100%, PR 70%, HER-2 positive, Ki-67 15% T1c N0 stage Ia  Treatment plan: 1.Adjuvant chemotherapy with Taxol  [name] weekly x12 completed 12/20/2019 followed by [name] maintenance for 1 year 2.Adjuvant radiationcompleted 01/2024 3.Follow-up adjuvant antiestrogen therapy ---------------------------------------------------------------------------------------------------------------------------------------------------- Current treatment:[name] maintenance to be completed December 2021. Echocardiogram 02/25/2020:EF 60 to 65%  [name] toxicities:None Mild intermittent peripheral neuropathy: Monitoring  Anastrozole to start in mid June, 2021. Anastrozole toxicities:Denies any worsening hot flashes or arthralgias or myalgias.  Enrolled in my chart care companion AET monitoring study. She stays very busy running and [employer]. Her daughter has a dog that is going through cancer chemotherapy for melanoma.  Return to clinic every 3 weeks for [name] every 6 weeks to follow-up with me with labs.    No orders of the defined types were placed in this encounter.  The patient has a good understanding of the overall plan. she agrees with it. she will call with any problems that may develop before the next visit here.  Total time spent: 30 mins including face to face time and time spent for planning, charting and coordination of care  Nicholas Lose, MD 06/26/2020  I, Cloyde Reams Dorshimer, am acting as scribe for Dr. Nicholas Lose.  I have reviewed the above documentation for accuracy and completeness, and I agree with the above.

## 2020-06-26 ENCOUNTER — Inpatient Hospital Stay: Payer: Medicare Other

## 2020-06-26 ENCOUNTER — Inpatient Hospital Stay (HOSPITAL_BASED_OUTPATIENT_CLINIC_OR_DEPARTMENT_OTHER): Payer: Medicare Other | Admitting: Hematology and Oncology

## 2020-06-26 ENCOUNTER — Inpatient Hospital Stay: Payer: Medicare Other | Attending: Hematology and Oncology

## 2020-06-26 ENCOUNTER — Other Ambulatory Visit: Payer: Self-pay | Admitting: Hematology and Oncology

## 2020-06-26 ENCOUNTER — Other Ambulatory Visit: Payer: Self-pay

## 2020-06-26 DIAGNOSIS — Z170 Estrogen receptor positive status [ER+]: Secondary | ICD-10-CM

## 2020-06-26 DIAGNOSIS — Z23 Encounter for immunization: Secondary | ICD-10-CM | POA: Insufficient documentation

## 2020-06-26 DIAGNOSIS — C50211 Malignant neoplasm of upper-inner quadrant of right female breast: Secondary | ICD-10-CM

## 2020-06-26 DIAGNOSIS — Z95828 Presence of other vascular implants and grafts: Secondary | ICD-10-CM

## 2020-06-26 DIAGNOSIS — Z5112 Encounter for antineoplastic immunotherapy: Secondary | ICD-10-CM | POA: Insufficient documentation

## 2020-06-26 DIAGNOSIS — G620 Drug-induced polyneuropathy: Secondary | ICD-10-CM | POA: Insufficient documentation

## 2020-06-26 LAB — CMP (CANCER CENTER ONLY)
ALT: 20 U/L (ref 0–44)
AST: 23 U/L (ref 15–41)
Albumin: 3.7 g/dL (ref 3.5–5.0)
Alkaline Phosphatase: 62 U/L (ref 38–126)
Anion gap: 5 (ref 5–15)
BUN: 16 mg/dL (ref 8–23)
CO2: 27 mmol/L (ref 22–32)
Calcium: 9.5 mg/dL (ref 8.9–10.3)
Chloride: 108 mmol/L (ref 98–111)
Creatinine: 0.79 mg/dL (ref 0.44–1.00)
GFR, Estimated: >60 mL/min (ref >60)
Glucose, Bld: 85 mg/dL (ref 70–99)
Potassium: 4.2 mmol/L (ref 3.5–5.1)
Sodium: 140 mmol/L (ref 135–145)
Total Bilirubin: 0.7 mg/dL (ref 0.3–1.2)
Total Protein: 6.4 g/dL — ABNORMAL LOW (ref 6.5–8.1)

## 2020-06-26 LAB — CBC WITH DIFFERENTIAL (CANCER CENTER ONLY)
Abs Immature Granulocytes: 0.02 10*3/uL (ref 0.00–0.07)
Basophils Absolute: 0 10*3/uL (ref 0.0–0.1)
Basophils Relative: 1 %
Eosinophils Absolute: 0.1 10*3/uL (ref 0.0–0.5)
Eosinophils Relative: 1 %
HCT: 39.3 % (ref 36.0–46.0)
Hemoglobin: 12.9 g/dL (ref 12.0–15.0)
Immature Granulocytes: 0 %
Lymphocytes Relative: 23 %
Lymphs Abs: 1.1 10*3/uL (ref 0.7–4.0)
MCH: 29.6 pg (ref 26.0–34.0)
MCHC: 32.8 g/dL (ref 30.0–36.0)
MCV: 90.1 fL (ref 80.0–100.0)
Monocytes Absolute: 0.4 10*3/uL (ref 0.1–1.0)
Monocytes Relative: 8 %
Neutro Abs: 3.1 10*3/uL (ref 1.7–7.7)
Neutrophils Relative %: 67 %
Platelet Count: 178 10*3/uL (ref 150–400)
RBC: 4.36 MIL/uL (ref 3.87–5.11)
RDW: 13.8 % (ref 11.5–15.5)
WBC Count: 4.6 10*3/uL (ref 4.0–10.5)
nRBC: 0 % (ref 0.0–0.2)

## 2020-06-26 MED ORDER — ACETAMINOPHEN 325 MG PO TABS
ORAL_TABLET | ORAL | Status: AC
  Filled 2020-06-26: qty 2

## 2020-06-26 MED ORDER — SODIUM CHLORIDE 0.9 % IV SOLN
Freq: Once | INTRAVENOUS | Status: AC
  Filled 2020-06-26: qty 250

## 2020-06-26 MED ORDER — ACETAMINOPHEN 325 MG PO TABS
650.0000 mg | ORAL_TABLET | Freq: Once | ORAL | Status: AC
  Administered 2020-06-26: 650 mg via ORAL

## 2020-06-26 MED ORDER — HEPARIN SOD (PORK) LOCK FLUSH 100 UNIT/ML IV SOLN
500.0000 [IU] | Freq: Once | INTRAVENOUS | Status: AC | PRN
  Administered 2020-06-26: 500 [IU]
  Filled 2020-06-26: qty 5

## 2020-06-26 MED ORDER — DIPHENHYDRAMINE HCL 25 MG PO CAPS
50.0000 mg | ORAL_CAPSULE | Freq: Once | ORAL | Status: AC
  Administered 2020-06-26: 25 mg via ORAL

## 2020-06-26 MED ORDER — SODIUM CHLORIDE 0.9% FLUSH
10.0000 mL | INTRAVENOUS | Status: DC | PRN
  Administered 2020-06-26: 10 mL
  Filled 2020-06-26: qty 10

## 2020-06-26 MED ORDER — TRASTUZUMAB-DKST CHEMO 150 MG IV SOLR
6.0000 mg/kg | Freq: Once | INTRAVENOUS | Status: AC
  Administered 2020-06-26: 525 mg via INTRAVENOUS
  Filled 2020-06-26: qty 25

## 2020-06-26 MED ORDER — DIPHENHYDRAMINE HCL 25 MG PO CAPS
ORAL_CAPSULE | ORAL | Status: AC
  Filled 2020-06-26: qty 2

## 2020-06-26 NOTE — Patient Instructions (Signed)
[facility] Discharge Instructions for Patients Receiving Chemotherapy  Today you received the following chemotherapy agents trastuzumab.  To help prevent nausea and vomiting after your treatment, we encourage you to take your nausea medication as directed.    If you develop nausea and vomiting that is not controlled by your nausea medication, call the clinic.   BELOW ARE SYMPTOMS THAT SHOULD BE REPORTED IMMEDIATELY:  *FEVER GREATER THAN 100.5 F  *CHILLS WITH OR WITHOUT FEVER  NAUSEA AND VOMITING THAT IS NOT CONTROLLED WITH YOUR NAUSEA MEDICATION  *UNUSUAL SHORTNESS OF BREATH  *UNUSUAL BRUISING OR BLEEDING  TENDERNESS IN MOUTH AND THROAT WITH OR WITHOUT PRESENCE OF ULCERS  *URINARY PROBLEMS  *BOWEL PROBLEMS  UNUSUAL RASH Items with * indicate a potential emergency and should be followed up as soon as possible.  Feel free to call the clinic should you have any questions or concerns. The clinic phone number is [phone].  Please show the CHEMO ALERT CARD at check-in to the Emergency Department and triage nurse.   

## 2020-06-26 NOTE — Assessment & Plan Note (Signed)
09/07/2019:Right lumpectomy [facility]): IDC with calcifications, grade 2, 1.5cm, clear margins, 2 lymph nodes negative.ER 100%, PR 70%, HER-2 positive, Ki-67 15% T1c N0 stage Ia  Treatment plan: 1.Adjuvant chemotherapy with Taxol [name] weekly x12 completed 12/20/2019 followed by [name] maintenance for 1 year 2.Adjuvant radiationcompleted 01/2024 3.Follow-up adjuvant antiestrogen therapy ---------------------------------------------------------------------------------------------------------------------------------------------------- Current treatment:[name] maintenance to be completed December 2021. Echocardiogram 02/25/2020:EF 60 to 65%  [name] toxicities:None Mild intermittent peripheral neuropathy: Monitoring  Anastrozole to start in mid June, 2021. Anastrozole toxicities:Denies any worsening hot flashes or arthralgias or myalgias.  Enrolled in my chart care companion AET monitoring study.  Return to clinic every 3 weeks for [name] every 6 weeks to follow-up with me with labs.

## 2020-06-27 ENCOUNTER — Telehealth: Payer: Self-pay | Admitting: Hematology and Oncology

## 2020-06-27 NOTE — Telephone Encounter (Signed)
Per 10/7 los, I sent a secure chat for the [facility] scheduler to Cancel ECHO on 09/01/20

## 2020-07-17 ENCOUNTER — Inpatient Hospital Stay: Payer: Medicare Other

## 2020-07-17 ENCOUNTER — Other Ambulatory Visit: Payer: Self-pay

## 2020-07-17 VITALS — BP 110/53 | HR 70 | Temp 98.1°F | Resp 16 | Wt 190.0 lb

## 2020-07-17 DIAGNOSIS — Z170 Estrogen receptor positive status [ER+]: Secondary | ICD-10-CM

## 2020-07-17 DIAGNOSIS — Z23 Encounter for immunization: Secondary | ICD-10-CM

## 2020-07-17 DIAGNOSIS — C50211 Malignant neoplasm of upper-inner quadrant of right female breast: Secondary | ICD-10-CM

## 2020-07-17 DIAGNOSIS — Z5112 Encounter for antineoplastic immunotherapy: Secondary | ICD-10-CM | POA: Diagnosis not present

## 2020-07-17 MED ORDER — INFLUENZA VAC A&B SA ADJ QUAD 0.5 ML IM PRSY
0.5000 mL | PREFILLED_SYRINGE | Freq: Once | INTRAMUSCULAR | Status: AC
  Administered 2020-07-17: 0.5 mL via INTRAMUSCULAR

## 2020-07-17 MED ORDER — DIPHENHYDRAMINE HCL 25 MG PO CAPS
ORAL_CAPSULE | ORAL | Status: AC
  Filled 2020-07-17: qty 1

## 2020-07-17 MED ORDER — SODIUM CHLORIDE 0.9% FLUSH
10.0000 mL | INTRAVENOUS | Status: DC | PRN
  Administered 2020-07-17: 10 mL
  Filled 2020-07-17: qty 10

## 2020-07-17 MED ORDER — TRASTUZUMAB-DKST CHEMO 150 MG IV SOLR
6.0000 mg/kg | Freq: Once | INTRAVENOUS | Status: AC
  Administered 2020-07-17: 525 mg via INTRAVENOUS
  Filled 2020-07-17: qty 25

## 2020-07-17 MED ORDER — HEPARIN SOD (PORK) LOCK FLUSH 100 UNIT/ML IV SOLN
500.0000 [IU] | Freq: Once | INTRAVENOUS | Status: AC | PRN
  Administered 2020-07-17: 500 [IU]
  Filled 2020-07-17: qty 5

## 2020-07-17 MED ORDER — ACETAMINOPHEN 325 MG PO TABS
650.0000 mg | ORAL_TABLET | Freq: Once | ORAL | Status: AC
  Administered 2020-07-17: 650 mg via ORAL

## 2020-07-17 MED ORDER — INFLUENZA VAC A&B SA ADJ QUAD 0.5 ML IM PRSY
PREFILLED_SYRINGE | INTRAMUSCULAR | Status: AC
  Filled 2020-07-17: qty 0.5

## 2020-07-17 MED ORDER — SODIUM CHLORIDE 0.9 % IV SOLN
Freq: Once | INTRAVENOUS | Status: AC
  Filled 2020-07-17: qty 250

## 2020-07-17 MED ORDER — DIPHENHYDRAMINE HCL 25 MG PO CAPS
25.0000 mg | ORAL_CAPSULE | Freq: Once | ORAL | Status: AC
  Administered 2020-07-17: 25 mg via ORAL

## 2020-07-17 MED ORDER — ACETAMINOPHEN 325 MG PO TABS
ORAL_TABLET | ORAL | Status: AC
  Filled 2020-07-17: qty 2

## 2020-07-17 NOTE — Patient Instructions (Signed)
[facility] Discharge Instructions for Patients Receiving Chemotherapy  Today you received the following chemotherapy agents trastuzumab.  To help prevent nausea and vomiting after your treatment, we encourage you to take your nausea medication as directed.    If you develop nausea and vomiting that is not controlled by your nausea medication, call the clinic.   BELOW ARE SYMPTOMS THAT SHOULD BE REPORTED IMMEDIATELY:  *FEVER GREATER THAN 100.5 F  *CHILLS WITH OR WITHOUT FEVER  NAUSEA AND VOMITING THAT IS NOT CONTROLLED WITH YOUR NAUSEA MEDICATION  *UNUSUAL SHORTNESS OF BREATH  *UNUSUAL BRUISING OR BLEEDING  TENDERNESS IN MOUTH AND THROAT WITH OR WITHOUT PRESENCE OF ULCERS  *URINARY PROBLEMS  *BOWEL PROBLEMS  UNUSUAL RASH Items with * indicate a potential emergency and should be followed up as soon as possible.  Feel free to call the clinic should you have any questions or concerns. The clinic phone number is [phone].  Please show the CHEMO ALERT CARD at check-in to the Emergency Department and triage nurse.   

## 2020-08-04 ENCOUNTER — Encounter: Payer: Self-pay | Admitting: Hematology and Oncology

## 2020-08-06 NOTE — Progress Notes (Signed)
Patient Care Team: Carol Ada, MD as PCP - General (Family Medicine) Bensimhon, Shaune Pascal, MD as PCP - Advanced Heart Failure (Cardiology) Rockwell Germany, RN as Oncology Nurse Navigator Mauro Kaufmann, RN as Oncology Nurse Navigator Nicholas Lose, MD as Consulting Physician (Hematology and Oncology) Kyung Rudd, MD as Consulting Physician (Radiation Oncology) Stark Klein, MD as Consulting Physician (General Surgery) Kyung Rudd, MD as Consulting Physician (Radiation Oncology)  DIAGNOSIS:    ICD-10-CM   1. Malignant neoplasm of 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, estrogen receptor positive ([place])  C50.211    Z17.0     SUMMARY OF ONCOLOGIC HISTORY: Oncology History  Malignant neoplasm of 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, estrogen receptor positive ([place])  08/17/2019 Initial Diagnosis   Routine screening mammogram detected a 0.8cm indeterminate mass in the right breast. Biopsy showed invasive ductal carcinoma, grade 2, HER-2 + by FISH, ER+ 100%, PR+ 70%, Ki67 15%.    08/22/2019 Cancer Staging   Staging form: Breast, AJCC 8th Edition - Clinical stage from 08/22/2019: Stage IA (cT1b, cN0, cM0, G2, ER+, PR+, HER2+) - Signed by Nicholas Lose, MD on 08/22/2019   09/07/2019 Surgery   Right lumpectomy [facility]): IDC with calcifications, grade 2, 1.5cm, clear margins, 2 lymph nodes negative.    10/04/2019 -  Adjuvant Chemotherapy   Weekly Taxol and [name] x 12 completing on 12/20/2019, then maintenance [name] through the rest of 2021.   01/17/2020 - 02/12/2020 Radiation Therapy   Adjuvant radiation therapy   03/04/2020 -  Anti-estrogen oral therapy   Anastrozole switched to letrozole 08/20/2020 for muscle stiffness     CHIEF COMPLIANT: [name] maintenance  INTERVAL HISTORY: Amy Harrington is a 69 y.o. with above-mentioned history of right breast cancer who underwent a lumpectomy, adjuvant chemotherapy,radiation,and is currently on [name]  maintenanceand antiestrogen therapy with anastrozole. She presents to the clinic todayfor treatment.   Complaining of muscle stiffness especially when she walks up the steps.  She has not been able to walk up the steps of her son's house.  She has to take multiple breaks.  This is especially worse after she sits down for a while.  ALLERGIES:  has No Known Allergies.  MEDICATIONS:  Current Outpatient Medications  Medication Sig Dispense Refill  . esomeprazole (NEXIUM) 20 MG capsule Take 20 mg by mouth daily at 12 noon.    Marland Kitchen letrozole (FEMARA) 2.5 MG tablet Take 1 tablet (2.5 mg total) by mouth daily. 90 tablet 3  . lidocaine-prilocaine (EMLA) cream Apply to affected area once 30 g 3   No current facility-administered medications for this visit.    PHYSICAL EXAMINATION: ECOG PERFORMANCE STATUS: 1 - Symptomatic but completely ambulatory  Vitals:   08/07/20 0933  BP: 127/67  Pulse: 72  Resp: 18  Temp: 98.1 F (36.7 C)  SpO2: 97%   Filed Weights   08/07/20 0933  Weight: 190 lb 12.8 oz (86.5 kg)    LABORATORY DATA:  I have reviewed the data as listed CMP Latest Ref Rng & Units 08/07/2020 06/26/2020 05/15/2020  Glucose 70 - 99 mg/dL 87 85 97  BUN 8 - 23 mg/dL 17 16 14   Creatinine 0.44 - 1.00 mg/dL 0.83 0.79 0.84  Sodium 135 - 145 mmol/L 140 140 141  Potassium 3.5 - 5.1 mmol/L 4.2 4.2 4.2  Chloride 98 - 111 mmol/L 108 108 108  CO2 22 - 32 mmol/L 26 27 27   Calcium 8.9 - 10.3 mg/dL 9.4 9.5 9.9  Total Protein 6.5 -  8.1 g/dL 6.7 6.4(L) 6.4(L)  Total Bilirubin 0.3 - 1.2 mg/dL 0.5 0.7 0.6  Alkaline Phos 38 - 126 U/L 64 62 66  AST 15 - 41 U/L 23 23 24   ALT 0 - 44 U/L 21 20 19     Lab Results  Component Value Date   WBC 6.5 08/07/2020   HGB 13.2 08/07/2020   HCT 39.8 08/07/2020   MCV 89.4 08/07/2020   PLT 175 08/07/2020   NEUTROABS 4.4 08/07/2020    ASSESSMENT & PLAN:  Malignant neoplasm of 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, estrogen receptor positive  ([place]) 09/07/2019:Right lumpectomy ([name]): IDC with calcifications, grade 2, 1.5cm, clear margins, 2 lymph nodes negative.ER 100%, PR 70%, HER-2 positive, Ki-67 15% T1c N0 stage Ia  Treatment plan: 1.Adjuvant chemotherapy with Taxol [name] weekly x12 completed 12/20/2019 followed by [name] maintenance for 1 year 2.Adjuvant radiationcompleted 01/2024 3.Follow-up adjuvant antiestrogen therapy ---------------------------------------------------------------------------------------------------------------------------------------------------- Current treatment:[name] maintenanceto be completedDecember 2021. Echocardiogram 02/25/2020:EF 60 to 65%  [name] toxicities:None Mild intermittent peripheral neuropathy: Monitoring  Anastrozole started June, 2021. Anastrozole toxicities:Patient experiencing muscle aches and stiffness especially if she sits for long period of time.  It is clearly related to anastrozole therapy.  Therefore we will discontinue it and in 2 weeks and she will start letrozole..  I will touch base with her with a telephone visit in 2 months to assess tolerability letter to letrozole. We will see her back in 6 months for follow-up and physical examination.      No orders of the defined types were placed in this encounter.  The patient has a good understanding of the overall plan. she agrees with it. she will call with any problems that may develop before the next visit here.  Total time spent: 30 mins including face to face time and time spent for planning, charting and coordination of care  Nicholas Lose, MD 08/07/2020  I, Cloyde Reams Dorshimer, am acting as scribe for Dr. Nicholas Lose.  I have reviewed the above documentation for accuracy and completeness, and I agree with the above.

## 2020-08-06 NOTE — Assessment & Plan Note (Signed)
09/07/2019:Right lumpectomy [facility]): IDC with calcifications, grade 2, 1.5cm, clear margins, 2 lymph nodes negative.ER 100%, PR 70%, HER-2 positive, Ki-67 15% T1c N0 stage Ia  Treatment plan: 1.Adjuvant chemotherapy with Taxol [name] weekly x12 completed 12/20/2019 followed by [name] maintenance for 1 year 2.Adjuvant radiationcompleted 01/2024 3.Follow-up adjuvant antiestrogen therapy ---------------------------------------------------------------------------------------------------------------------------------------------------- Current treatment:[name] maintenanceto be completedDecember 2021. Echocardiogram 02/25/2020:EF 60 to 65%  [name] toxicities:None Mild intermittent peripheral neuropathy: Monitoring  Anastrozole to start in mid June, 2021. Anastrozole toxicities:Denies any worsening hot flashes or arthralgias or myalgias.  Return to clinic every 3 weeks for [name] every 6 weeks to follow-up with me with labs.

## 2020-08-07 ENCOUNTER — Inpatient Hospital Stay (HOSPITAL_BASED_OUTPATIENT_CLINIC_OR_DEPARTMENT_OTHER): Payer: Medicare Other | Admitting: Hematology and Oncology

## 2020-08-07 ENCOUNTER — Inpatient Hospital Stay: Payer: Medicare Other

## 2020-08-07 ENCOUNTER — Telehealth: Payer: Self-pay | Admitting: Hematology and Oncology

## 2020-08-07 ENCOUNTER — Encounter: Payer: Self-pay | Admitting: *Deleted

## 2020-08-07 ENCOUNTER — Inpatient Hospital Stay: Payer: Medicare Other | Attending: Hematology and Oncology

## 2020-08-07 ENCOUNTER — Other Ambulatory Visit: Payer: Self-pay

## 2020-08-07 DIAGNOSIS — C50211 Malignant neoplasm of upper-inner quadrant of right female breast: Secondary | ICD-10-CM | POA: Diagnosis present

## 2020-08-07 DIAGNOSIS — Z5112 Encounter for antineoplastic immunotherapy: Secondary | ICD-10-CM | POA: Diagnosis present

## 2020-08-07 DIAGNOSIS — G620 Drug-induced polyneuropathy: Secondary | ICD-10-CM | POA: Insufficient documentation

## 2020-08-07 DIAGNOSIS — Z170 Estrogen receptor positive status [ER+]: Secondary | ICD-10-CM

## 2020-08-07 DIAGNOSIS — Z79811 Long term (current) use of aromatase inhibitors: Secondary | ICD-10-CM | POA: Diagnosis not present

## 2020-08-07 DIAGNOSIS — Z95828 Presence of other vascular implants and grafts: Secondary | ICD-10-CM

## 2020-08-07 LAB — CMP (CANCER CENTER ONLY)
ALT: 21 U/L (ref 0–44)
AST: 23 U/L (ref 15–41)
Albumin: 3.8 g/dL (ref 3.5–5.0)
Alkaline Phosphatase: 64 U/L (ref 38–126)
Anion gap: 6 (ref 5–15)
BUN: 17 mg/dL (ref 8–23)
CO2: 26 mmol/L (ref 22–32)
Calcium: 9.4 mg/dL (ref 8.9–10.3)
Chloride: 108 mmol/L (ref 98–111)
Creatinine: 0.83 mg/dL (ref 0.44–1.00)
GFR, Estimated: >60 mL/min (ref >60)
Glucose, Bld: 87 mg/dL (ref 70–99)
Potassium: 4.2 mmol/L (ref 3.5–5.1)
Sodium: 140 mmol/L (ref 135–145)
Total Bilirubin: 0.5 mg/dL (ref 0.3–1.2)
Total Protein: 6.7 g/dL (ref 6.5–8.1)

## 2020-08-07 LAB — CBC WITH DIFFERENTIAL (CANCER CENTER ONLY)
Abs Immature Granulocytes: 0.03 10*3/uL (ref 0.00–0.07)
Basophils Absolute: 0 10*3/uL (ref 0.0–0.1)
Basophils Relative: 1 %
Eosinophils Absolute: 0.1 10*3/uL (ref 0.0–0.5)
Eosinophils Relative: 1 %
HCT: 39.8 % (ref 36.0–46.0)
Hemoglobin: 13.2 g/dL (ref 12.0–15.0)
Immature Granulocytes: 1 %
Lymphocytes Relative: 21 %
Lymphs Abs: 1.4 10*3/uL (ref 0.7–4.0)
MCH: 29.7 pg (ref 26.0–34.0)
MCHC: 33.2 g/dL (ref 30.0–36.0)
MCV: 89.4 fL (ref 80.0–100.0)
Monocytes Absolute: 0.5 10*3/uL (ref 0.1–1.0)
Monocytes Relative: 8 %
Neutro Abs: 4.4 10*3/uL (ref 1.7–7.7)
Neutrophils Relative %: 68 %
Platelet Count: 175 10*3/uL (ref 150–400)
RBC: 4.45 MIL/uL (ref 3.87–5.11)
RDW: 13.5 % (ref 11.5–15.5)
WBC Count: 6.5 10*3/uL (ref 4.0–10.5)
nRBC: 0 % (ref 0.0–0.2)

## 2020-08-07 MED ORDER — DIPHENHYDRAMINE HCL 25 MG PO CAPS
ORAL_CAPSULE | ORAL | Status: AC
  Filled 2020-08-07: qty 1

## 2020-08-07 MED ORDER — SODIUM CHLORIDE 0.9% FLUSH
10.0000 mL | INTRAVENOUS | Status: DC | PRN
  Administered 2020-08-07: 10 mL
  Filled 2020-08-07: qty 10

## 2020-08-07 MED ORDER — ACETAMINOPHEN 325 MG PO TABS
650.0000 mg | ORAL_TABLET | Freq: Once | ORAL | Status: AC
  Administered 2020-08-07: 650 mg via ORAL

## 2020-08-07 MED ORDER — TRASTUZUMAB-DKST CHEMO 150 MG IV SOLR
6.0000 mg/kg | Freq: Once | INTRAVENOUS | Status: AC
  Administered 2020-08-07: 525 mg via INTRAVENOUS
  Filled 2020-08-07: qty 25

## 2020-08-07 MED ORDER — SODIUM CHLORIDE 0.9 % IV SOLN
Freq: Once | INTRAVENOUS | Status: AC
  Filled 2020-08-07: qty 250

## 2020-08-07 MED ORDER — HEPARIN SOD (PORK) LOCK FLUSH 100 UNIT/ML IV SOLN
500.0000 [IU] | Freq: Once | INTRAVENOUS | Status: AC | PRN
  Administered 2020-08-07: 500 [IU]
  Filled 2020-08-07: qty 5

## 2020-08-07 MED ORDER — DIPHENHYDRAMINE HCL 25 MG PO CAPS
25.0000 mg | ORAL_CAPSULE | Freq: Once | ORAL | Status: AC
  Administered 2020-08-07: 25 mg via ORAL

## 2020-08-07 MED ORDER — ACETAMINOPHEN 325 MG PO TABS
ORAL_TABLET | ORAL | Status: AC
  Filled 2020-08-07: qty 2

## 2020-08-07 MED ORDER — LETROZOLE 2.5 MG PO TABS: 2.5000 mg | ORAL_TABLET | Freq: Every day | ORAL | 3 refills | Status: DC

## 2020-08-07 NOTE — Progress Notes (Signed)
Pt discharged in no apparent distress. Pt left ambulatory without assistance. Pt aware of discharge instructions and verbalized understanding and had no further questions.  

## 2020-08-07 NOTE — Telephone Encounter (Signed)
Scheduled appts per 11/18 los. Pt stated she would refer to mychart for appts and AVS details.

## 2020-08-07 NOTE — Patient Instructions (Signed)
[facility] Discharge Instructions for Patients Receiving Chemotherapy  Today you received the following chemotherapy agents trastuzumab.  To help prevent nausea and vomiting after your treatment, we encourage you to take your nausea medication as directed.    If you develop nausea and vomiting that is not controlled by your nausea medication, call the clinic.   BELOW ARE SYMPTOMS THAT SHOULD BE REPORTED IMMEDIATELY:  *FEVER GREATER THAN 100.5 F  *CHILLS WITH OR WITHOUT FEVER  NAUSEA AND VOMITING THAT IS NOT CONTROLLED WITH YOUR NAUSEA MEDICATION  *UNUSUAL SHORTNESS OF BREATH  *UNUSUAL BRUISING OR BLEEDING  TENDERNESS IN MOUTH AND THROAT WITH OR WITHOUT PRESENCE OF ULCERS  *URINARY PROBLEMS  *BOWEL PROBLEMS  UNUSUAL RASH Items with * indicate a potential emergency and should be followed up as soon as possible.  Feel free to call the clinic should you have any questions or concerns. The clinic phone number is [phone].  Please show the CHEMO ALERT CARD at check-in to the Emergency Department and triage nurse.   

## 2020-08-11 ENCOUNTER — Encounter: Payer: Self-pay | Admitting: *Deleted

## 2020-08-28 ENCOUNTER — Inpatient Hospital Stay: Payer: Medicare Other | Attending: Hematology and Oncology

## 2020-08-28 ENCOUNTER — Encounter: Payer: Self-pay | Admitting: *Deleted

## 2020-08-28 ENCOUNTER — Other Ambulatory Visit: Payer: Self-pay

## 2020-08-28 VITALS — BP 130/59 | HR 77 | Temp 98.3°F | Resp 18

## 2020-08-28 DIAGNOSIS — Z5112 Encounter for antineoplastic immunotherapy: Secondary | ICD-10-CM | POA: Diagnosis present

## 2020-08-28 DIAGNOSIS — C50211 Malignant neoplasm of upper-inner quadrant of right female breast: Secondary | ICD-10-CM | POA: Diagnosis present

## 2020-08-28 MED ORDER — DIPHENHYDRAMINE HCL 25 MG PO CAPS
25.0000 mg | ORAL_CAPSULE | Freq: Once | ORAL | Status: AC
  Administered 2020-08-28: 25 mg via ORAL

## 2020-08-28 MED ORDER — SODIUM CHLORIDE 0.9 % IV SOLN
Freq: Once | INTRAVENOUS | Status: AC
  Filled 2020-08-28: qty 250

## 2020-08-28 MED ORDER — HEPARIN SOD (PORK) LOCK FLUSH 100 UNIT/ML IV SOLN
500.0000 [IU] | Freq: Once | INTRAVENOUS | Status: AC | PRN
  Administered 2020-08-28: 500 [IU]
  Filled 2020-08-28: qty 5

## 2020-08-28 MED ORDER — ACETAMINOPHEN 325 MG PO TABS
ORAL_TABLET | ORAL | Status: AC
  Filled 2020-08-28: qty 2

## 2020-08-28 MED ORDER — DIPHENHYDRAMINE HCL 25 MG PO CAPS
ORAL_CAPSULE | ORAL | Status: AC
  Filled 2020-08-28: qty 1

## 2020-08-28 MED ORDER — TRASTUZUMAB-DKST CHEMO 150 MG IV SOLR
6.0000 mg/kg | Freq: Once | INTRAVENOUS | Status: AC
  Administered 2020-08-28: 525 mg via INTRAVENOUS
  Filled 2020-08-28: qty 25

## 2020-08-28 MED ORDER — SODIUM CHLORIDE 0.9% FLUSH
10.0000 mL | INTRAVENOUS | Status: DC | PRN
  Administered 2020-08-28: 10 mL
  Filled 2020-08-28: qty 10

## 2020-08-28 MED ORDER — ACETAMINOPHEN 325 MG PO TABS
650.0000 mg | ORAL_TABLET | Freq: Once | ORAL | Status: AC
  Administered 2020-08-28: 650 mg via ORAL

## 2020-08-28 NOTE — Patient Instructions (Signed)
[facility] Discharge Instructions for Patients Receiving Chemotherapy  Today you received the following chemotherapy agents trastuzumab.  To help prevent nausea and vomiting after your treatment, we encourage you to take your nausea medication as directed.    If you develop nausea and vomiting that is not controlled by your nausea medication, call the clinic.   BELOW ARE SYMPTOMS THAT SHOULD BE REPORTED IMMEDIATELY:  *FEVER GREATER THAN 100.5 F  *CHILLS WITH OR WITHOUT FEVER  NAUSEA AND VOMITING THAT IS NOT CONTROLLED WITH YOUR NAUSEA MEDICATION  *UNUSUAL SHORTNESS OF BREATH  *UNUSUAL BRUISING OR BLEEDING  TENDERNESS IN MOUTH AND THROAT WITH OR WITHOUT PRESENCE OF ULCERS  *URINARY PROBLEMS  *BOWEL PROBLEMS  UNUSUAL RASH Items with * indicate a potential emergency and should be followed up as soon as possible.  Feel free to call the clinic should you have any questions or concerns. The clinic phone number is [phone].  Please show the CHEMO ALERT CARD at check-in to the Emergency Department and triage nurse.   

## 2020-08-28 NOTE — Progress Notes (Signed)
Patient discharged in stable condition.

## 2020-09-01 ENCOUNTER — Encounter (HOSPITAL_COMMUNITY): Payer: Medicare Other | Admitting: Internal Medicine

## 2020-09-01 ENCOUNTER — Other Ambulatory Visit (HOSPITAL_COMMUNITY): Payer: Medicare Other

## 2020-09-05 ENCOUNTER — Encounter (HOSPITAL_BASED_OUTPATIENT_CLINIC_OR_DEPARTMENT_OTHER): Payer: Self-pay | Admitting: General Surgery

## 2020-09-05 ENCOUNTER — Other Ambulatory Visit: Payer: Self-pay

## 2020-09-15 ENCOUNTER — Other Ambulatory Visit (HOSPITAL_COMMUNITY)
Admission: RE | Admit: 2020-09-15 | Discharge: 2020-09-15 | Disposition: A | Discharge status: Home / Self Care | Payer: Medicare Other | Source: Ambulatory Visit | Attending: General Surgery | Admitting: General Surgery

## 2020-09-15 DIAGNOSIS — Z01812 Encounter for preprocedural laboratory examination: Secondary | ICD-10-CM | POA: Insufficient documentation

## 2020-09-15 DIAGNOSIS — Z20822 Contact with and (suspected) exposure to covid-19: Secondary | ICD-10-CM | POA: Diagnosis not present

## 2020-09-15 LAB — SARS CORONAVIRUS 2 (TAT 6-24 HRS): SARS Coronavirus 2: NEGATIVE

## 2020-09-16 ENCOUNTER — Ambulatory Visit (HOSPITAL_BASED_OUTPATIENT_CLINIC_OR_DEPARTMENT_OTHER): Payer: Medicare Other | Admitting: Certified Registered"

## 2020-09-16 ENCOUNTER — Encounter (HOSPITAL_BASED_OUTPATIENT_CLINIC_OR_DEPARTMENT_OTHER): Payer: Self-pay | Admitting: General Surgery

## 2020-09-16 ENCOUNTER — Other Ambulatory Visit: Payer: Self-pay

## 2020-09-16 ENCOUNTER — Ambulatory Visit (HOSPITAL_BASED_OUTPATIENT_CLINIC_OR_DEPARTMENT_OTHER)
Admission: RE | Admit: 2020-09-16 | Discharge: 2020-09-16 | Disposition: A | Discharge status: Home / Self Care | Payer: Medicare Other | Attending: General Surgery | Admitting: General Surgery

## 2020-09-16 ENCOUNTER — Encounter (HOSPITAL_BASED_OUTPATIENT_CLINIC_OR_DEPARTMENT_OTHER)
Admission: RE | Disposition: A | Discharge status: Home / Self Care | Payer: Self-pay | Source: Home / Self Care | Attending: General Surgery

## 2020-09-16 DIAGNOSIS — Z79899 Other long term (current) drug therapy: Secondary | ICD-10-CM | POA: Diagnosis not present

## 2020-09-16 DIAGNOSIS — Z79811 Long term (current) use of aromatase inhibitors: Secondary | ICD-10-CM | POA: Insufficient documentation

## 2020-09-16 DIAGNOSIS — Z923 Personal history of irradiation: Secondary | ICD-10-CM | POA: Insufficient documentation

## 2020-09-16 DIAGNOSIS — Z9221 Personal history of antineoplastic chemotherapy: Secondary | ICD-10-CM | POA: Diagnosis not present

## 2020-09-16 DIAGNOSIS — Z452 Encounter for adjustment and management of vascular access device: Secondary | ICD-10-CM | POA: Diagnosis not present

## 2020-09-16 DIAGNOSIS — C50911 Malignant neoplasm of unspecified site of right female breast: Secondary | ICD-10-CM | POA: Insufficient documentation

## 2020-09-16 HISTORY — PX: PORT-A-CATH REMOVAL: SHX5289

## 2020-09-16 SURGERY — REMOVAL PORT-A-CATH
Anesthesia: Monitor Anesthesia Care | Site: Chest

## 2020-09-16 MED ORDER — LIDOCAINE-EPINEPHRINE (PF) 1 %-1:200000 IJ SOLN
INTRAMUSCULAR | Status: DC | PRN
  Administered 2020-09-16: 14 mL via INTRAMUSCULAR

## 2020-09-16 MED ORDER — ONDANSETRON HCL 4 MG/2ML IJ SOLN
INTRAMUSCULAR | Status: DC | PRN
  Administered 2020-09-16: 4 mg via INTRAVENOUS

## 2020-09-16 MED ORDER — ACETAMINOPHEN 10 MG/ML IV SOLN: 1000.0000 mg | Freq: Once | INTRAVENOUS | Status: DC | PRN

## 2020-09-16 MED ORDER — FENTANYL CITRATE (PF) 100 MCG/2ML IJ SOLN: 25.0000 ug | INTRAMUSCULAR | Status: DC | PRN

## 2020-09-16 MED ORDER — PROPOFOL 10 MG/ML IV BOLUS
INTRAVENOUS | Status: AC
  Filled 2020-09-16: qty 20

## 2020-09-16 MED ORDER — PROPOFOL 500 MG/50ML IV EMUL
INTRAVENOUS | Status: DC | PRN
  Administered 2020-09-16: 100 ug/kg/min via INTRAVENOUS

## 2020-09-16 MED ORDER — FENTANYL CITRATE (PF) 100 MCG/2ML IJ SOLN
INTRAMUSCULAR | Status: DC | PRN
  Administered 2020-09-16 (×2): 50 ug via INTRAVENOUS

## 2020-09-16 MED ORDER — MIDAZOLAM HCL 2 MG/2ML IJ SOLN
INTRAMUSCULAR | Status: AC
  Filled 2020-09-16: qty 2

## 2020-09-16 MED ORDER — MIDAZOLAM HCL 5 MG/5ML IJ SOLN
INTRAMUSCULAR | Status: DC | PRN
  Administered 2020-09-16: 2 mg via INTRAVENOUS

## 2020-09-16 MED ORDER — OXYCODONE HCL 5 MG PO TABS: 5.0000 mg | ORAL_TABLET | Freq: Four times a day (QID) | ORAL | 0 refills | Status: DC | PRN

## 2020-09-16 MED ORDER — ONDANSETRON HCL 4 MG/2ML IJ SOLN: 4.0000 mg | Freq: Once | INTRAMUSCULAR | Status: DC | PRN

## 2020-09-16 MED ORDER — SCOPOLAMINE 1 MG/3DAYS TD PT72: 1.0000 | MEDICATED_PATCH | TRANSDERMAL | Status: DC

## 2020-09-16 MED ORDER — LACTATED RINGERS IV SOLN: INTRAVENOUS | Status: DC

## 2020-09-16 SURGICAL SUPPLY — 33 items
ADH SKN CLS APL DERMABOND .7 (GAUZE/BANDAGES/DRESSINGS) ×1
APL PRP STRL LF DISP 70% ISPRP (MISCELLANEOUS) ×1
BLADE HEX COATED 2.75 (ELECTRODE) ×2 IMPLANT
BLADE SURG 15 STRL LF DISP TIS (BLADE) ×1 IMPLANT
BLADE SURG 15 STRL SS (BLADE) ×2
CANISTER SUCT 1200ML W/VALVE (MISCELLANEOUS) IMPLANT
CHLORAPREP W/TINT 26 (MISCELLANEOUS) ×2 IMPLANT
COVER BACK TABLE 60X90IN (DRAPES) ×2 IMPLANT
COVER MAYO STAND STRL (DRAPES) ×2 IMPLANT
COVER WAND RF STERILE (DRAPES) IMPLANT
DECANTER SPIKE VIAL GLASS SM (MISCELLANEOUS) IMPLANT
DERMABOND ADVANCED (GAUZE/BANDAGES/DRESSINGS) ×1
DERMABOND ADVANCED .7 DNX12 (GAUZE/BANDAGES/DRESSINGS) ×1 IMPLANT
DRAPE LAPAROTOMY 100X72 PEDS (DRAPES) ×2 IMPLANT
DRAPE UTILITY XL STRL (DRAPES) ×2 IMPLANT
ELECT REM PT RETURN 9FT ADLT (ELECTROSURGICAL) ×2
ELECTRODE REM PT RTRN 9FT ADLT (ELECTROSURGICAL) ×1 IMPLANT
GLOVE SURG ENC MOIS LTX SZ6 (GLOVE) ×2 IMPLANT
GLOVE SURG UNDER POLY LF SZ6.5 (GLOVE) ×2 IMPLANT
GOWN STRL REUS W/ TWL LRG LVL3 (GOWN DISPOSABLE) ×1 IMPLANT
GOWN STRL REUS W/TWL 2XL LVL3 (GOWN DISPOSABLE) ×2 IMPLANT
GOWN STRL REUS W/TWL LRG LVL3 (GOWN DISPOSABLE) ×2
NEEDLE HYPO 25X1 1.5 SAFETY (NEEDLE) ×2 IMPLANT
NS IRRIG 1000ML POUR BTL (IV SOLUTION) IMPLANT
PACK BASIN DAY SURGERY FS (CUSTOM PROCEDURE TRAY) ×2 IMPLANT
PENCIL SMOKE EVACUATOR (MISCELLANEOUS) ×2 IMPLANT
SUT MNCRL AB 4-0 PS2 18 (SUTURE) ×2 IMPLANT
SUT VIC AB 3-0 SH 27 (SUTURE) ×2
SUT VIC AB 3-0 SH 27X BRD (SUTURE) ×1 IMPLANT
SYR CONTROL 10ML LL (SYRINGE) ×2 IMPLANT
TOWEL GREEN STERILE FF (TOWEL DISPOSABLE) ×2 IMPLANT
TUBE CONNECTING 20X1/4 (TUBING) IMPLANT
YANKAUER SUCT BULB TIP NO VENT (SUCTIONS) IMPLANT

## 2020-09-16 NOTE — Anesthesia Preprocedure Evaluation (Addendum)
Anesthesia Evaluation  Patient identified by MRN, date of birth, ID band Patient awake    Reviewed: Allergy & Precautions, NPO status , Patient's Chart, lab work & pertinent test results  History of Anesthesia Complications (+) PONV and history of anesthetic complications  Airway Mallampati: II  TM Distance: >3 FB Neck ROM: Full    Dental no notable dental hx. (+) Teeth Intact, Dental Advisory Given   Pulmonary neg pulmonary ROS,    Pulmonary exam normal breath sounds clear to auscultation       Cardiovascular Exercise Tolerance: Good Normal cardiovascular exam Rhythm:Regular Rate:Normal     Neuro/Psych negative neurological ROS     GI/Hepatic Neg liver ROS, GERD  ,  Endo/Other  negative endocrine ROS  Renal/GU      Musculoskeletal negative musculoskeletal ROS (+)   Abdominal (+) + obese,   Peds  Hematology negative hematology ROS (+)   Anesthesia Other Findings R breast CA  Reproductive/Obstetrics                            Anesthesia Physical Anesthesia Plan  ASA: II  Anesthesia Plan: MAC   Post-op Pain Management:    Induction: Intravenous  PONV Risk Score and Plan: 4 or greater and Treatment may vary due to age or medical condition, Ondansetron, Dexamethasone, Scopolamine patch - Pre-op and Midazolam  Airway Management Planned: Nasal Cannula and Natural Airway  Additional Equipment: None  Intra-op Plan:   Post-operative Plan:   Informed Consent: I have reviewed the patients History and Physical, chart, labs and discussed the procedure including the risks, benefits and alternatives for the proposed anesthesia with the patient or authorized representative who has indicated his/her understanding and acceptance.     Dental advisory given  Plan Discussed with: CRNA and Anesthesiologist  Anesthesia Plan Comments:        Anesthesia Quick Evaluation

## 2020-09-16 NOTE — Anesthesia Postprocedure Evaluation (Signed)
Anesthesia Post Note  Patient: Luther Hearing  Procedure(s) Performed: REMOVAL PORT-A-CATH (N/A Chest)     Patient location during evaluation: PACU Anesthesia Type: MAC Level of consciousness: awake and alert Pain management: pain level controlled Vital Signs Assessment: post-procedure vital signs reviewed and stable Respiratory status: spontaneous breathing, nonlabored ventilation, respiratory function stable and patient connected to nasal cannula oxygen Cardiovascular status: stable and blood pressure returned to baseline Postop Assessment: no apparent nausea or vomiting Anesthetic complications: no   No complications documented.  Last Vitals:  Vitals:   09/16/20 1330 09/16/20 1345  BP: (!) 151/65 (!) 121/55  Pulse: 68 68  Resp: 14 19  Temp:    SpO2: 98% 100%    Last Pain:  Vitals:   09/16/20 1345  TempSrc:   PainSc: 0-No pain                 Barnet Glasgow

## 2020-09-16 NOTE — Transfer of Care (Signed)
Immediate Anesthesia Transfer of Care Note  Patient: Amy Harrington  Procedure(s) Performed: REMOVAL PORT-A-CATH (N/A Chest)  Patient Location: PACU  Anesthesia Type:mac  Level of Consciousness: awake, alert  and oriented  Airway & Oxygen Therapy: Patient Spontanous Breathing and Patient connected to face mask oxygen  Post-op Assessment: Report given to RN and Post -op Vital signs reviewed and stable  Post vital signs: Reviewed and stable  Last Vitals:  Vitals Value Taken Time  BP    Temp    Pulse    Resp 14 09/16/20 1315  SpO2    Vitals shown include unvalidated device data.  Last Pain:  Vitals:   09/16/20 1154  TempSrc: Oral  PainSc: 0-No pain      Patients Stated Pain Goal: 5 (40/76/80 8811)  Complications: No complications documented.

## 2020-09-16 NOTE — H&P (Signed)
Amy Harrington is an 69 y.o. female.   Chief Complaint: h/o right breast cancer HPI:  Pt is a 69 yo F s/p surgery, radiation, and chemo for right breast cancer.  She desires port removal.    Past Medical History:  Diagnosis Date  . GERD (gastroesophageal reflux disease)   . Plantar fasciitis   . PONV (postoperative nausea and vomiting)     Past Surgical History:  Procedure Laterality Date  . BREAST LUMPECTOMY WITH RADIOACTIVE SEED AND SENTINEL LYMPH NODE BIOPSY Right 09/07/2019   Procedure: RIGHT BREAST LUMPECTOMY WITH RADIOACTIVE SEED AND SENTINEL LYMPH NODE BIOPSY;  Surgeon: Stark Klein, MD;  Location: [place];  Service: General;  Laterality: Right;  . HERNIA REPAIR    . PORTACATH PLACEMENT Left 09/07/2019   Procedure: INSERTION PORT-A-CATH WITH ULTRASOUND;  Surgeon: Stark Klein, MD;  Location: [place];  Service: General;  Laterality: Left;    Family History  Problem Relation Age of Onset  . Stomach cancer Father 58   Social History:  reports that she has never smoked. She has never used smokeless tobacco. She reports previous alcohol use. She reports that she does not use drugs.  Allergies: No Known Allergies  Medications Prior to Admission  Medication Sig Dispense Refill  . esomeprazole (NEXIUM) 20 MG capsule Take 20 mg by mouth daily at 12 noon.    Marland Kitchen letrozole (FEMARA) 2.5 MG tablet Take 1 tablet (2.5 mg total) by mouth daily. 90 tablet 3  . lidocaine-prilocaine (EMLA) cream Apply to affected area once 30 g 3  . Probiotic Product (PROBIOTIC ADVANCED PO) Take by mouth.      Results for orders placed or performed during the hospital encounter of 09/15/20 (from the past 48 hour(s))  SARS CORONAVIRUS 2 (TAT 6-24 HRS) Nasopharyngeal Nasopharyngeal Swab     Status: None   Collection Time: 09/15/20  9:17 AM   Specimen: Nasopharyngeal Swab  Result Value Ref Range   SARS Coronavirus 2 NEGATIVE NEGATIVE    Comment: (NOTE) SARS-CoV-2 target  nucleic acids are NOT DETECTED.  The SARS-CoV-2 RNA is generally detectable in upper and lower respiratory specimens during the acute phase of infection. Negative results do not preclude SARS-CoV-2 infection, do not rule out co-infections with other pathogens, and should not be used as the sole basis for treatment or other patient management decisions. Negative results must be combined with clinical observations, patient history, and epidemiological information. The expected result is Negative.  Fact Sheet for Patients: SugarRoll.be  Fact Sheet for Healthcare Providers: https://www.woods-mathews.com/  This test is not yet approved or cleared by the [place] FDA and  has been authorized for detection and/or diagnosis of SARS-CoV-2 by FDA under an Emergency Use Authorization (EUA). This EUA will remain  in effect (meaning this test can be used) for the duration of the COVID-19 declaration under Se ction 564(b)(1) of the Act, 21 U.S.C. section 360bbb-3(b)(1), unless the authorization is terminated or revoked sooner.  Performed at [facility], [place] [address] [place],  [address]    No results found.  Review of Systems  All other systems reviewed and are negative.   Blood pressure 125/65, pulse 77, temperature 97.8 F (36.6 C), temperature source Oral, resp. rate 18, height 5\' 3"  (1.6 m), weight 86.1 kg, SpO2 98 %. Physical Exam Vitals reviewed.  Constitutional:      General: She is not in acute distress.    Appearance: Normal appearance.  HENT:  Head: Normocephalic and atraumatic.  Cardiovascular:     Rate and Rhythm: Normal rate and regular rhythm.  Pulmonary:     Effort: Pulmonary effort is normal.  Abdominal:     General: Abdomen is flat.  Musculoskeletal:     Cervical back: Neck supple.  Skin:    General: Skin is warm and dry.     Capillary Refill: Capillary refill takes 2 to 3 seconds.  Neurological:      Mental Status: She is alert.  Psychiatric:        Mood and Affect: Mood normal.        Behavior: Behavior normal.        Thought Content: Thought content normal.        Judgment: Judgment normal.      Assessment/Plan H/o right breast cancer Port in place.  Plan removal.  Reviewed procedure and risks.    Almond Lint, MD 09/16/2020, 12:26 PM

## 2020-09-16 NOTE — Op Note (Signed)
  PRE-OPERATIVE DIAGNOSIS:  un-needed Port-A-Cath for right breast cancer  POST-OPERATIVE DIAGNOSIS:  Same   PROCEDURE:  Procedure(s):  REMOVAL PORT-A-CATH  SURGEON:  Surgeon(s):  Stark Klein, MD  ANESTHESIA:   MAC + local  EBL:   Minimal  SPECIMEN:  None  Complications : none known  Procedure:   Pt was  identified in the holding area and taken to the operating room where she was placed supine on the operating room table.  MAC anesthesia was induced.  The left upper chest was prepped and draped.  The prior incision was anesthetized with local anesthetic.  The incision was opened with a #15 blade.  The subcutaneous tissue was divided with the cautery.  The port was identified and the capsule opened.  The four 2-0 prolene sutures were removed.  The port was then removed and pressure held on the tract.  The catheter appeared intact without evidence of breakage, length was 21 cm.  The wound was inspected for hemostasis, which was achieved with cautery.  The wound was closed with 3-0 vicryl deep dermal interrupted sutures and 4-0 Monocryl running subcuticular suture.  The wound was cleaned, dried, and dressed with dermabond.  The patient was awakened from anesthesia and taken to the PACU in stable condition.  Needle, sponge, and instrument counts are correct.

## 2020-09-16 NOTE — Discharge Instructions (Signed)
[facility] °Office Phone Number [phone] ° ° POST OP INSTRUCTIONS ° °Always review your discharge instruction sheet given to you by the facility where your surgery was performed. ° °IF YOU HAVE DISABILITY OR FAMILY LEAVE FORMS, YOU MUST BRING THEM TO THE OFFICE FOR PROCESSING.  DO NOT GIVE THEM TO YOUR DOCTOR. ° °1. A prescription for pain medication may be given to you upon discharge.  Take your pain medication as prescribed, if needed.  If narcotic pain medicine is not needed, then you may take acetaminophen (Tylenol) or ibuprofen (Advil) as needed. °2. Take your usually prescribed medications unless otherwise directed °3. If you need a refill on your pain medication, please contact your pharmacy.  They will contact our office to request authorization.  Prescriptions will not be filled after 5pm or on week-ends. °4. You should eat very light the first 24 hours after surgery, such as soup, crackers, pudding, etc.  Resume your normal diet the day after surgery °5. It is common to experience some constipation if taking pain medication after surgery.  Increasing fluid intake and taking a stool softener will usually help or prevent this problem from occurring.  A mild laxative (Milk of Magnesia or Miralax) should be taken according to package directions if there are no bowel movements after 48 hours. °6. You may shower in 48 hours.  The surgical glue will flake off in 2-3 weeks.   °7. ACTIVITIES:  No strenuous activity or heavy lifting for 1 week.   °a. You may drive when you no longer are taking prescription pain medication, you can comfortably wear a seatbelt, and you can safely maneuver your car and apply brakes. °b. RETURN TO WORK:  __________1 week_______________ °You should see your doctor in the office for a follow-up appointment approximately three-four weeks after your surgery.   ° °WHEN TO CALL YOUR DOCTOR: °1. Fever over 101.0 °2. Nausea and/or vomiting. °3. Extreme swelling or  bruising. °4. Continued bleeding from incision. °5. Increased pain, redness, or drainage from the incision. ° °The clinic staff is available to answer your questions during regular business hours.  Please don’t hesitate to call and ask to speak to one of the nurses for clinical concerns.  If you have a medical emergency, go to the nearest emergency room or call 911.  A surgeon from [facility] is always on call at the hospital. ° °For further questions, please visit [email or web address]  ° ° ° °Post Anesthesia Home Care Instructions ° °Activity: °Get plenty of rest for the remainder of the day. A responsible individual must stay with you for 24 hours following the procedure.  °For the next 24 hours, DO NOT: °-Drive a car °-Operate machinery °-Drink alcoholic beverages °-Take any medication unless instructed by your physician °-Make any legal decisions or sign important papers. ° °Meals: °Start with liquid foods such as gelatin or soup. Progress to regular foods as tolerated. Avoid greasy, spicy, heavy foods. If nausea and/or vomiting occur, drink only clear liquids until the nausea and/or vomiting subsides. Call your physician if vomiting continues. ° °Special Instructions/Symptoms: °Your throat may feel dry or sore from the anesthesia or the breathing tube placed in your throat during surgery. If this causes discomfort, gargle with warm salt water. The discomfort should disappear within 24 hours. ° °If you had a scopolamine patch placed behind your ear for the management of post- operative nausea and/or vomiting: ° °1. The medication in the patch is effective for 72 hours, after which it   should be removed.  Wrap patch in a tissue and discard in the trash. Wash hands thoroughly with soap and water. °2. You may remove the patch earlier than 72 hours if you experience unpleasant side effects which may include dry mouth, dizziness or visual disturbances. °3. Avoid touching the patch. Wash your hands  with soap and water after contact with the patch. °  ° °

## 2020-09-17 ENCOUNTER — Encounter (HOSPITAL_BASED_OUTPATIENT_CLINIC_OR_DEPARTMENT_OTHER): Payer: Self-pay | Admitting: General Surgery

## 2020-10-01 NOTE — Progress Notes (Signed)
HEMATOLOGY-ONCOLOGY TELEPHONE VISIT PROGRESS NOTE  I connected with Luther Hearing on 10/02/2020 at  2:00 PM EST by telephone and verified that I am speaking with the correct person using two identifiers.  I discussed the limitations, risks, security and privacy concerns of performing an evaluation and management service by telephone and the availability of in person appointments.  I also discussed with the patient that there may be a patient responsible charge related to this service. The patient expressed understanding and agreed to proceed.   History of Present Illness: AVIELLA Harrington is a 70 y.o. female with above-mentioned history of right breast cancer who underwent a lumpectomy, adjuvant chemotherapy,radiation,and is currently on [name] maintenanceand antiestrogen therapy with letrozole after she could not tolerate anastrozole. She presents over the phone todayfor a toxicity check.  She is experiencing severe pain in her legs which is making it difficult for her to even walk.  Oncology History  Malignant neoplasm of 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, estrogen receptor positive ([place])  08/17/2019 Initial Diagnosis   Routine screening mammogram detected a 0.8cm indeterminate mass in the right breast. Biopsy showed invasive ductal carcinoma, grade 2, HER-2 + by FISH, ER+ 100%, PR+ 70%, Ki67 15%.    08/22/2019 Cancer Staging   Staging form: Breast, AJCC 8th Edition - Clinical stage from 08/22/2019: Stage IA (cT1b, cN0, cM0, G2, ER+, PR+, HER2+) - Signed by Nicholas Lose, MD on 08/22/2019   09/07/2019 Surgery   Right lumpectomy [facility]): IDC with calcifications, grade 2, 1.5cm, clear margins, 2 lymph nodes negative.    10/04/2019 -  Adjuvant Chemotherapy   Weekly Taxol and [name] x 12 completing on 12/20/2019, then maintenance [name] through the rest of 2021.   01/17/2020 - 02/12/2020 Radiation Therapy   Adjuvant radiation therapy   03/04/2020 -  Anti-estrogen oral therapy    Anastrozole switched to letrozole 08/20/2020 for muscle stiffness     Observations/Objective:     Assessment Plan:  Malignant neoplasm of 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, estrogen receptor positive ([name]) 09/07/2019:Right lumpectomy ([name]): IDC with calcifications, grade 2, 1.5cm, clear margins, 2 lymph nodes negative.ER 100%, PR 70%, HER-2 positive, Ki-67 15% T1c N0 stage Ia  Treatment plan: 1.Adjuvant chemotherapy with Taxol [name] weekly x12 completed 12/20/2019 followed by [name] maintenance for 1 year completed December 2021 2.Adjuvant radiationcompleted 01/2024 3.Follow-up adjuvant antiestrogen therapy initially treated with anastrozole started June 2021 switched to letrozole November 2021 switched to Tamoxifen Feb 2022 due to severe leg pain ------------------------------------------------------------------------------------------------------------------------------   Letrozole toxicities: Severe leg pains Recommended switching to Tamoxifen   Tamoxifen counseling:We discussed the risks and benefits of tamoxifen. These include but not limited to insomnia, hot flashes, mood changes, vaginal dryness, and weight gain. Although rare, serious side effects including endometrial cancer, risk of blood clots were also discussed. We strongly believe that the benefits far outweigh the risks. Patient understands these risks and consented to starting treatment. Planned treatment duration is 10 years.   Breast cancer surveillance: [facility] mammogram 08/20/2020: Benign breast density category Bone density 02/28/2020: T score -1.3: Mild osteopenia recommended exercise and vitamin D   I discussed the assessment and treatment plan with the patient. The patient was provided an opportunity to ask questions and all were answered. The patient agreed with the plan and demonstrated an understanding of the instructions. The patient was advised to call back or seek an in-person  evaluation if the symptoms worsen or if the condition fails to improve as anticipated.   I provided  20 minutes of non-face-to-face time during this encounter.   Rulon Eisenmenger, MD 10/02/2020    I, Molly Dorshimer, am acting as scribe for Nicholas Lose, MD.  I have reviewed the above documentation for accuracy and completeness, and I agree with the above.

## 2020-10-02 ENCOUNTER — Inpatient Hospital Stay: Payer: Medicare Other | Attending: Hematology and Oncology | Admitting: Hematology and Oncology

## 2020-10-02 DIAGNOSIS — Z170 Estrogen receptor positive status [ER+]: Secondary | ICD-10-CM

## 2020-10-02 DIAGNOSIS — C50211 Malignant neoplasm of upper-inner quadrant of right female breast: Secondary | ICD-10-CM | POA: Diagnosis not present

## 2020-10-02 MED ORDER — TAMOXIFEN CITRATE 20 MG PO TABS: 20.0000 mg | ORAL_TABLET | Freq: Every day | ORAL | 0 refills | Status: DC

## 2020-10-02 NOTE — Assessment & Plan Note (Signed)
09/07/2019:Right lumpectomy [facility]): IDC with calcifications, grade 2, 1.5cm, clear margins, 2 lymph nodes negative.ER 100%, PR 70%, HER-2 positive, Ki-67 15% T1c N0 stage Ia  Treatment plan: 1.Adjuvant chemotherapy with Taxol [name] weekly x12 completed 12/20/2019 followed by [name] maintenance for 1 year completed December 2021 2.Adjuvant radiationcompleted 01/2024 3.Follow-up adjuvant antiestrogen therapy initially treated with anastrozole started June 2021 switched to letrozole November 2021 ------------------------------------------------------------------------------------------------------------------------------   Letrozole toxicities:  Breast cancer surveillance: [facility] mammogram 08/20/2020: Benign breast density category Bone density 02/28/2020: T score -1.3: Mild osteopenia recommended exercise and vitamin D

## 2020-10-06 ENCOUNTER — Telehealth: Payer: Self-pay | Admitting: Hematology and Oncology

## 2020-10-06 NOTE — Telephone Encounter (Signed)
No 1/13 los, no changes made to pt schedule  

## 2020-10-08 IMAGING — MG MM PLC BREAST LOC DEV 1ST LESION INC*R*
8 series · 8 of 8 positions shown · non-contrast
Comparison: Previous exam(s).

CLINICAL DATA: Localization for surgery

EXAM:
MAMMOGRAPHIC GUIDED RADIOACTIVE SEED LOCALIZATION OF THE RIGHT
BREAST

[R ML (1 of 4)]
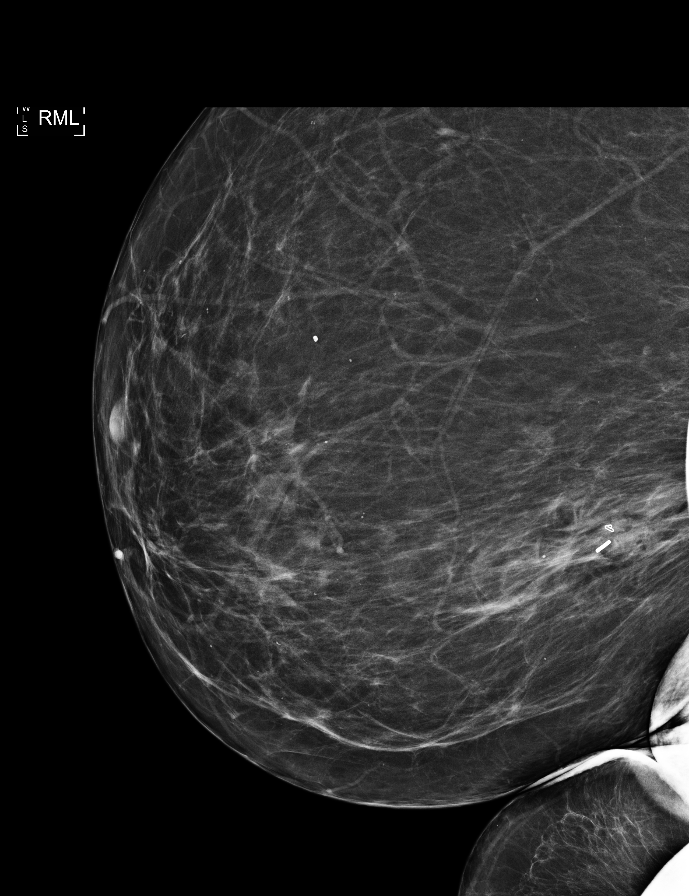

[R CC (1 of 4)]
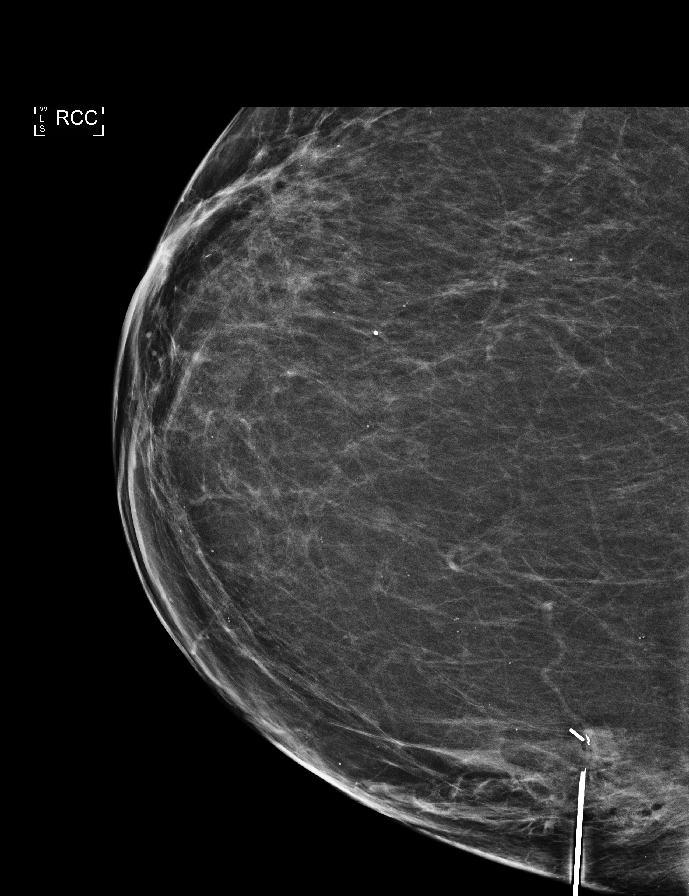

[R ML (2 of 4)]
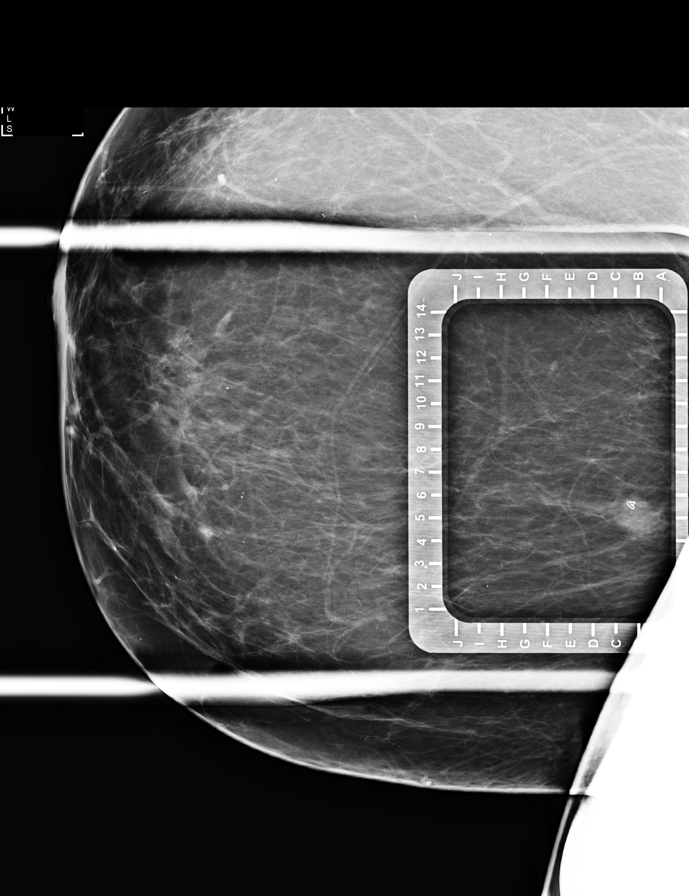

[R CC (2 of 4)]
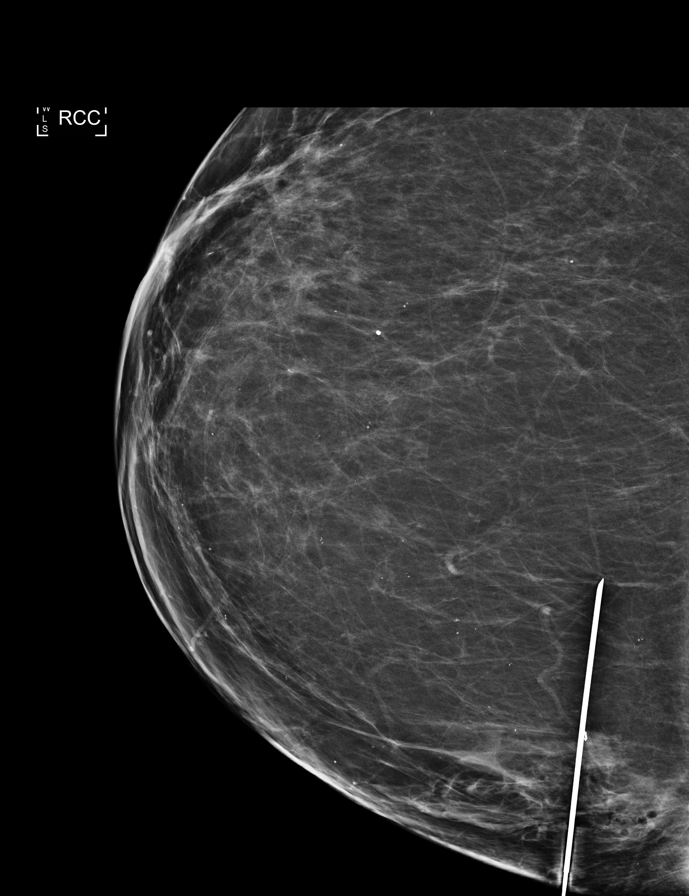

[R ML (3 of 4)]
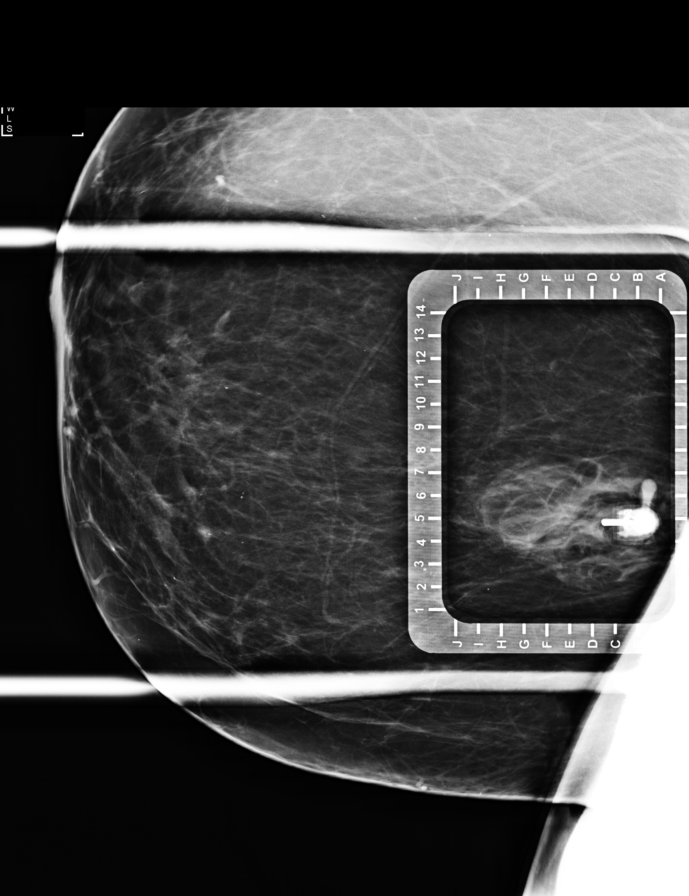

[R CC (3 of 4)]
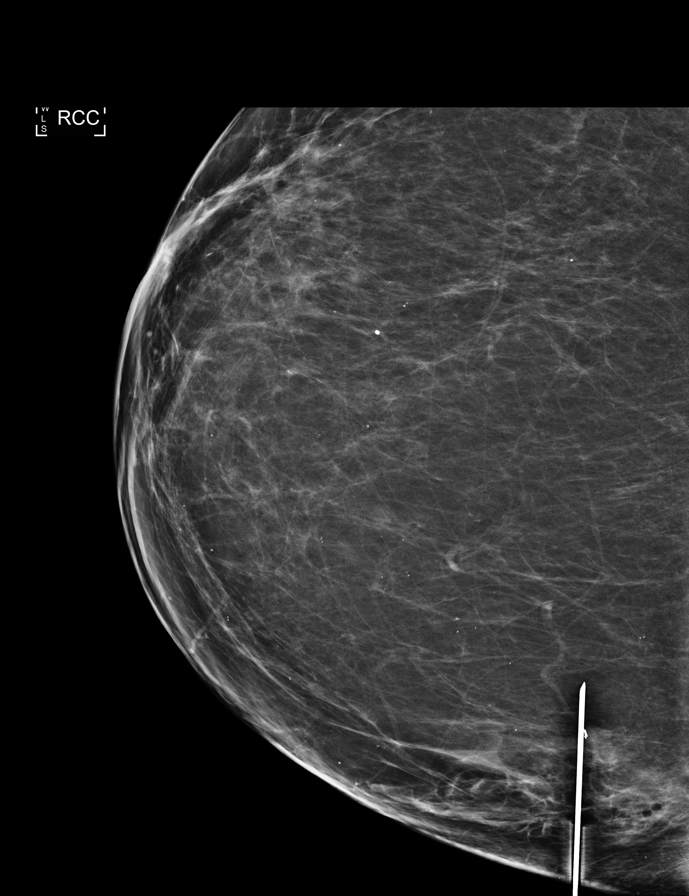

[R CC (4 of 4)]
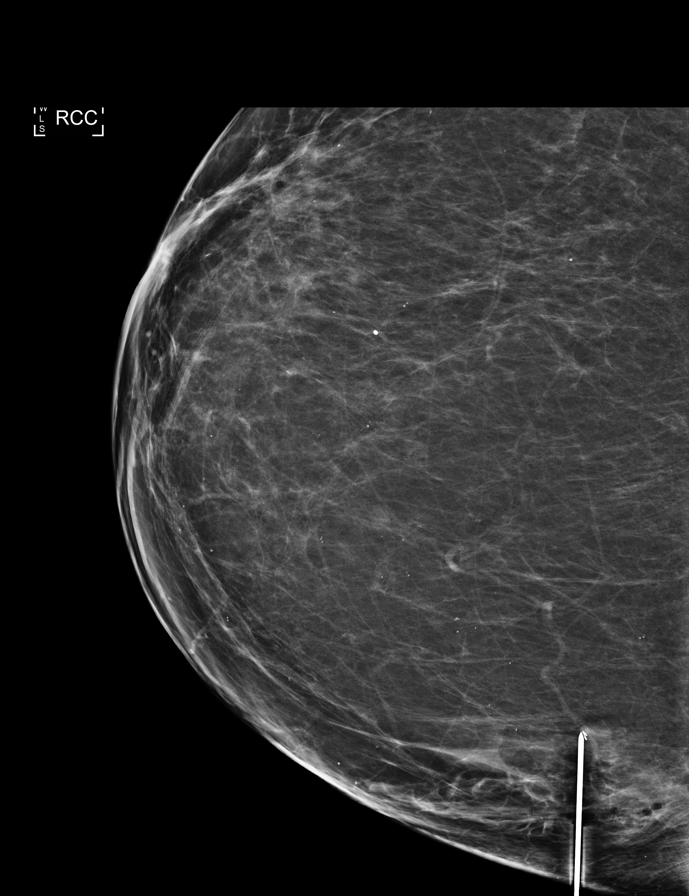

[R ML (4 of 4)]
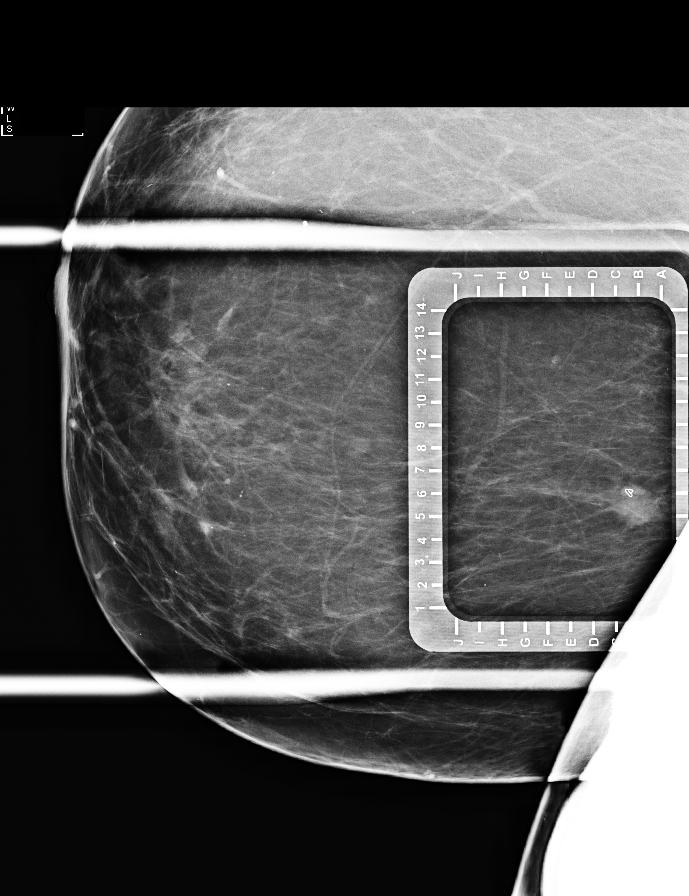

[8 of 8 positions shown; findings below may reference images not displayed]



The usual time-out protocol was performed immediately prior to the
procedure.

Using mammographic guidance, sterile technique, 1% lidocaine and an
[id number] radioactive seed, the right breast mass/biopsy clip were
localized using a medial approach. The follow-up mammogram images
confirm the seed in the expected location and were marked for the
surgeon.

Follow-up survey of the patient confirms presence of the radioactive
seed.

Order number of [id number] seed:  [id number].

Total activity:  0.246 millicuries reference Date: August 20, 2019

The patient tolerated the procedure well and was released from the
[REDACTED]. She was given instructions regarding seed removal.
IMPRESSION: Radioactive seed localization right breast. No apparent
complications.

## 2020-10-09 IMAGING — CR DG CHEST 1V PORT
1 series · 1 of 1 positions shown · non-contrast
Comparison: 03/20/2008

CLINICAL DATA: Port-A-Cath placement.

EXAM:
PORTABLE CHEST 1 VIEW

[chest ap]
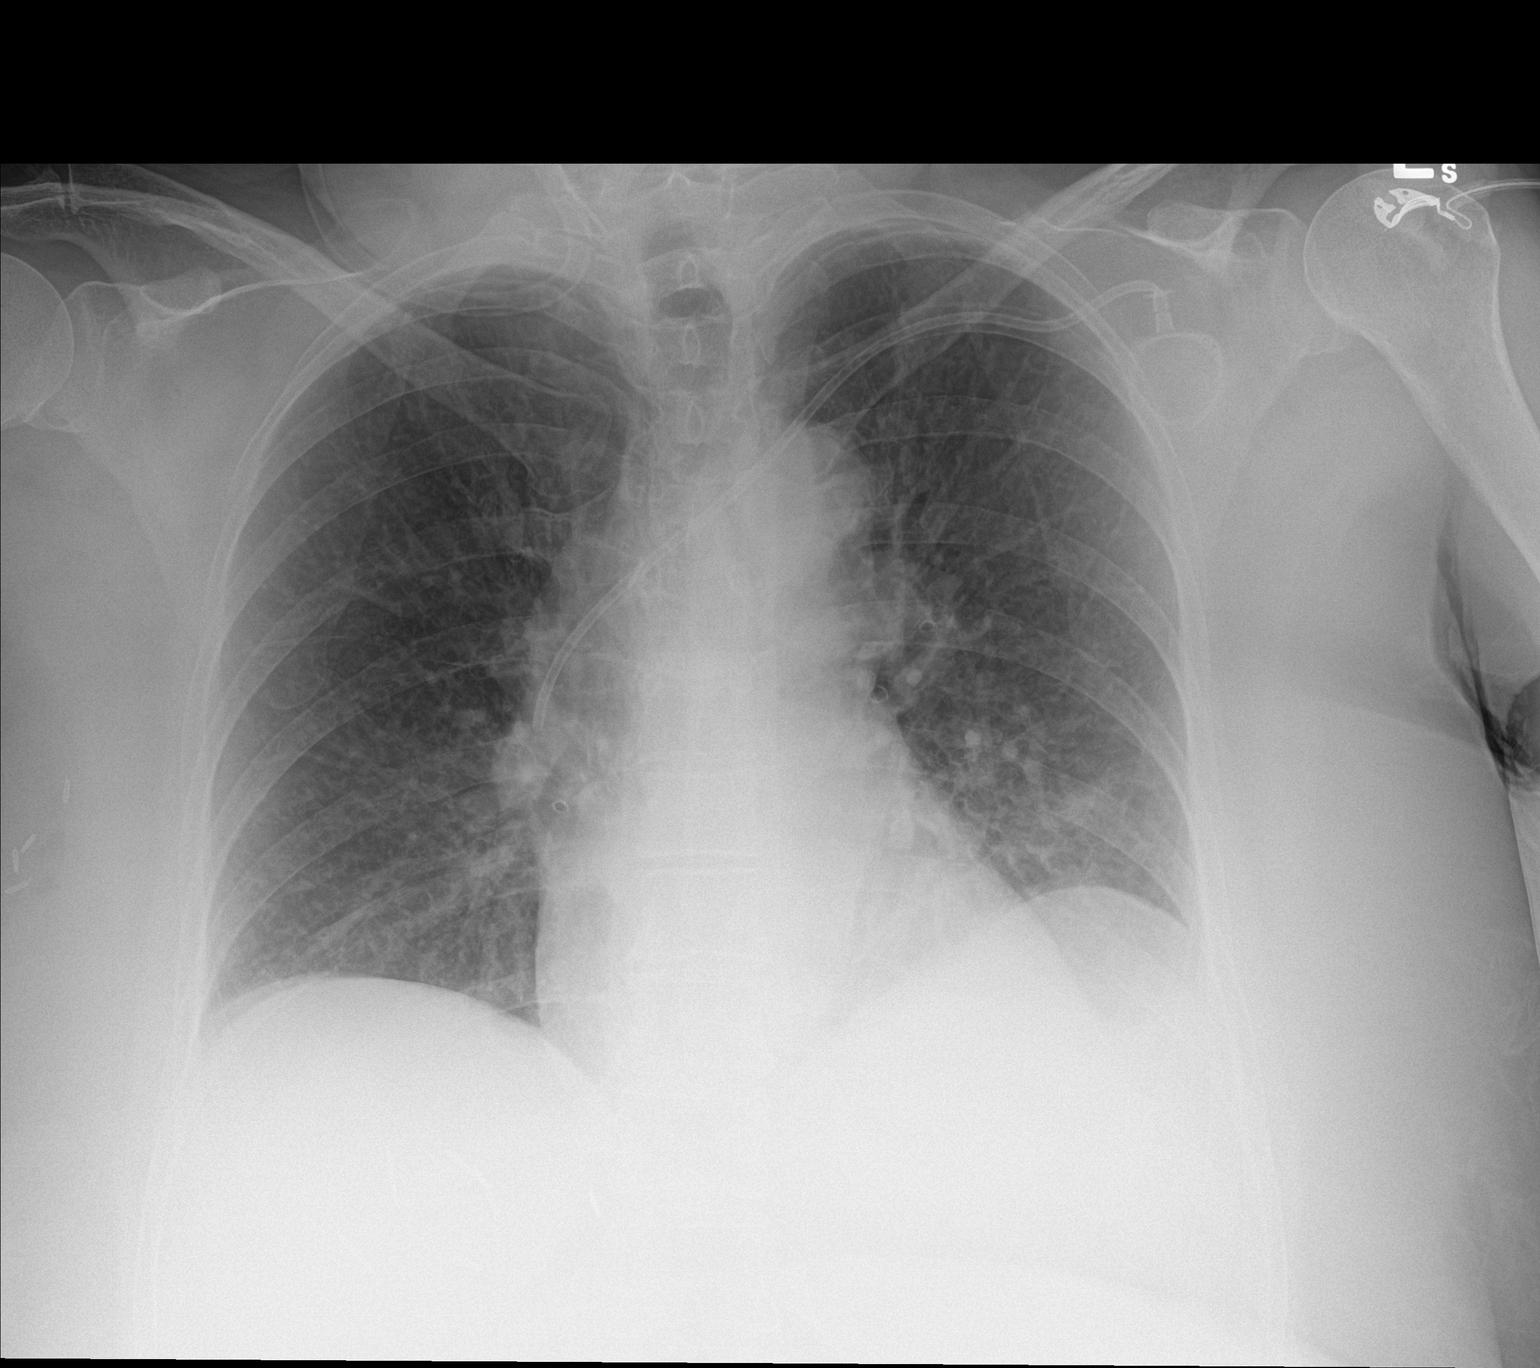

[1 of 1 positions shown; findings below may reference images not displayed]

FINDINGS: Left subclavian Port-A-Cath has tip over the SVC. Lungs are
adequately inflated with subtle hazy density over the left base
likely atelectasis. No effusion. No pneumothorax. Cardiomediastinal
silhouette is within normal. Remainder of the exam is unchanged.
IMPRESSION: Subtle hazy density left base likely atelectasis.

Left subclavian Port-A-Cath with tip over the SVC.  No pneumothorax.

## 2020-10-09 IMAGING — DX MM BREAST SURGICAL SPECIMEN
1 series · 2 of 2 positions shown · non-contrast
Comparison: Previous exam(s).

CLINICAL DATA: Status post RIGHT breast lumpectomy today after
earlier radioactive seed localization.

EXAM:
SPECIMEN RADIOGRAPH OF THE RIGHT BREAST

[Series 2: specimen digital x-ray, derived · right · 2 of 2 slices shown]
[im 1/2]
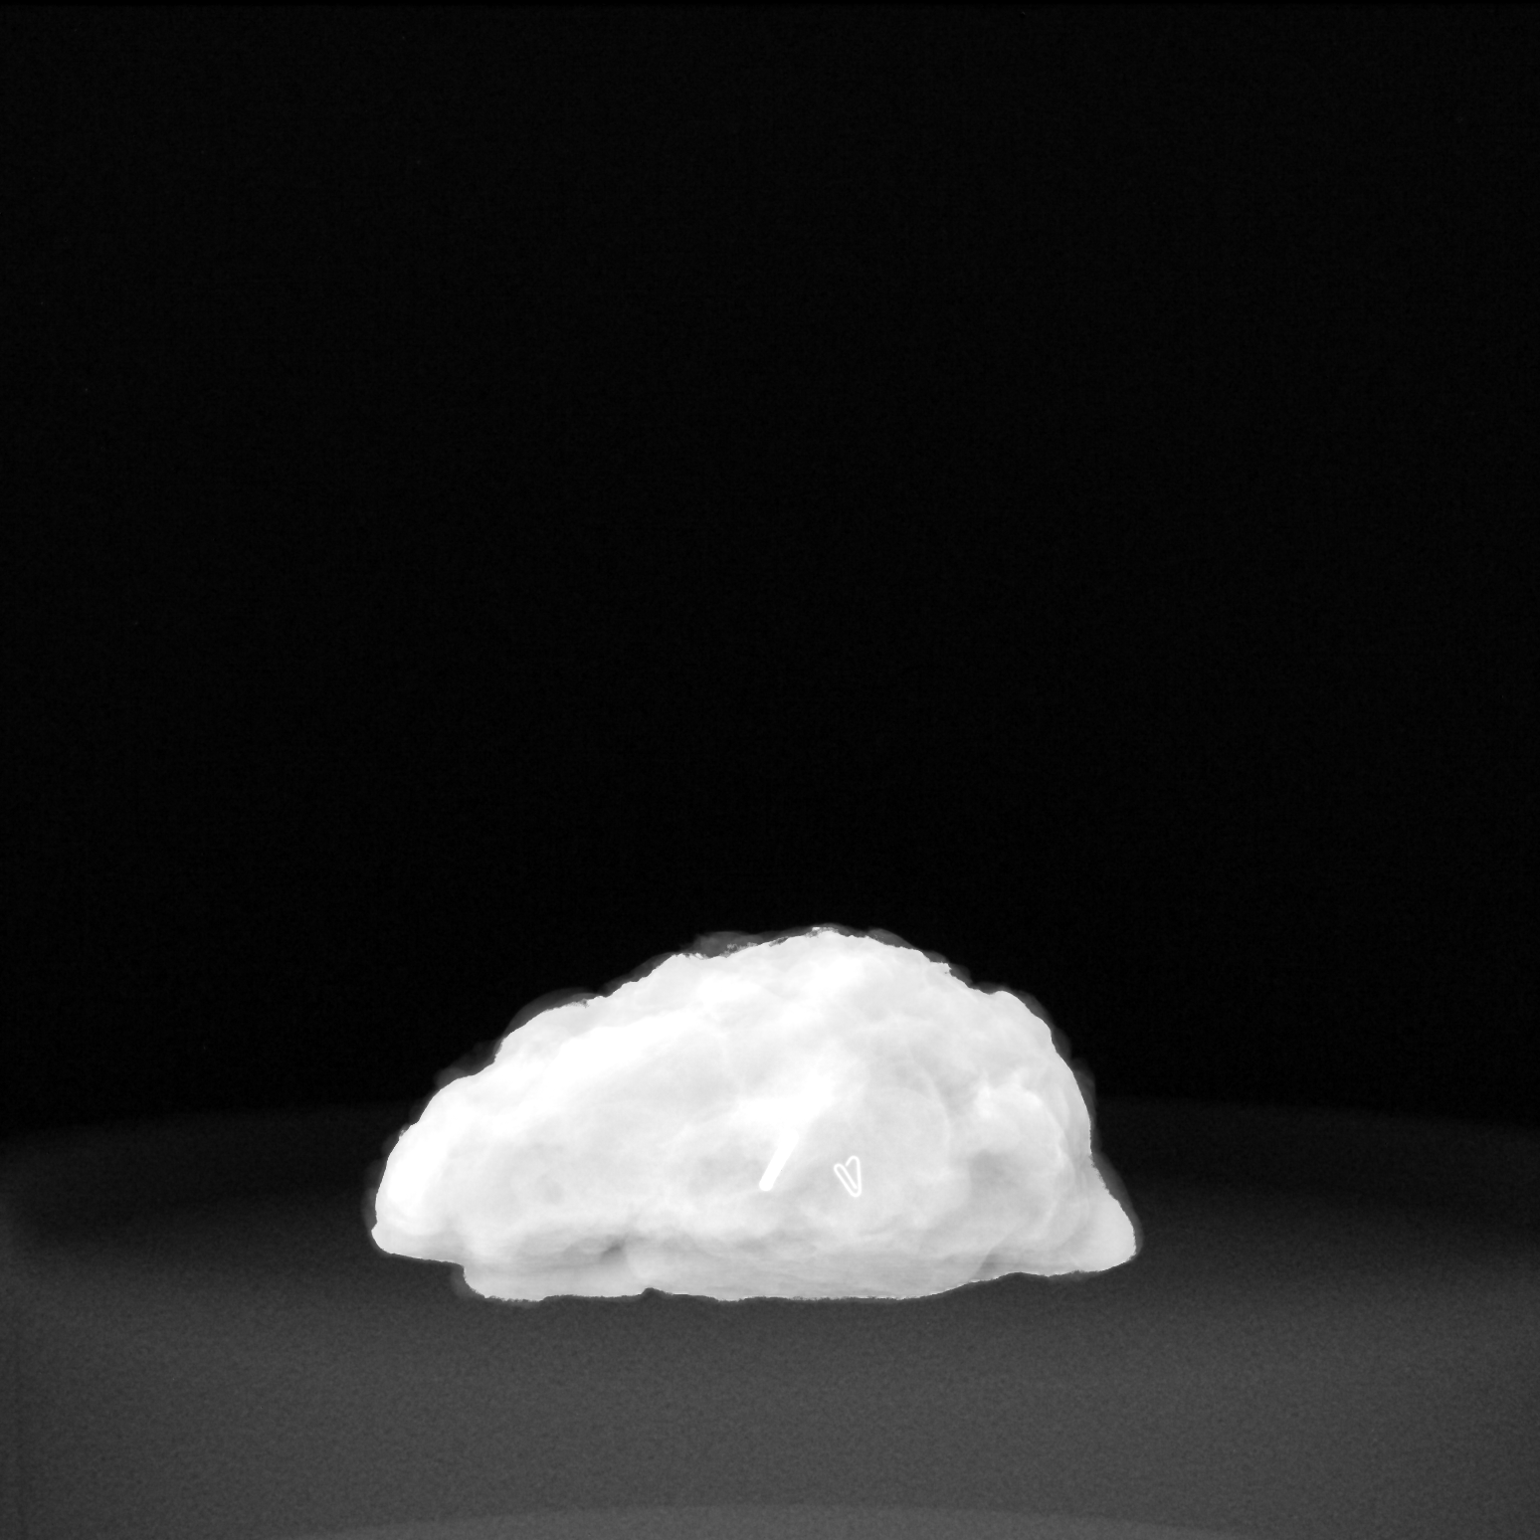
[im 2/2]
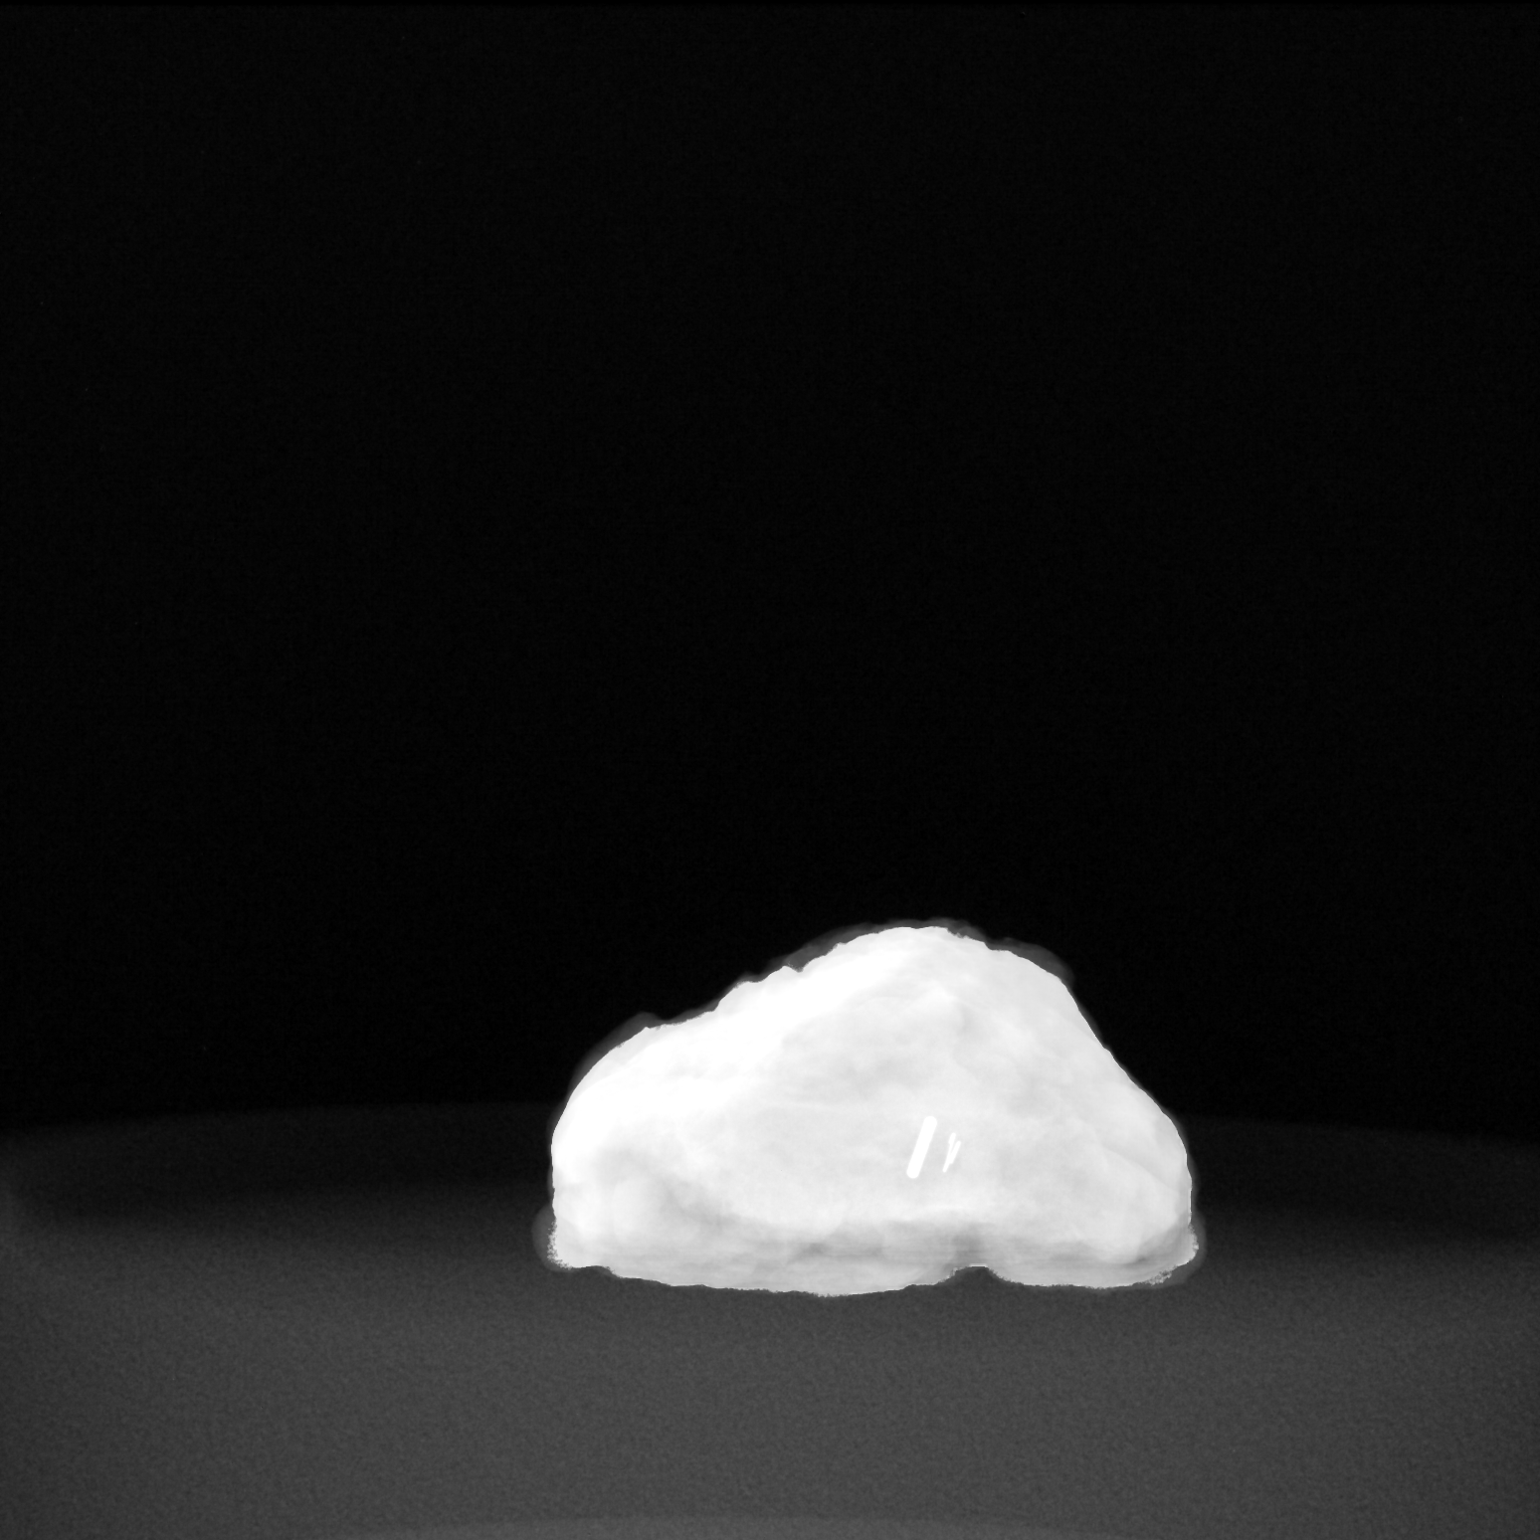

[2 of 2 positions shown; findings below may reference images not displayed]

FINDINGS: Status post excision of the right breast. The radioactive seed and
biopsy marker clip are present and completely intact.
IMPRESSION: Specimen radiograph of the right breast.

## 2020-10-27 ENCOUNTER — Encounter (INDEPENDENT_AMBULATORY_CARE_PROVIDER_SITE_OTHER): Payer: Self-pay

## 2020-11-17 ENCOUNTER — Other Ambulatory Visit: Payer: Self-pay | Admitting: Hematology and Oncology

## 2020-12-08 ENCOUNTER — Encounter (INDEPENDENT_AMBULATORY_CARE_PROVIDER_SITE_OTHER): Payer: Self-pay

## 2020-12-08 ENCOUNTER — Other Ambulatory Visit: Payer: Self-pay | Admitting: Hematology and Oncology

## 2021-01-05 ENCOUNTER — Encounter (INDEPENDENT_AMBULATORY_CARE_PROVIDER_SITE_OTHER): Payer: Self-pay

## 2021-01-08 ENCOUNTER — Other Ambulatory Visit (HOSPITAL_COMMUNITY)
Admission: RE | Admit: 2021-01-08 | Discharge: 2021-01-08 | Disposition: A | Discharge status: Home / Self Care | Payer: Medicare Other | Source: Ambulatory Visit | Attending: Family Medicine | Admitting: Family Medicine

## 2021-01-08 DIAGNOSIS — Z124 Encounter for screening for malignant neoplasm of cervix: Secondary | ICD-10-CM | POA: Insufficient documentation

## 2021-01-08 DIAGNOSIS — Z1151 Encounter for screening for human papillomavirus (HPV): Secondary | ICD-10-CM | POA: Insufficient documentation

## 2021-01-09 LAB — CYTOLOGY - PAP
Comment: NEGATIVE
Diagnosis: NEGATIVE
High risk HPV: NEGATIVE

## 2021-01-13 ENCOUNTER — Other Ambulatory Visit: Payer: Self-pay | Admitting: Hematology and Oncology

## 2021-02-04 ENCOUNTER — Other Ambulatory Visit: Payer: Self-pay

## 2021-02-04 ENCOUNTER — Inpatient Hospital Stay: Payer: Medicare Other | Attending: Hematology and Oncology | Admitting: Hematology and Oncology

## 2021-02-04 DIAGNOSIS — M8580 Other specified disorders of bone density and structure, unspecified site: Secondary | ICD-10-CM | POA: Insufficient documentation

## 2021-02-04 DIAGNOSIS — C50211 Malignant neoplasm of upper-inner quadrant of right female breast: Secondary | ICD-10-CM | POA: Insufficient documentation

## 2021-02-04 DIAGNOSIS — Z170 Estrogen receptor positive status [ER+]: Secondary | ICD-10-CM

## 2021-02-04 DIAGNOSIS — Z79810 Long term (current) use of selective estrogen receptor modulators (SERMs): Secondary | ICD-10-CM | POA: Insufficient documentation

## 2021-02-04 MED ORDER — TAMOXIFEN CITRATE 20 MG PO TABS: 20.0000 mg | ORAL_TABLET | Freq: Every day | ORAL | 3 refills | Status: DC

## 2021-02-04 NOTE — Assessment & Plan Note (Signed)
09/07/2019:Right lumpectomy [facility]): IDC with calcifications, grade 2, 1.5cm, clear margins, 2 lymph nodes negative.ER 100%, PR 70%, HER-2 positive, Ki-67 15% T1c N0 stage Ia  Treatment plan: 1.Adjuvant chemotherapy with Taxol [name] weekly x12 completed 12/20/2019 followed by [name] maintenance for 1 year completed December 2021 2.Adjuvant radiationcompleted 01/2024 3.Follow-up adjuvant antiestrogen therapy initially treated with anastrozole started June 2021 switched to letrozole November 2021 switched to Tamoxifen Feb 2022 due to severe leg pain ------------------------------------------------------------------------------------------------------------------------------  Current treatment: Letrozole toxicities: Severe leg pains, switched to Tamoxifen (10/02/20) Tamoxifen Toxicities:  Breast cancer surveillance: [facility] mammogram 08/20/2020: Benign breast density category Bone density 02/28/2020: T score -1.3: Mild osteopenia recommended exercise and vitamin D

## 2021-02-04 NOTE — Progress Notes (Signed)
Patient Care Team: Carol Ada, MD as PCP - General (Family Medicine) Bensimhon, Shaune Pascal, MD as PCP - Advanced Heart Failure (Cardiology) Rockwell Germany, RN as Oncology Nurse Navigator Mauro Kaufmann, RN as Oncology Nurse Navigator Nicholas Lose, MD as Consulting Physician (Hematology and Oncology) Kyung Rudd, MD as Consulting Physician (Radiation Oncology) Stark Klein, MD as Consulting Physician (General Surgery) Kyung Rudd, MD as Consulting Physician (Radiation Oncology)  DIAGNOSIS:  Encounter Diagnosis  Name Primary?  . Malignant neoplasm of 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, estrogen receptor positive ([place])     SUMMARY OF ONCOLOGIC HISTORY: Oncology History  Malignant neoplasm of 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, estrogen receptor positive ([place])  08/17/2019 Initial Diagnosis   Routine screening mammogram detected a 0.8cm indeterminate mass in the right breast. Biopsy showed invasive ductal carcinoma, grade 2, HER-2 + by FISH, ER+ 100%, PR+ 70%, Ki67 15%.    08/22/2019 Cancer Staging   Staging form: Breast, AJCC 8th Edition - Clinical stage from 08/22/2019: Stage IA (cT1b, cN0, cM0, G2, ER+, PR+, HER2+) - Signed by Nicholas Lose, MD on 08/22/2019   09/07/2019 Surgery   Right lumpectomy [facility]): IDC with calcifications, grade 2, 1.5cm, clear margins, 2 lymph nodes negative.    10/04/2019 -  Adjuvant Chemotherapy   Weekly Taxol and [name] x 12 completing on 12/20/2019, then maintenance [name] through the rest of 2021.   01/17/2020 - 02/12/2020 Radiation Therapy   Adjuvant radiation therapy   03/04/2020 -  Anti-estrogen oral therapy   Anastrozole switched to letrozole 08/20/2020 for muscle stiffness     CHIEF COMPLIANT: Follow-up on tamoxifen  INTERVAL HISTORY: Amy Harrington is a 70 year old with above-mentioned history right breast cancer who is currently on tamoxifen.  Tamoxifen is much better because she does not have the muscle pain  in her legs.  She denies any lumps or nodules in the breast.  She continues to suffer from low energy levels.   ALLERGIES:  has No Known Allergies.  MEDICATIONS:  Current Outpatient Medications  Medication Sig Dispense Refill  . esomeprazole (NEXIUM) 20 MG capsule Take 20 mg by mouth daily at 12 noon.    . lidocaine-prilocaine (EMLA) cream Apply to affected area once 30 g 3  . oxyCODONE (OXY IR/ROXICODONE) 5 MG immediate release tablet Take 1 tablet (5 mg total) by mouth every 6 (six) hours as needed for severe pain. 5 tablet 0  . Probiotic Product (PROBIOTIC ADVANCED PO) Take by mouth.    . tamoxifen (NOLVADEX) 20 MG tablet Take 1 tablet by mouth once daily 30 tablet 0   No current facility-administered medications for this visit.    PHYSICAL EXAMINATION: ECOG PERFORMANCE STATUS: 1 - Symptomatic but completely ambulatory  Vitals:   02/04/21 1009  BP: (!) 127/51  Pulse: 76  Resp: 15  Temp: (!) 97.3 F (36.3 C)  SpO2: 99%   Filed Weights   02/04/21 1009  Weight: 190 lb 4.8 oz (86.3 kg)    BREAST: No palpable masses or nodules in either right or left breasts. No palpable axillary supraclavicular or infraclavicular adenopathy no breast tenderness or nipple discharge. (exam performed in the presence of a chaperone)  LABORATORY DATA:  I have reviewed the data as listed CMP Latest Ref Rng & Units 08/07/2020 06/26/2020 05/15/2020  Glucose 70 - 99 mg/dL 87 85 97  BUN 8 - 23 mg/dL 17 16 14   Creatinine 0.44 - 1.00 mg/dL 0.83 0.79 0.84  Sodium 135 - 145 mmol/L 140  140 141  Potassium 3.5 - 5.1 mmol/L 4.2 4.2 4.2  Chloride 98 - 111 mmol/L 108 108 108  CO2 22 - 32 mmol/L 26 27 27   Calcium 8.9 - 10.3 mg/dL 9.4 9.5 9.9  Total Protein 6.5 - 8.1 g/dL 6.7 6.4(L) 6.4(L)  Total Bilirubin 0.3 - 1.2 mg/dL 0.5 0.7 0.6  Alkaline Phos 38 - 126 U/L 64 62 66  AST 15 - 41 U/L 23 23 24   ALT 0 - 44 U/L 21 20 19     Lab Results  Component Value Date   WBC 6.5 08/07/2020   HGB 13.2 08/07/2020    HCT 39.8 08/07/2020   MCV 89.4 08/07/2020   PLT 175 08/07/2020   NEUTROABS 4.4 08/07/2020    ASSESSMENT & PLAN:  Malignant neoplasm of 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, estrogen receptor positive ([place]) 09/07/2019:Right lumpectomy ([name]): IDC with calcifications, grade 2, 1.5cm, clear margins, 2 lymph nodes negative.ER 100%, PR 70%, HER-2 positive, Ki-67 15% T1c N0 stage Ia  Treatment plan: 1.Adjuvant chemotherapy with Taxol [name] weekly x12 completed 12/20/2019 followed by [name] maintenance for 1 year completed December 2021 2.Adjuvant radiationcompleted 01/2024 3.Follow-up adjuvant antiestrogen therapy initially treated with anastrozole started June 2021 switched to letrozole November 2021 switched to Tamoxifen Feb 2022 due to severe leg pain ------------------------------------------------------------------------------------------------------------------------------  Current treatment: Letrozole toxicities: Severe leg pains, switched to Tamoxifen (10/02/20) Tamoxifen Toxicities: Denies any problems further with the legs.  Dry cough: I suspect it is related to acid reflux.  She is currently on Nexium.  Breast cancer surveillance: [facility] mammogram 08/20/2020: Benign breast density category Bone density 02/28/2020: T score -1.3: Mild osteopenia recommended exercise and vitamin D  Return to clinic in 1 year for follow-up.  No orders of the defined types were placed in this encounter.  The patient has a good understanding of the overall plan. she agrees with it. she will call with any problems that may develop before the next visit here. Total time spent: 30 mins including face to face time and time spent for planning, charting and co-ordination of care   Harriette Ohara, MD 02/04/21

## 2021-02-13 ENCOUNTER — Encounter (INDEPENDENT_AMBULATORY_CARE_PROVIDER_SITE_OTHER): Payer: Self-pay

## 2021-02-23 ENCOUNTER — Encounter (INDEPENDENT_AMBULATORY_CARE_PROVIDER_SITE_OTHER): Payer: Self-pay

## 2021-03-10 ENCOUNTER — Encounter: Payer: Self-pay | Admitting: Hematology and Oncology

## 2021-03-23 ENCOUNTER — Encounter (INDEPENDENT_AMBULATORY_CARE_PROVIDER_SITE_OTHER): Payer: Self-pay

## 2021-04-13 ENCOUNTER — Encounter (INDEPENDENT_AMBULATORY_CARE_PROVIDER_SITE_OTHER): Payer: Self-pay

## 2021-05-18 ENCOUNTER — Encounter (INDEPENDENT_AMBULATORY_CARE_PROVIDER_SITE_OTHER): Payer: Self-pay

## 2021-07-20 ENCOUNTER — Encounter (INDEPENDENT_AMBULATORY_CARE_PROVIDER_SITE_OTHER): Payer: Self-pay

## 2021-08-24 ENCOUNTER — Encounter (INDEPENDENT_AMBULATORY_CARE_PROVIDER_SITE_OTHER): Payer: Self-pay

## 2021-09-20 HISTORY — PX: COLONOSCOPY WITH PROPOFOL: SHX5780

## 2021-11-25 ENCOUNTER — Other Ambulatory Visit: Payer: Self-pay | Admitting: Hematology and Oncology

## 2021-11-26 ENCOUNTER — Encounter: Payer: Self-pay | Admitting: Hematology and Oncology

## 2022-01-26 NOTE — Progress Notes (Incomplete)
? ?Patient Care Team: ?Carol Ada, MD as PCP - General (Family Medicine) ?Bensimhon, Shaune Pascal, MD as PCP - Advanced Heart Failure (Cardiology) ?Rockwell Germany, RN as Oncology Nurse Navigator ?Mauro Kaufmann, RN as Oncology Nurse Navigator ?Nicholas Lose, MD as Consulting Physician (Hematology and Oncology) ?Kyung Rudd, MD as Consulting Physician (Radiation Oncology) ?Stark Klein, MD as Consulting Physician (General Surgery) ?Kyung Rudd, MD as Consulting Physician (Radiation Oncology) ? ?DIAGNOSIS: No diagnosis found. ? ?SUMMARY OF ONCOLOGIC HISTORY: ?Oncology History  ?Malignant neoplasm of 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, estrogen receptor positive ([name])  ?08/17/2019 Initial Diagnosis  ? Routine screening mammogram detected a 0.8cm indeterminate mass in the right breast. Biopsy showed invasive ductal carcinoma, grade 2, HER-2 + by FISH, ER+ 100%, PR+ 70%, Ki67 15%.  ?  ?08/22/2019 Cancer Staging  ? Staging form: Breast, AJCC 8th Edition ?- Clinical stage from 08/22/2019: Stage IA (cT1b, cN0, cM0, G2, ER+, PR+, HER2+) - Signed by Nicholas Lose, MD on 08/22/2019 ? ?  ?09/07/2019 Surgery  ? Right lumpectomy [facility]): IDC with calcifications, grade 2, 1.5cm, clear margins, 2 lymph nodes negative.  ?  ?10/04/2019 -  Adjuvant Chemotherapy  ? Weekly Taxol and [name] x 12 completing on 12/20/2019, then maintenance [name] through the rest of 2021. ?  ?01/17/2020 - 02/12/2020 Radiation Therapy  ? Adjuvant radiation therapy ?  ?03/04/2020 -  Anti-estrogen oral therapy  ? Anastrozole switched to letrozole 08/20/2020 for muscle stiffness ?  ? ? ?CHIEF COMPLIANT: [name] maintenance ? ?INTERVAL HISTORY: Amy Harrington is a 71 y.o. with above-mentioned history of right breast cancer who underwent a lumpectomy, adjuvant chemotherapy, radiation, and is currently on [name] maintenance and antiestrogen therapy with anastrozole. She presents to the clinic today for treatment.  ? ? ?ALLERGIES:  has No Known  Allergies. ? ?MEDICATIONS:  ?Current Outpatient Medications  ?Medication Sig Dispense Refill  ? esomeprazole (NEXIUM) 20 MG capsule Take 20 mg by mouth daily at 12 noon.    ? Probiotic Product (PROBIOTIC ADVANCED PO) Take by mouth.    ? tamoxifen (NOLVADEX) 20 MG tablet TAKE 1 TABLET (20 MG TOTAL) BY MOUTH DAILY. 90 tablet 3  ? ?No current facility-administered medications for this visit.  ? ? ?PHYSICAL EXAMINATION: ?ECOG PERFORMANCE STATUS: {CHL ONC ECOG EZ:[id number]} ? ?There were no vitals filed for this visit. ?There were no vitals filed for this visit. ? ?BREAST:*** No palpable masses or nodules in either right or left breasts. No palpable axillary supraclavicular or infraclavicular adenopathy no breast tenderness or nipple discharge. (exam performed in the presence of a chaperone) ? ?LABORATORY DATA:  ?I have reviewed the data as listed ? ?  Latest Ref Rng & Units 08/07/2020  ?  9:19 AM 06/26/2020  ?  9:35 AM 05/15/2020  ?  8:29 AM  ?CMP  ?Glucose 70 - 99 mg/dL 87   85   97    ?BUN 8 - 23 mg/dL 17   16   14     ?Creatinine 0.44 - 1.00 mg/dL 0.83   0.79   0.84    ?Sodium 135 - 145 mmol/L 140   140   141    ?Potassium 3.5 - 5.1 mmol/L 4.2   4.2   4.2    ?Chloride 98 - 111 mmol/L 108   108   108    ?CO2 22 - 32 mmol/L 26   27   27     ?Calcium 8.9 - 10.3 mg/dL 9.4   9.5   9.9    ?  Total Protein 6.5 - 8.1 g/dL 6.7   6.4   6.4    ?Total Bilirubin 0.3 - 1.2 mg/dL 0.5   0.7   0.6    ?Alkaline Phos 38 - 126 U/L 64   62   66    ?AST 15 - 41 U/L 23   23   24     ?ALT 0 - 44 U/L 21   20   19     ? ? ?Lab Results  ?Component Value Date  ? WBC 6.5 08/07/2020  ? HGB 13.2 08/07/2020  ? HCT 39.8 08/07/2020  ? MCV 89.4 08/07/2020  ? PLT 175 08/07/2020  ? NEUTROABS 4.4 08/07/2020  ? ? ?ASSESSMENT & PLAN:  ?No problem-specific Assessment & Plan notes found for this encounter. ? ? ? ?No orders of the defined types were placed in this encounter. ? ?The patient has a good understanding of the overall plan. she agrees with it. she will  call with any problems that may develop before the next visit here. ?Total time spent: 30 mins including face to face time and time spent for planning, charting and co-ordination of care ? ? Suzzette Righter, CMA ?01/26/22 ? ? ? I Amy Harrington, Amy Harrington am scribing for Dr. Lindi Adie ? ?***  ?

## 2022-02-03 ENCOUNTER — Encounter: Payer: Self-pay | Admitting: Hematology and Oncology

## 2022-02-04 ENCOUNTER — Inpatient Hospital Stay: Payer: Medicare Other | Admitting: Hematology and Oncology

## 2022-02-10 ENCOUNTER — Inpatient Hospital Stay: Payer: Medicare Other | Attending: Hematology and Oncology | Admitting: Hematology and Oncology

## 2022-02-10 ENCOUNTER — Other Ambulatory Visit: Payer: Self-pay

## 2022-02-10 DIAGNOSIS — Z79810 Long term (current) use of selective estrogen receptor modulators (SERMs): Secondary | ICD-10-CM | POA: Insufficient documentation

## 2022-02-10 DIAGNOSIS — Z170 Estrogen receptor positive status [ER+]: Secondary | ICD-10-CM | POA: Insufficient documentation

## 2022-02-10 DIAGNOSIS — C50211 Malignant neoplasm of upper-inner quadrant of right female breast: Secondary | ICD-10-CM | POA: Insufficient documentation

## 2022-02-10 DIAGNOSIS — R5383 Other fatigue: Secondary | ICD-10-CM | POA: Diagnosis not present

## 2022-02-10 NOTE — Progress Notes (Signed)
Patient Care Team: Carol Ada, MD as PCP - General (Family Medicine) Bensimhon, Shaune Pascal, MD as PCP - Advanced Heart Failure (Cardiology) Rockwell Germany, RN as Oncology Nurse Navigator Mauro Kaufmann, RN as Oncology Nurse Navigator Nicholas Lose, MD as Consulting Physician (Hematology and Oncology) Kyung Rudd, MD as Consulting Physician (Radiation Oncology) Stark Klein, MD as Consulting Physician (General Surgery) Kyung Rudd, MD as Consulting Physician (Radiation Oncology)  DIAGNOSIS:  Encounter Diagnosis  Name Primary?   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 Biopsy showed invasive ductal carcinoma, grade 2, HER-2 + by FISH, ER+ 100%, PR+ 70%, Ki67 15%.    08/22/2019 Cancer Staging   Staging form: Breast, AJCC 8th Edition - Clinical stage from 08/22/2019: Stage IA (cT1b, cN0, cM0, G2, ER+, PR+, HER2+) - Signed by Nicholas Lose, MD on 08/22/2019    09/07/2019 Surgery   Right lumpectomy [facility]): IDC with calcifications, grade 2, 1.5cm, clear margins, 2 lymph nodes negative.    10/04/2019 -  Adjuvant Chemotherapy   Weekly Taxol and [name] x 12 completing on 12/20/2019, then maintenance [name] through the rest of 2021.   01/17/2020 - 02/12/2020 Radiation Therapy   Adjuvant radiation therapy   03/04/2020 -  Anti-estrogen oral therapy   Anastrozole switched to letrozole 08/20/2020 for muscle stiffness     CHIEF COMPLIANT:  Follow-up on tamoxifen  INTERVAL HISTORY: Amy Harrington is a 71 year old with above-mentioned history right breast cancer who is currently on tamoxifen. She presents to the clinic today for a follow-up. Denies hot  flashes and pain and discomfort in breast. She states that she does feel tired.   ALLERGIES:  is allergic to anastrozole and letrozole.  MEDICATIONS:  Current Outpatient Medications  Medication Sig Dispense Refill   esomeprazole (NEXIUM) 20 MG capsule Take 20 mg by mouth daily at 12 noon.     Probiotic Product (PROBIOTIC ADVANCED PO) Take by mouth.     tamoxifen (NOLVADEX) 20 MG tablet TAKE 1 TABLET (20 MG TOTAL) BY MOUTH DAILY. 90 tablet 3   No current facility-administered medications for this visit.    PHYSICAL EXAMINATION: ECOG PERFORMANCE STATUS: 1 - Symptomatic but completely ambulatory  Vitals:   02/10/22 1428  BP: (!) 153/57  Pulse: 84  Resp: 18  Temp: (!) 97.5 F (36.4 C)  SpO2: 98%   Filed Weights   02/10/22 1428  Weight: 185 lb (83.9 kg)    BREAST: No palpable masses or nodules in either right or left breasts. No palpable axillary supraclavicular or infraclavicular adenopathy no breast tenderness or nipple discharge. (exam performed in the presence of a chaperone)  LABORATORY DATA:  I have reviewed the data as listed    Latest Ref Rng & Units 08/07/2020    9:19 AM 06/26/2020    9:35 AM 05/15/2020    8:29 AM  CMP  Glucose 70 - 99 mg/dL 87   85   97    BUN 8 - 23 mg/dL 17   16   14     Creatinine 0.44 - 1.00 mg/dL 0.83   0.79   0.84    Sodium 135 - 145 mmol/L 140   140   141    Potassium 3.5 -  5.1 mmol/L 4.2   4.2   4.2    Chloride 98 - 111 mmol/L 108   108   108    CO2 22 - 32 mmol/L 26   27   27     Calcium 8.9 - 10.3 mg/dL 9.4   9.5   9.9    Total Protein 6.5 - 8.1 g/dL 6.7   6.4   6.4    Total Bilirubin 0.3 - 1.2 mg/dL 0.5   0.7   0.6    Alkaline Phos 38 - 126 U/L 64   62   66    AST 15 - 41 U/L 23   23   24     ALT 0 - 44 U/L 21   20   19       Lab Results  Component Value Date   WBC 6.5 08/07/2020   HGB 13.2 08/07/2020   HCT 39.8 08/07/2020   MCV 89.4 08/07/2020   PLT 175 08/07/2020   NEUTROABS 4.4 08/07/2020    ASSESSMENT & PLAN:   Malignant neoplasm of 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, estrogen receptor positive ([place]) 09/07/2019:Right lumpectomy ([name]): IDC with calcifications, grade 2, 1.5cm, clear margins, 2 lymph nodes negative.  ER 100%, PR 70%, HER-2 positive, Ki-67 15% T1c N0 stage Ia   Treatment plan: 1. Adjuvant chemotherapy with Taxol [name] weekly x12 completed 12/20/2019 followed by [name] maintenance for 1 year completed December 2021 2.  Adjuvant radiation completed 01/2024 3.  Follow-up adjuvant antiestrogen therapy initially treated with anastrozole started June 2021 switched to letrozole November 2021 switched to Tamoxifen Feb 2022 due to severe leg pain ------------------------------------------------------------------------------------------------------------------------------  Current treatment: Letrozole toxicities: Severe leg pains, switched to Tamoxifen (10/02/20) Tamoxifen Toxicities: Denies any problems further with the legs.   Dry cough: Doing better on Nexium.   Breast cancer surveillance:  1. [facility] mammogram 08/24/2021: Benign breast density category A 2.  Breast exam 02/10/2022: Benign Bone density 02/28/2020: T score -1.3: Mild osteopenia recommended exercise and vitamin D   She stays fairly active and walks up to 10,000 steps a day.  Return to clinic in 1 year for follow-up.    No orders of the defined types were placed in this encounter.  The patient has a good understanding of the overall plan. she agrees with it. she will call with any problems that may develop before the next visit here. Total time spent: 30 mins including face to face time and time spent for planning, charting and co-ordination of care   Harriette Ohara, MD 02/10/22    I Gardiner Coins am scribing for Dr. Lindi Adie  I have reviewed the above documentation for accuracy and completeness, and I agree with the above.

## 2022-02-10 NOTE — Assessment & Plan Note (Signed)
09/07/2019:Right lumpectomy [facility]): IDC with calcifications, grade 2, 1.5cm, clear margins, 2 lymph nodes negative.ER 100%, PR 70%, HER-2 positive, Ki-67 15% T1c N0 stage Ia  Treatment plan: 1.Adjuvant chemotherapy with Taxol [name] weekly x12 completed 12/20/2019 followed by [name] maintenance for 1 yearcompleted December 2021 2.Adjuvant radiationcompleted 01/2024 3.Follow-up adjuvant antiestrogen therapyinitially treated with anastrozole started June 2021 switched to letrozole November 2021 switched to Tamoxifen Feb 2022 due to severelegpain ------------------------------------------------------------------------------------------------------------------------------ Current treatment: Letrozole toxicities:Severe leg pains, switched to Tamoxifen(10/02/20) Tamoxifen Toxicities: Denies any problems further with the legs.  Dry cough: I suspect it is related to acid reflux.  She is currently on Nexium.  Breast cancer surveillance:  1. [facility] mammogram 08/24/2021: Benign breast density category A 2.  Breast exam 02/10/2022: Benign Bone density 02/28/2020: T score -1.3: Mild osteopenia recommended exercise and vitamin D  Return to clinic in 1 year for follow-up.

## 2022-02-11 ENCOUNTER — Telehealth: Payer: Self-pay | Admitting: Hematology and Oncology

## 2022-02-11 NOTE — Telephone Encounter (Signed)
Scheduled appointment per 5/24 los. Patient is aware.

## 2022-02-16 ENCOUNTER — Ambulatory Visit (INDEPENDENT_AMBULATORY_CARE_PROVIDER_SITE_OTHER): Payer: Medicare Other | Admitting: Podiatry

## 2022-02-16 DIAGNOSIS — L601 Onycholysis: Secondary | ICD-10-CM

## 2022-02-16 DIAGNOSIS — M79675 Pain in left toe(s): Secondary | ICD-10-CM | POA: Diagnosis not present

## 2022-02-16 NOTE — Patient Instructions (Signed)

## 2022-02-24 NOTE — Progress Notes (Signed)
Subjective:   Patient ID: Amy Harrington, female   DOB: 71 y.o.   MRN: [id number]   HPI 71 year old female presents to the office today for concerns of her left big toenail partially coming off and thinks the nail needs to be removed.  This started 6 years ago after she dropped a nail on the toenail and it did not grow back to normal.  He states that she had to begin 2 weeks ago on the bed and the nail started to lift and has been sore and had some bleeding.  She states that he tried taking it off but it was hurting.  No purulence noted.   Review of Systems  All other systems reviewed and are negative.  Past Medical History:  Diagnosis Date   GERD (gastroesophageal reflux disease)    Plantar fasciitis    PONV (postoperative nausea and vomiting)     Past Surgical History:  Procedure Laterality Date   BREAST LUMPECTOMY WITH RADIOACTIVE SEED AND SENTINEL LYMPH NODE BIOPSY Right 09/07/2019   Procedure: RIGHT BREAST LUMPECTOMY WITH RADIOACTIVE SEED AND SENTINEL LYMPH NODE BIOPSY;  Surgeon: Stark Klein, MD;  Location: [facility];  Service: General;  Laterality: Right;   HERNIA REPAIR     PORT-A-CATH REMOVAL N/A 09/16/2020   Procedure: REMOVAL PORT-A-CATH;  Surgeon: Stark Klein, MD;  Location: [place];  Service: General;  Laterality: N/A;   PORTACATH PLACEMENT Left 09/07/2019   Procedure: INSERTION PORT-A-CATH WITH ULTRASOUND;  Surgeon: Stark Klein, MD;  Location: [place];  Service: General;  Laterality: Left;     Current Outpatient Medications:    esomeprazole (NEXIUM) 20 MG capsule, Take 20 mg by mouth daily at 12 noon., Disp: , Rfl:    Probiotic Product (PROBIOTIC ADVANCED PO), Take by mouth., Disp: , Rfl:    tamoxifen (NOLVADEX) 20 MG tablet, TAKE 1 TABLET (20 MG TOTAL) BY MOUTH DAILY., Disp: 90 tablet, Rfl: 3  Allergies  Allergen Reactions   Anastrozole Other (See Comments)   Letrozole Other (See Comments)          Objective:  Physical Exam  General: AAO x3, NAD  Dermatological: Left hallux has dystrophic and is loosening underlying nailbed and only attached on the proximal nail fold.  No active bleeding.  No drainage or pus.  Minimal edema.  No erythema, cellulitis.  Vascular: Dorsalis Pedis artery and Posterior Tibial artery pedal pulses are 2/4 bilateral with immedate capillary fill time. There is no pain with calf compression, swelling, warmth, erythema.   Neruologic: Grossly intact via light touch bilateral.   Musculoskeletal: Tenderness left hallux toenail.  No other areas of discomfort.  Muscular strength 5/5 in all groups tested bilateral.  Gait: Unassisted, Nonantalgic.       Assessment:   71 year old female with left hallux onycholysis     Plan:  -Treatment options discussed including all alternatives, risks, and complications -Etiology of symptoms were discussed -At this time, recommended total nail removal due to loosening of the nail.  Risks and complications were discussed with the patient for which they understand and  verbally consent to the procedure. Under sterile conditions a total of 3 mL of a mixture of 2% lidocaine plain and 0.5% Marcaine plain was infiltrated in a hallux block fashion. Once anesthetized, the skin was prepped in sterile fashion. A tourniquet was then applied. Next the left hallux nail border was removed in total making sure remove all nail borders. Once the nail was removed, the area  was debrided and the underlying skin was intact.  No lacerations noted.  The area was irrigated and hemostasis was obtained.  A dry sterile dressing was applied. After application of the dressing the tourniquet was removed and there is found to be an immediate capillary refill time to the digit. The patient tolerated the procedure well any complications. Post procedure instructions were discussed the patient for which he verbally understood.Discussed signs/symptoms of worsening  infection and directed to call the office immediately should any occur or go directly to the emergency room. In the meantime, encouraged to call the office with any questions, concerns, changes symptoms.  Trula Slade DPM

## 2022-04-12 ENCOUNTER — Other Ambulatory Visit: Payer: Self-pay

## 2022-04-19 ENCOUNTER — Encounter (INDEPENDENT_AMBULATORY_CARE_PROVIDER_SITE_OTHER): Payer: Self-pay

## 2022-04-20 ENCOUNTER — Other Ambulatory Visit: Payer: Self-pay

## 2022-05-17 ENCOUNTER — Encounter (INDEPENDENT_AMBULATORY_CARE_PROVIDER_SITE_OTHER): Payer: Self-pay

## 2022-06-21 ENCOUNTER — Encounter (INDEPENDENT_AMBULATORY_CARE_PROVIDER_SITE_OTHER): Payer: Self-pay

## 2022-07-05 ENCOUNTER — Encounter (INDEPENDENT_AMBULATORY_CARE_PROVIDER_SITE_OTHER): Payer: Self-pay

## 2022-08-07 ENCOUNTER — Encounter (INDEPENDENT_AMBULATORY_CARE_PROVIDER_SITE_OTHER): Payer: Self-pay

## 2022-08-23 ENCOUNTER — Encounter (INDEPENDENT_AMBULATORY_CARE_PROVIDER_SITE_OTHER): Payer: Self-pay

## 2022-08-30 ENCOUNTER — Encounter (INDEPENDENT_AMBULATORY_CARE_PROVIDER_SITE_OTHER): Payer: Self-pay

## 2022-09-27 ENCOUNTER — Encounter (INDEPENDENT_AMBULATORY_CARE_PROVIDER_SITE_OTHER): Payer: Self-pay

## 2022-11-08 ENCOUNTER — Encounter (INDEPENDENT_AMBULATORY_CARE_PROVIDER_SITE_OTHER): Payer: Self-pay

## 2022-11-15 ENCOUNTER — Encounter (INDEPENDENT_AMBULATORY_CARE_PROVIDER_SITE_OTHER): Payer: Self-pay

## 2022-11-18 ENCOUNTER — Encounter: Payer: Self-pay | Admitting: Hematology and Oncology

## 2022-11-18 ENCOUNTER — Encounter: Payer: Self-pay | Admitting: *Deleted

## 2022-11-18 NOTE — Progress Notes (Signed)
Symptom Management Consult Note [place]    Patient Care Team: Carol Ada, MD as PCP - General (Family Medicine) Bensimhon, Shaune Pascal, MD as PCP - Advanced Heart Failure (Cardiology) Rockwell Germany, RN as Oncology Nurse Navigator Mauro Kaufmann, RN as Oncology Nurse Navigator Nicholas Lose, MD as Consulting Physician (Hematology and Oncology) Kyung Rudd, MD as Consulting Physician (Radiation Oncology) Stark Klein, MD as Consulting Physician (General Surgery) Kyung Rudd, MD as Consulting Physician (Radiation Oncology)    Name / MRN / DOB: Amy Harrington  [id number]  1951/02/06   Date of visit: 11/19/2022   Chief Complaint/Reason for visit: leg pain   Current Therapy: Tamoxifen since February 2022    ASSESSMENT & PLAN: Patient is a 72 y.o. female  with oncologic history of malignant neoplasm of 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, estrogen receptor positive followed by Dr. Lindi Adie.  I have viewed most recent oncology note and lab work.    #Malignant neoplasm of 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, estrogen receptor positive  - Next appointment with oncologist is 12/02/22   #Leg pain - Chart review shows patient has struggled with leg pain while on antiestrogens in the past. She started on anastrozole June 2021 then had to change to letrozole in November 2021 then changed to to Tamoxifen Feb 2022. The changes were all due to severe leg pain  -On exam today patient is well-appearing.  She has tenderness to deep palpation of her right hip.  She is ambulatory with normal gait and neurovascularly intact. -Because her leg pain is waking her up from sleep I am concerned for metastatic disease. Patient agreeable to get xray of right hip. DVT studied obtained prior to clinic visit is negative.  - Xray viewed by me shows possible lucent lesions within the right basicervical femoral neck, ischial tuberosity, and right inferior pubic ramus. I  agree with radiologist impression. -I called patient to discuss x-ray results.  She will need further imaging with MRI to rule out metastatic disease.  MRI has been ordered, waiting on prior authorization then it will be scheduled. -Patient will continue tylenol for pain management.   Strict ED precautions discussed should symptoms worsen.     Heme/Onc History: Oncology History  Malignant neoplasm of 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, estrogen receptor positive ([place])  08/17/2019 Initial Diagnosis   Routine screening mammogram detected a 0.8cm indeterminate mass in the right breast. Biopsy showed invasive ductal carcinoma, grade 2, HER-2 + by FISH, ER+ 100%, PR+ 70%, Ki67 15%.    08/22/2019 Cancer Staging   Staging form: Breast, AJCC 8th Edition - Clinical stage from 08/22/2019: Stage IA (cT1b, cN0, cM0, G2, ER+, PR+, HER2+) - Signed by Nicholas Lose, MD on 08/22/2019   09/07/2019 Surgery   Right lumpectomy [facility]): IDC with calcifications, grade 2, 1.5cm, clear margins, 2 lymph nodes negative.    10/04/2019 -  Adjuvant Chemotherapy   Weekly Taxol and [name] x 12 completing on 12/20/2019, then maintenance [name] through the rest of 2021.   01/17/2020 - 02/12/2020 Radiation Therapy   Adjuvant radiation therapy   03/04/2020 -  Anti-estrogen oral therapy   Anastrozole switched to letrozole 08/20/2020 for muscle stiffness       Interval history-: Amy Harrington is a 72 y.o. female with oncologic history as above presenting to [facility] today with chief complaint of leg pain x 5 weeks.  Patient presents unaccompanied to clinic.  Patient states she has pain  in both of her legs however the pain is much worse in the right leg.  Pain is located in her hip and radiates down the outside of her leg.  She describes the pain as a soreness.  She states the pain is worse with movement or after standing up from sitting for prolonged period of time.  She rates the pain 7 out of 10 in severity with  movement.  She denies any pain currently.  She has tried taking Tylenol and Aleve for the pain.  She thinks Aleve gave more than the Tylenol.  She states the pain has been worsening over the last 2 weeks however has become bothersome overnight in the last week.  She has difficulty getting comfortable in bed and also notes that the pain wakes her up from sleep frequently.  She denies any fall or injury.  She denies any swelling, redness, numbness or tingling to the leg.  She admits to having leg pain with other antiestrogen treatments in the past.  She thinks this might be similar however is slightly more severe than it has been in the past.  Patient denies any fever or chills.    ROS  All other systems are reviewed and are negative for acute change except as noted in the HPI.    Allergies  Allergen Reactions   Anastrozole Other (See Comments)   Letrozole Other (See Comments)     Past Medical History:  Diagnosis Date   GERD (gastroesophageal reflux disease)    Plantar fasciitis    PONV (postoperative nausea and vomiting)      Past Surgical History:  Procedure Laterality Date   BREAST LUMPECTOMY WITH RADIOACTIVE SEED AND SENTINEL LYMPH NODE BIOPSY Right 09/07/2019   Procedure: RIGHT BREAST LUMPECTOMY WITH RADIOACTIVE SEED AND SENTINEL LYMPH NODE BIOPSY;  Surgeon: Stark Klein, MD;  Location: [place];  Service: General;  Laterality: Right;   HERNIA REPAIR     PORT-A-CATH REMOVAL N/A 09/16/2020   Procedure: REMOVAL PORT-A-CATH;  Surgeon: Stark Klein, MD;  Location: [place];  Service: General;  Laterality: N/A;   PORTACATH PLACEMENT Left 09/07/2019   Procedure: INSERTION PORT-A-CATH WITH ULTRASOUND;  Surgeon: Stark Klein, MD;  Location: [place];  Service: General;  Laterality: Left;    Social History   Socioeconomic History   Marital status: Widowed    Spouse name: Not on file   Number of children: Not on file   Years of  education: Not on file   Highest education level: Not on file  Occupational History   Not on file  Tobacco Use   Smoking status: Never   Smokeless tobacco: Never  Vaping Use   Vaping Use: Never used  Substance and Sexual Activity   Alcohol use: Not Currently   Drug use: Never   Sexual activity: Not on file  Other Topics Concern   Not on file  Social History Narrative   Not on file   Social Determinants of Health   Financial Resource Strain: Not on file  Food Insecurity: Not on file  Transportation Needs: Not on file  Physical Activity: Not on file  Stress: Not on file  Social Connections: Not on file  Intimate Partner Violence: Not on file    Family History  Problem Relation Age of Onset   Stomach cancer Father 33     Current Outpatient Medications:    esomeprazole (NEXIUM) 20 MG capsule, Take 20 mg by mouth daily at 12 noon., Disp: ,  Rfl:    Probiotic Product (PROBIOTIC ADVANCED PO), Take by mouth., Disp: , Rfl:    tamoxifen (NOLVADEX) 20 MG tablet, TAKE 1 TABLET (20 MG TOTAL) BY MOUTH DAILY., Disp: 90 tablet, Rfl: 3  PHYSICAL EXAM: ECOG FS:1 - Symptomatic but completely ambulatory    Vitals:   11/19/22 1203  BP: (!) 110/48  Pulse: 79  Resp: 17  Temp: 98.4 F (36.9 C)  TempSrc: Oral  SpO2: 99%  Weight: 186 lb (84.4 kg)   Physical Exam Vitals and nursing note reviewed.  Constitutional:      Appearance: She is well-developed. She is not ill-appearing or toxic-appearing.  HENT:     Head: Normocephalic.     Nose: Nose normal.  Eyes:     Conjunctiva/sclera: Conjunctivae normal.  Neck:     Vascular: No JVD.  Cardiovascular:     Rate and Rhythm: Normal rate and regular rhythm.     Pulses: Normal pulses.          Dorsalis pedis pulses are 2+ on the right side and 2+ on the left side.     Heart sounds: Normal heart sounds.  Pulmonary:     Effort: Pulmonary effort is normal.     Breath sounds: Normal breath sounds.  Abdominal:     General: There is no  distension.  Musculoskeletal:     Cervical back: Normal range of motion.     Right lower leg: No edema.     Left lower leg: No edema.     Comments: Compartments in bilateral lower extremities are soft.  No calf tenderness.  Bony tenderness to deep palpation of right hip.  No overlying skin changes.  Ambulatory with normal gait.  Skin:    General: Skin is warm and dry.  Neurological:     Mental Status: She is oriented to person, place, and time.     Comments: Sensation grossly intact to light touch in the lower extremities bilaterally. No saddle anesthesias. Strength 5/5 with flexion and extension at the bilateral hips, knees, and ankles. No noted gait deficit. Coordination intact with heel to shin testing.        LABORATORY DATA: I have reviewed the data as listed    Latest Ref Rng & Units 08/07/2020    9:19 AM 06/26/2020    9:35 AM 05/15/2020    8:29 AM  CBC  WBC 4.0 - 10.5 K/uL 6.5  4.6  5.3   Hemoglobin 12.0 - 15.0 g/dL 13.2  12.9  13.2   Hematocrit 36.0 - 46.0 % 39.8  39.3  40.6   Platelets 150 - 400 K/uL 175  178  189         Latest Ref Rng & Units 08/07/2020    9:19 AM 06/26/2020    9:35 AM 05/15/2020    8:29 AM  CMP  Glucose 70 - 99 mg/dL 87  85  97   BUN 8 - 23 mg/dL '17  16  14   '$ Creatinine 0.44 - 1.00 mg/dL 0.83  0.79  0.84   Sodium 135 - 145 mmol/L 140  140  141   Potassium 3.5 - 5.1 mmol/L 4.2  4.2  4.2   Chloride 98 - 111 mmol/L 108  108  108   CO2 22 - 32 mmol/L '26  27  27   '$ Calcium 8.9 - 10.3 mg/dL 9.4  9.5  9.9   Total Protein 6.5 - 8.1 g/dL 6.7  6.4  6.4   Total Bilirubin 0.3 -  1.2 mg/dL 0.5  0.7  0.6   Alkaline Phos 38 - 126 U/L 64  62  66   AST 15 - 41 U/L '23  23  24   '$ ALT 0 - 44 U/L '21  20  19        '$ RADIOGRAPHIC STUDIES (from last 24 hours if applicable) I have personally reviewed the radiological images as listed and agreed with the findings in the report. DG HIP UNILAT WITH PELVIS 1V RIGHT  Result Date: 11/19/2022 CLINICAL DATA:  right  hip pain, with hx breast cancer EXAM: DG HIP (WITH OR WITHOUT PELVIS) 1V RIGHT COMPARISON:  None Available. FINDINGS: There is no evidence of acute fracture. 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 There are possible lucent lesions in the basicervical right femoral neck, right ischial tuberosity, and right inferior pubic ramus. IMPRESSION: Possible lucent lesions within the right basicervical femoral neck, ischial tuberosity, and right inferior pubic ramus. Recommend CT or MRI of the pelvis. No evidence of acute fracture. Electronically Signed   By: Maurine Simmering M.D.   On: 11/19/2022 13:44   VAS [place] LOWER EXTREMITY VENOUS (DVT)  Result Date: 11/19/2022  Lower Venous DVT Study Patient Name:  Amy Harrington  Date of Exam:   11/19/2022 Medical Rec #: [id number]     Accession #:    [id number] Date of Birth: 1951/08/18     Patient Gender: F Patient Age:   40 years Exam Location:  [facility] Procedure:      VAS [place] LOWER EXTREMITY VENOUS (DVT) Referring Phys: Sherol Dade --------------------------------------------------------------------------------  Indications: Pain.  Risk Factors: Cancer. Comparison Study: No prior studies. Performing Technologist: Oliver Hum RVT  Examination Guidelines: A complete evaluation includes B-mode imaging, spectral Doppler, color Doppler, and power Doppler as needed of all accessible portions of each vessel. Bilateral testing is considered an integral part of a complete examination. Limited examinations for reoccurring indications may be performed as noted. The reflux portion of the exam is performed with the patient in reverse Trendelenburg.  +---------+---------------+---------+-----------+----------+--------------+ RIGHT    CompressibilityPhasicitySpontaneityPropertiesThrombus Aging +---------+---------------+---------+-----------+----------+--------------+ CFV      Full           Yes      Yes                                  +---------+---------------+---------+-----------+----------+--------------+ SFJ      Full                                                        +---------+---------------+---------+-----------+----------+--------------+ FV Prox  Full                                                        +---------+---------------+---------+-----------+----------+--------------+ FV Mid   Full                                                        +---------+---------------+---------+-----------+----------+--------------+  FV DistalFull                                                        +---------+---------------+---------+-----------+----------+--------------+ PFV      Full                                                        +---------+---------------+---------+-----------+----------+--------------+ POP      Full           Yes      Yes                                 +---------+---------------+---------+-----------+----------+--------------+ PTV      Full                                                        +---------+---------------+---------+-----------+----------+--------------+ PERO     Full                                                        +---------+---------------+---------+-----------+----------+--------------+   +----+---------------+---------+-----------+----------+--------------+ LEFTCompressibilityPhasicitySpontaneityPropertiesThrombus Aging +----+---------------+---------+-----------+----------+--------------+ CFV Full           Yes      Yes                                 +----+---------------+---------+-----------+----------+--------------+     Summary: RIGHT: - There is no evidence of deep vein thrombosis in the lower extremity.  - No cystic structure found in the popliteal fossa.  LEFT: - No evidence of common femoral vein obstruction.  *See table(s) above for measurements and observations. Electronically signed by Monica Martinez  MD on 11/19/2022 at 12:42:17 PM.    Final         Visit Diagnosis: 1. Right leg pain   2. Bone lesion   3. Malignant neoplasm of 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, estrogen receptor positive ([place])      Orders Placed This Encounter  Procedures   DG HIP UNILAT WITH PELVIS 1V RIGHT    Standing Status:   Future    Number of Occurrences:   1    Standing Expiration Date:   11/19/2023    Order Specific Question:   Reason for Exam (SYMPTOM  OR DIAGNOSIS REQUIRED)    Answer:   right hip pain, with hx breast cancer    Order Specific Question:   Preferred imaging location?    Answer:   [facility]   MR Pelvis W Wo Contrast    Standing Status:   Future    Standing Expiration Date:   11/19/2023    Order Specific Question:   If indicated for the ordered procedure, I authorize the administration of contrast media  per Radiology protocol    Answer:   Yes    Order Specific Question:   What is the patient's sedation requirement?    Answer:   No Sedation    Order Specific Question:   Does the patient have a pacemaker or implanted devices?    Answer:   No    Order Specific Question:   Preferred imaging location?    Answer:   [facility] (table limit - 550 lbs)    All questions were answered. The patient knows to call the clinic with any problems, questions or concerns. No barriers to learning was detected.  I have spent a total of 20 minutes minutes of face-to-face and non-face-to-face time, preparing to see the patient, obtaining and/or reviewing separately obtained history, performing a medically appropriate examination, counseling and educating the patient, ordering tests, documenting clinical information in the electronic health record.  Thank you for allowing me to participate in the care of this patient.    Barrie Folk, PA-C Department of Hematology/Oncology [facility] Phone: [phone]  [phone]     11/19/2022 3:18 PM

## 2022-11-18 NOTE — Progress Notes (Signed)
Received mychart message from pt with complaint of joint pain and muscle pain x 2-3 weeks while on Tamoxifen.  Pt states the pain in primarily in right leg from hip to foot and causes trouble with sleeping due to the pain.  Per MD pt needing to be seen my [facility] for further evaluation.  Appt scheduled, pt verbalized understanding of appt date and time.

## 2022-11-19 ENCOUNTER — Encounter: Payer: Self-pay | Admitting: Hematology and Oncology

## 2022-11-19 ENCOUNTER — Ambulatory Visit (HOSPITAL_COMMUNITY)
Admission: RE | Admit: 2022-11-19 | Discharge: 2022-11-19 | Disposition: A | Discharge status: Home / Self Care | Payer: Medicare Other | Source: Ambulatory Visit | Attending: Physician Assistant | Admitting: Physician Assistant

## 2022-11-19 ENCOUNTER — Telehealth: Payer: Self-pay

## 2022-11-19 ENCOUNTER — Inpatient Hospital Stay: Payer: Medicare Other | Attending: Physician Assistant | Admitting: Physician Assistant

## 2022-11-19 ENCOUNTER — Ambulatory Visit (HOSPITAL_COMMUNITY): Payer: Medicare Other

## 2022-11-19 ENCOUNTER — Other Ambulatory Visit: Payer: Self-pay | Admitting: Physician Assistant

## 2022-11-19 VITALS — BP 110/48 | HR 79 | Temp 98.4°F | Resp 17 | Wt 186.0 lb

## 2022-11-19 DIAGNOSIS — M79604 Pain in right leg: Secondary | ICD-10-CM

## 2022-11-19 DIAGNOSIS — C50211 Malignant neoplasm of upper-inner quadrant of right female breast: Secondary | ICD-10-CM | POA: Diagnosis not present

## 2022-11-19 DIAGNOSIS — M899 Disorder of bone, unspecified: Secondary | ICD-10-CM

## 2022-11-19 DIAGNOSIS — Z79810 Long term (current) use of selective estrogen receptor modulators (SERMs): Secondary | ICD-10-CM | POA: Diagnosis not present

## 2022-11-19 DIAGNOSIS — Z170 Estrogen receptor positive status [ER+]: Secondary | ICD-10-CM | POA: Diagnosis not present

## 2022-11-19 NOTE — Progress Notes (Signed)
Right lower extremity venous duplex has been completed. Preliminary results can be found in CV Proc through chart review.  Results were given to [facility] PA.  11/19/22 11:30 AM Amy Harrington RVT

## 2022-11-19 NOTE — Telephone Encounter (Signed)
RN called patient to inform her of scheduled appointment for DVT study. Patient verbalized understanding of time and location of appointment.

## 2022-11-23 ENCOUNTER — Ambulatory Visit (HOSPITAL_COMMUNITY)
Admission: RE | Admit: 2022-11-23 | Discharge: 2022-11-23 | Disposition: A | Discharge status: Home / Self Care | Payer: Medicare Other | Source: Ambulatory Visit | Attending: Physician Assistant | Admitting: Physician Assistant

## 2022-11-23 DIAGNOSIS — Z170 Estrogen receptor positive status [ER+]: Secondary | ICD-10-CM | POA: Insufficient documentation

## 2022-11-23 DIAGNOSIS — M899 Disorder of bone, unspecified: Secondary | ICD-10-CM

## 2022-11-23 DIAGNOSIS — C50211 Malignant neoplasm of upper-inner quadrant of right female breast: Secondary | ICD-10-CM | POA: Diagnosis present

## 2022-11-23 MED ORDER — GADOBUTROL 1 MMOL/ML IV SOLN
8.0000 mL | Freq: Once | INTRAVENOUS | Status: AC | PRN
  Administered 2022-11-23: 8 mL via INTRAVENOUS

## 2022-11-24 ENCOUNTER — Telehealth: Payer: Self-pay

## 2022-11-24 NOTE — Telephone Encounter (Signed)
This RN called patient per request of Sherol Dade, PA-C regarding recent MRI results. 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 Patient was advised to follow up with orthopedics for this and confirmed that she will do so. No further questions or concerns at this time.

## 2022-11-26 ENCOUNTER — Other Ambulatory Visit: Payer: Self-pay | Admitting: Hematology and Oncology

## 2022-12-02 ENCOUNTER — Encounter: Payer: Self-pay | Admitting: Adult Health

## 2022-12-02 ENCOUNTER — Inpatient Hospital Stay (HOSPITAL_BASED_OUTPATIENT_CLINIC_OR_DEPARTMENT_OTHER): Payer: Medicare Other | Admitting: Adult Health

## 2022-12-02 VITALS — BP 141/64 | HR 71 | Temp 97.3°F | Resp 18 | Wt 184.8 lb

## 2022-12-02 DIAGNOSIS — C50211 Malignant neoplasm of upper-inner quadrant of right female breast: Secondary | ICD-10-CM | POA: Diagnosis not present

## 2022-12-02 DIAGNOSIS — M6780 Other specified disorders of synovium and tendon, unspecified site: Secondary | ICD-10-CM

## 2022-12-02 DIAGNOSIS — Z170 Estrogen receptor positive status [ER+]: Secondary | ICD-10-CM | POA: Diagnosis not present

## 2022-12-02 MED ORDER — FLUCONAZOLE 150 MG PO TABS
150.0000 mg | ORAL_TABLET | Freq: Every day | ORAL | 0 refills | Status: DC
Start: 2022-12-02 — End: 2023-01-20

## 2022-12-02 NOTE — Progress Notes (Signed)
[facility] Follow up:    Amy Ada, MD [address]  [place] [address]   DIAGNOSIS:  Cancer Staging  Malignant neoplasm of 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, estrogen receptor positive ([place]) Staging form: Breast, AJCC 8th Edition - Clinical stage from 08/22/2019: Stage IA (cT1b, cN0, cM0, G2, ER+, PR+, HER2+) - Signed by Nicholas Lose, MD on 08/22/2019 Stage prefix: Initial diagnosis Histologic grading system: 3 grade system   SUMMARY OF ONCOLOGIC HISTORY: Oncology History  Malignant neoplasm of 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, estrogen receptor positive ([place])  08/17/2019 Initial Diagnosis   Routine screening mammogram detected a 0.8cm indeterminate mass in the right breast. Biopsy showed invasive ductal carcinoma, grade 2, HER-2 + by FISH, ER+ 100%, PR+ 70%, Ki67 15%.    08/22/2019 Cancer Staging   Staging form: Breast, AJCC 8th Edition - Clinical stage from 08/22/2019: Stage IA (cT1b, cN0, cM0, G2, ER+, PR+, HER2+) - Signed by Nicholas Lose, MD on 08/22/2019   09/07/2019 Surgery   Right lumpectomy [facility]): IDC with calcifications, grade 2, 1.5cm, clear margins, 2 lymph nodes negative.    10/04/2019 -  Adjuvant Chemotherapy   Weekly Taxol and [name] x 12 completing on 12/20/2019, then maintenance [name] through the rest of 2021.   01/17/2020 - 02/12/2020 Radiation Therapy   Adjuvant radiation therapy   03/04/2020 -  Anti-estrogen oral therapy   Anastrozole switched to letrozole 08/20/2020 for muscle stiffness     CURRENT THERAPY: holding Tamoxifen  INTERVAL HISTORY: Amy Harrington 72 y.o. female returns for follow-up of leg pain. Amy Harrington started tamoxifen about one year ago.  She notes that 3-6 months into taking the Anastrozole/letrozole she developed leg pain and had to stop.  After one year of taking Tamoxifen she developed pain in her leg.  She underwent MRI pelvis w/wo contrast on 11/23/2022.  She was noted  to have severe tendinosis of the right gluteus medius tendon with a partial-thickness tear, and mild tendinosis of the right gluteus minimus.  She has been off the tamoxifen x 2 weeks and the pain is improved, but still present.       Patient Active Problem List   Diagnosis Date Noted   Malignant neoplasm of 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, estrogen receptor positive ([place]) 08/17/2019    is allergic to anastrozole and letrozole.  MEDICAL HISTORY: Past Medical History:  Diagnosis Date   GERD (gastroesophageal reflux disease)    Plantar fasciitis    PONV (postoperative nausea and vomiting)     SURGICAL HISTORY: Past Surgical History:  Procedure Laterality Date   BREAST LUMPECTOMY WITH RADIOACTIVE SEED AND SENTINEL LYMPH NODE BIOPSY Right 09/07/2019   Procedure: RIGHT BREAST LUMPECTOMY WITH RADIOACTIVE SEED AND SENTINEL LYMPH NODE BIOPSY;  Surgeon: Stark Klein, MD;  Location: [place];  Service: General;  Laterality: Right;   HERNIA REPAIR     PORT-A-CATH REMOVAL N/A 09/16/2020   Procedure: REMOVAL PORT-A-CATH;  Surgeon: Stark Klein, MD;  Location: [place];  Service: General;  Laterality: N/A;   PORTACATH PLACEMENT Left 09/07/2019   Procedure: INSERTION PORT-A-CATH WITH ULTRASOUND;  Surgeon: Stark Klein, MD;  Location: [place];  Service: General;  Laterality: Left;    SOCIAL HISTORY: Social History   Socioeconomic History   Marital status: Widowed    Spouse name: Not on file   Number of children: Not on file   Years of education: Not on file  Highest education level: Not on file  Occupational History   Not on file  Tobacco Use   Smoking status: Never   Smokeless tobacco: Never  Vaping Use   Vaping Use: Never used  Substance and Sexual Activity   Alcohol use: Not Currently   Drug use: Never   Sexual activity: Not on file  Other Topics Concern   Not on file  Social History Narrative   Not on  file   Social Determinants of Health   Financial Resource Strain: Not on file  Food Insecurity: Not on file  Transportation Needs: Not on file  Physical Activity: Not on file  Stress: Not on file  Social Connections: Not on file  Intimate Partner Violence: Not on file    FAMILY HISTORY: Family History  Problem Relation Age of Onset   Stomach cancer Father 37    Review of Systems  Constitutional:  Negative for appetite change, chills, fatigue, fever and unexpected weight change.  HENT:   Negative for hearing loss, lump/mass and trouble swallowing.   Eyes:  Negative for eye problems and icterus.  Respiratory:  Negative for chest tightness, cough and shortness of breath.   Cardiovascular:  Negative for chest pain, leg swelling and palpitations.  Gastrointestinal:  Negative for abdominal distention, abdominal pain, constipation, diarrhea, nausea and vomiting.  Endocrine: Negative for hot flashes.  Genitourinary:  Negative for difficulty urinating.   Musculoskeletal:  Positive for arthralgias.  Skin:  Negative for itching and rash.  Neurological:  Negative for dizziness, extremity weakness, headaches and numbness.  Hematological:  Negative for adenopathy. Does not bruise/bleed easily.  Psychiatric/Behavioral:  Negative for depression. The patient is not nervous/anxious.       PHYSICAL EXAMINATION  ECOG PERFORMANCE STATUS: 1 - Symptomatic but completely ambulatory  Vitals:   12/02/22 0900  BP: (!) 141/64  Pulse: 71  Resp: 18  Temp: (!) 97.3 F (36.3 C)  SpO2: 100%    Physical Exam Constitutional:      General: She is not in acute distress.    Appearance: Normal appearance. She is not toxic-appearing.  HENT:     Head: Normocephalic and atraumatic.  Eyes:     General: No scleral icterus. Cardiovascular:     Rate and Rhythm: Normal rate and regular rhythm.     Pulses: Normal pulses.     Heart sounds: Normal heart sounds.  Pulmonary:     Effort: Pulmonary effort  is normal.     Breath sounds: Normal breath sounds.  Abdominal:     General: Abdomen is flat. Bowel sounds are normal. There is no distension.     Palpations: Abdomen is soft.     Tenderness: There is no abdominal tenderness.  Musculoskeletal:        General: No swelling.     Cervical back: Neck supple.  Lymphadenopathy:     Cervical: No cervical adenopathy.  Skin:    General: Skin is warm and dry.     Findings: No rash.  Neurological:     General: No focal deficit present.     Mental Status: She is alert.  Psychiatric:        Mood and Affect: Mood normal.        Behavior: Behavior normal.     LABORATORY DATA:  None for this visit  ASSESSMENT and THERAPY PLAN:   Malignant neoplasm of 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, estrogen receptor positive ([place]) Amy Harrington is a 72 year old woman with history of right  sided stage Ia triple positive breast cancer diagnosed in November 2020.  She is status post lumpectomy, adjuvant chemotherapy, maintenance [name] therapy, adjuvant radiation therapy, and antiestrogen therapies with aromatase inhibitors initially and has since transition to tamoxifen.  1.  Leg pain: MRI noted tendinosis and partial-thickness tear.  Referral made to sports medicine 2.  Side effects from tamoxifen: Slight increase in achiness.  She will resume tamoxifen at 10 mg a day for 4 weeks and then go up to 20 mg daily.  She has follow-up with Dr. Lindi Adie in May 2024.  I reviewed her MRI findings with Dr. Georgina Snell in detail who believes that this injury is something that can be rehabilitated and does not need Ortho surgery referral.  I placed a referral to sports medicine after discussing this with him.  The patient verbalized understanding and is in agreement.  I gave her details about this in her after visit summary.  All questions were answered. The patient knows to call the clinic with any problems, questions or concerns. We can certainly see the patient much  sooner if necessary.  Total encounter time:30 minutes*in face-to-face visit time, chart review, lab review, care coordination, order entry, and documentation of the encounter time.    Wilber Bihari, NP 12/03/22 10:03 AM Medical Oncology and Hematology [facility] [place], [place] [address] Tel. [phone]    Fax. [phone]  *Total Encounter Time as defined by the Centers for Medicare and Medicaid Services includes, in addition to the face-to-face time of a patient visit (documented in the note above) non-face-to-face time: obtaining and reviewing outside history, ordering and reviewing medications, tests or procedures, care coordination (communications with other health care professionals or caregivers) and documentation in the medical record.

## 2022-12-03 NOTE — Assessment & Plan Note (Signed)
Amy Harrington is a 72 year old woman with history of right sided stage Ia triple positive breast cancer diagnosed in November 2020.  She is status post lumpectomy, adjuvant chemotherapy, maintenance [name] therapy, adjuvant radiation therapy, and antiestrogen therapies with aromatase inhibitors initially and has since transition to tamoxifen.  1.  Leg pain: MRI noted tendinosis and partial-thickness tear.  Referral made to sports medicine 2.  Side effects from tamoxifen: Slight increase in achiness.  She will resume tamoxifen at 10 mg a day for 4 weeks and then go up to 20 mg daily.  She has follow-up with Dr. Lindi Adie in May 2024.

## 2022-12-08 NOTE — Progress Notes (Unsigned)
   Shirlyn Goltz, PhD, LAT, ATC acting as a scribe for Lynne Leader, MD. Subjective:    CC: Right hip pain  HPI: Patient is a 72 year old female presenting with right hip pain ongoing for 3 to 6 months.  Patient is currently under the care of oncology for a malignant neoplasm of the right breast and is currently taking tamoxifen.  Patient locates pain to ***.  Dx imaging: 11/23/2022 pelvis MRI  11/19/2022 right hip x-ray  Pertinent review of Systems: ***  Relevant historical information: ***   Objective:   There were no vitals filed for this visit. General: Well Developed, well nourished, and in no acute distress.   MSK: ***  Lab and Radiology Results No results found for this or any previous visit (from the past 72 hour(s)). No results found.    Impression and Recommendations:    Assessment and Plan: 72 y.o. female with ***.  PDMP not reviewed this encounter. No orders of the defined types were placed in this encounter.  No orders of the defined types were placed in this encounter.   Discussed warning signs or symptoms. Please see discharge instructions. Patient expresses understanding.   ***

## 2022-12-09 ENCOUNTER — Ambulatory Visit (INDEPENDENT_AMBULATORY_CARE_PROVIDER_SITE_OTHER): Payer: Medicare Other | Admitting: Family Medicine

## 2022-12-09 ENCOUNTER — Encounter: Payer: Self-pay | Admitting: Family Medicine

## 2022-12-09 VITALS — BP 110/76 | HR 77 | Ht 63.0 in | Wt 188.4 lb

## 2022-12-09 DIAGNOSIS — M76891 Other specified enthesopathies of right lower limb, excluding foot: Secondary | ICD-10-CM

## 2022-12-09 DIAGNOSIS — S76011A Strain of muscle, fascia and tendon of right hip, initial encounter: Secondary | ICD-10-CM | POA: Diagnosis not present

## 2022-12-09 DIAGNOSIS — S76019A Strain of muscle, fascia and tendon of unspecified hip, initial encounter: Secondary | ICD-10-CM | POA: Insufficient documentation

## 2022-12-09 NOTE — Patient Instructions (Addendum)
Thank you for coming in today.   I've referred you to Physical Therapy.  Let us know if you don't hear from them in one week.   Recheck in 6 weeks.   Let me know if this is not working.   I will double check with Dr Sammuel Hines as well.

## 2022-12-15 ENCOUNTER — Other Ambulatory Visit: Payer: Self-pay | Admitting: Adult Health

## 2022-12-31 NOTE — Therapy (Signed)
OUTPATIENT PHYSICAL THERAPY  EVALUATION   Patient Name: Amy Harrington MRN: [id number] DOB:01/31/1951, 72 y.o., female Today's Date: 01/03/2023  END OF SESSION:  PT End of Session - 01/03/23 1005     Visit Number 1    Number of Visits 20    Date for PT Re-Evaluation 03/14/23    Authorization Type Medicare / BCBS    Progress Note Due on Visit 10    PT Start Time 1008    PT Stop Time 1044    PT Time Calculation (min) 36 min    Activity Tolerance Patient tolerated treatment well    Behavior During Therapy WFL for tasks assessed/performed             Past Medical History:  Diagnosis Date   GERD (gastroesophageal reflux disease)    Plantar fasciitis    PONV (postoperative nausea and vomiting)    Past Surgical History:  Procedure Laterality Date   BREAST LUMPECTOMY WITH RADIOACTIVE SEED AND SENTINEL LYMPH NODE BIOPSY Right 09/07/2019   Procedure: RIGHT BREAST LUMPECTOMY WITH RADIOACTIVE SEED AND SENTINEL LYMPH NODE BIOPSY;  Surgeon: Almond Lint, MD;  Location: [facility];  Service: General;  Laterality: Right;   HERNIA REPAIR     PORT-A-CATH REMOVAL N/A 09/16/2020   Procedure: REMOVAL PORT-A-CATH;  Surgeon: Almond Lint, MD;  Location: [facility];  Service: General;  Laterality: N/A;   PORTACATH PLACEMENT Left 09/07/2019   Procedure: INSERTION PORT-A-CATH WITH ULTRASOUND;  Surgeon: Almond Lint, MD;  Location: [facility];  Service: General;  Laterality: Left;   Patient Active Problem List   Diagnosis Date Noted   Tear of gluteus medius tendon 12/09/2022   Malignant neoplasm of 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, estrogen receptor positive 08/17/2019   Gastroesophageal reflux disease 10/10/2017    PCP: Merri Brunette, MD  REFERRING PROVIDER: Rodolph Bong, MD  REFERRING DIAG: [phone] (ICD-10-CM) - Hip abductor tendinitis, right  THERAPY DIAG:  Pain in right hip  Muscle weakness (generalized)  Difficulty  in walking, not elsewhere classified  Rationale for Evaluation and Treatment: Rehabilitation  ONSET DATE: 2- 3 months ago  SUBJECTIVE:   SUBJECTIVE STATEMENT: Pt indicated having onset of leg pain during cancer with medication usage.  Some improvements with changes of medication but continued to have complaints at times.  MRI was performed to check for cancer spreading (negative).  Pt indicated pain increase noted with prolonged standing/walking, lying on Lt hip and step up.    PERTINENT HISTORY: History of GERD, plantar fasciitis.  Breast cancer  PAIN:  NPRS scale: at current: 1/10    at worst: 5-6/10 Pain location: Rt hip, lateral thigh Pain description:  achy,   tightness/stiffness Aggravating factors: prolonged standing/walking, movements after inactivity (driving), stairs, lying on Rt side Relieving factors: heating pad, tylenol  PRECAUTIONS: None  WEIGHT BEARING RESTRICTIONS: No  FALLS:  Has patient fallen in last 6 months? 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  Walking for exercise, house work/gardening  PATIENT GOALS: Reduce pain,  stay active without symptoms, housework/garden without pain  OBJECTIVE:   PATIENT SURVEYS:  01/03/2023 FOTO intake:  63  predicted:  74  COGNITION: 01/03/2023 Overall cognitive status: WFL    SENSATION: 01/03/2023 WFL  MUSCLE LENGTH: 01/03/2023 Hamstrings passive  slr: Right 85 deg; Left 85 deg  POSTURE:  01/03/2023  WFL  PALPATION: 01/03/2023 Trigger points c concordant symptoms in Rt glute med/min   LOWER EXTREMITY ROM:   ROM Right 01/03/2023 Left 01/03/2023  Hip flexion [facility]   Hip extension    Hip abduction    Hip adduction    Hip internal rotation WFL   Hip external rotation Mild  discomfort noted in Rt lateral hip c supine passive end range assessment in 90 deg flexion.  WFL movement noted.    Knee flexion    Knee extension    Ankle dorsiflexion    Ankle plantarflexion    Ankle inversion    Ankle eversion     (Blank rows = not tested)  LOWER EXTREMITY MMT:  MMT Right 01/03/2023 Left 01/03/2023  Hip flexion 5/5 5/5  Hip extension    Hip abduction 3+/5 4/5  Hip adduction    Hip internal rotation    Hip external rotation    Knee flexion 5/5 5/5  Knee extension 5/5 5/5  Ankle dorsiflexion 5/5 5/5  Ankle plantarflexion    Ankle inversion    Ankle eversion     (Blank rows = not tested)  LOWER EXTREMITY SPECIAL TESTS:  01/03/2023 No specific testing today   FUNCTIONAL TESTS:  01/03/2023 18 inch chair transfer: s UE on 1st try  Lt SLS: 5 seconds Rt SLS:  2 seconds   GAIT: 01/03/2023 Independent ambulation, mild trendelenberg in stance on Rt   TODAY'S TREATMENT                                                                          DATE:01/03/2023 Therex:    HEP instruction/performance c cues for techniques, handout provided.  Trial set performed of each for comprehension and symptom assessment.  See below for exercise list  Manual Compression c movement to Rt glute med   PATIENT EDUCATION:  01/03/2023 Education details: HEP, POC Person educated: Patient Education method: Programmer, multimedia, Demonstration, Verbal cues, and Handouts Education comprehension: verbalized understanding, returned demonstration, and verbal cues required  HOME EXERCISE PROGRAM: Access Code: [email or web address] URL: [email or web address] Date: 01/03/2023 Prepared by: Chyrel Masson  Exercises - Supine Piriformis Stretch with Foot on Ground  - 2-3 x daily - 7 x weekly - 1 sets - 5 reps - 30 hold - Supine Lower Trunk Rotation  - 2-3 x daily - 7 x weekly - 1 sets - 3-5 reps - 15 hold - Supine Bridge  - 1-2 x daily - 7 x weekly - 1-2 sets - 10 reps - 2 hold - Clamshell  (Mirrored)  - 1-2 x daily - 7 x weekly - 3 sets - 10 reps - Sit to Stand  - 3 x daily - 7 x weekly - 1 sets - 10 reps  ASSESSMENT:  CLINICAL IMPRESSION: Patient is a 72 y.o. who comes to clinic with complaints of Rt hip pain with mobility, strength and movement coordination deficits that impair their ability to perform usual daily and recreational functional activities without increase difficulty/symptoms at this time.  Patient to benefit from skilled PT services to address impairments and limitations to improve to previous level of function without restriction secondary to condition.   OBJECTIVE  IMPAIRMENTS: Abnormal gait, decreased activity tolerance, decreased balance, decreased coordination, decreased endurance, decreased mobility, difficulty walking, decreased strength, increased fascial restrictions, impaired perceived functional ability, impaired flexibility, and pain.   ACTIVITY LIMITATIONS: lifting, bending, sitting, standing, squatting, sleeping, stairs, transfers, bed mobility, bathing, and locomotion level  PARTICIPATION LIMITATIONS: cleaning, laundry, interpersonal relationship, driving, shopping, community activity, occupation, and yard work  PERSONAL FACTORS:  History of GERD, plantar fasciitis, breast cancer  are also affecting patient's functional outcome.   REHAB POTENTIAL: Good  CLINICAL DECISION MAKING: Stable/uncomplicated  EVALUATION COMPLEXITY: Low   GOALS: Goals reviewed with patient? Yes  SHORT TERM GOALS: (target date for Short term goals are 3 weeks 01/24/2023)   1.  Patient will demonstrate independent use of home exercise program to maintain progress from in clinic treatments.  Goal status: New  LONG TERM GOALS: (target dates for all long term goals are 10 weeks  03/14/2023 )   1. Patient will demonstrate/report pain at worst less than or equal to 2/10 to facilitate minimal limitation in daily activity secondary to pain symptoms.  Goal status: New   2.  Patient will demonstrate independent use of home exercise program to facilitate ability to maintain/progress functional gains from skilled physical therapy services.  Goal status: New   3. Patient will demonstrate FOTO outcome > or = 74 % to indicate reduced disability due to condition.  Goal status: New   4.  Patient will demonstrate bilateral hip MMT 5/5 throughout to faciltiate usual transfers, stairs, squatting at [facility] for daily life.   Goal status: New   5.  Patient will demonstrate/report ability to ascend/descend stairs c reciprocal gait pattern s rail for household entry.  Goal status: New   6.  Patient will demonstrate/report ability to walk for exercise at PLOF s limitation.  Goal status: New     PLAN:  PT FREQUENCY: 1-2x/week  PT DURATION: 10 weeks  PLANNED INTERVENTIONS: Therapeutic exercises, Therapeutic activity, Neuro Muscular re-education, Balance training, Gait training, Patient/Family education, Joint mobilization, Stair training, DME instructions, Dry Needling, Electrical stimulation, Traction, Cryotherapy, vasopneumatic deviceMoist heat, Taping, Ultrasound, Ionotophoresis 4mg /ml Dexamethasone, and aquatic therapy, Manual therapy.  All included unless contraindicated  PLAN FOR NEXT SESSION: Review HEP knowledge/results.   Manual for trigger point release techniques.  Progressive posterior/lateral hip strengthening, balance improvements.    Chyrel Masson, PT, DPT, OCS, ATC 01/03/23  10:49 AM

## 2023-01-03 ENCOUNTER — Encounter: Payer: Self-pay | Admitting: Rehabilitative and Restorative Service Providers"

## 2023-01-03 ENCOUNTER — Ambulatory Visit (INDEPENDENT_AMBULATORY_CARE_PROVIDER_SITE_OTHER): Payer: Medicare Other | Admitting: Rehabilitative and Restorative Service Providers"

## 2023-01-03 ENCOUNTER — Other Ambulatory Visit: Payer: Self-pay

## 2023-01-03 DIAGNOSIS — M25551 Pain in right hip: Secondary | ICD-10-CM

## 2023-01-03 DIAGNOSIS — M6281 Muscle weakness (generalized): Secondary | ICD-10-CM

## 2023-01-03 DIAGNOSIS — R262 Difficulty in walking, not elsewhere classified: Secondary | ICD-10-CM | POA: Diagnosis not present

## 2023-01-05 ENCOUNTER — Encounter: Payer: Self-pay | Admitting: Rehabilitative and Restorative Service Providers"

## 2023-01-05 ENCOUNTER — Ambulatory Visit (INDEPENDENT_AMBULATORY_CARE_PROVIDER_SITE_OTHER): Payer: Medicare Other | Admitting: Rehabilitative and Restorative Service Providers"

## 2023-01-05 DIAGNOSIS — M25551 Pain in right hip: Secondary | ICD-10-CM

## 2023-01-05 DIAGNOSIS — R262 Difficulty in walking, not elsewhere classified: Secondary | ICD-10-CM | POA: Diagnosis not present

## 2023-01-05 DIAGNOSIS — M6281 Muscle weakness (generalized): Secondary | ICD-10-CM

## 2023-01-05 NOTE — Therapy (Addendum)
OUTPATIENT PHYSICAL THERAPY TREATMENT   Patient Name: Amy Harrington MRN: [id number] DOB:1951-05-26, 72 y.o., female Today's Date: 01/11/2023  END OF SESSION:   01/05/23 1011  PT Visits / Re-Eval  Visit Number 2  Number of Visits 20  Date for PT Re-Evaluation 03/14/23  Authorization  Authorization Type Medicare / BCBS  Progress Note Due on Visit 10  PT Time Calculation  PT Start Time 1009  PT Stop Time 1048  PT Time Calculation (min) 39 min  PT - End of Session  Activity Tolerance Patient tolerated treatment well  Behavior During Therapy WFL for tasks assessed/performed       Past Medical History:  Diagnosis Date   GERD (gastroesophageal reflux disease)    Plantar fasciitis    PONV (postoperative nausea and vomiting)    Past Surgical History:  Procedure Laterality Date   BREAST LUMPECTOMY WITH RADIOACTIVE SEED AND SENTINEL LYMPH NODE BIOPSY Right 09/07/2019   Procedure: RIGHT BREAST LUMPECTOMY WITH RADIOACTIVE SEED AND SENTINEL LYMPH NODE BIOPSY;  Surgeon: Almond Lint, MD;  Location: [facility];  Service: General;  Laterality: Right;   HERNIA REPAIR     PORT-A-CATH REMOVAL N/A 09/16/2020   Procedure: REMOVAL PORT-A-CATH;  Surgeon: Almond Lint, MD;  Location: [facility];  Service: General;  Laterality: N/A;   PORTACATH PLACEMENT Left 09/07/2019   Procedure: INSERTION PORT-A-CATH WITH ULTRASOUND;  Surgeon: Almond Lint, MD;  Location: [facility];  Service: General;  Laterality: Left;   Patient Active Problem List   Diagnosis Date Noted   Tear of gluteus medius tendon 12/09/2022   Malignant neoplasm of 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, estrogen receptor positive 08/17/2019   Gastroesophageal reflux disease 10/10/2017    PCP: Merri Brunette, MD  REFERRING PROVIDER: Rodolph Bong, MD  REFERRING DIAG: [phone] (ICD-10-CM) - Hip abductor tendinitis, right  THERAPY DIAG:  Pain in right hip  Muscle  weakness (generalized)  Difficulty in walking, not elsewhere classified  Rationale for Evaluation and Treatment: Rehabilitation  ONSET DATE: 2- 3 months ago  SUBJECTIVE:   SUBJECTIVE STATEMENT: She indicated feeling some complaints with stairs on way in and also noted with some bottom lift activity.    PERTINENT HISTORY: History of GERD, plantar fasciitis.  Breast cancer  PAIN:  NPRS scale: at current: 1/10    at worst: 4/10 Pain location: Rt hip, lateral thigh Pain description:  achy,   tightness/stiffness Aggravating factors: prolonged standing/walking, movements after inactivity (driving), stairs, lying on Rt side Relieving factors: heating pad, tylenol  PRECAUTIONS: None  WEIGHT BEARING RESTRICTIONS: No  FALLS:  Has patient fallen in last 6 months? 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  Walking for exercise, house work/gardening  PATIENT GOALS: Reduce pain,  stay active without symptoms, housework/garden without pain  OBJECTIVE:   PATIENT SURVEYS:  01/03/2023 FOTO intake:  63  predicted:  74  COGNITION: 01/03/2023 Overall cognitive status: WFL    SENSATION: 01/03/2023 WFL  MUSCLE LENGTH: 01/03/2023 Hamstrings passive slr: Right 85 deg; Left 85 deg  POSTURE:  01/03/2023  WFL  PALPATION: 01/03/2023 Trigger points c concordant symptoms in Rt glute med/min   LOWER EXTREMITY ROM:   ROM Right 01/03/2023 Left 01/03/2023  Hip flexion [facility]   Hip extension    Hip  abduction    Hip adduction    Hip internal rotation [facility]   Hip external rotation Mild discomfort noted in Rt lateral hip c supine passive end range assessment in 90 deg flexion.  WFL movement noted.    Knee flexion    Knee extension    Ankle dorsiflexion    Ankle  plantarflexion    Ankle inversion    Ankle eversion     (Blank rows = not tested)  LOWER EXTREMITY MMT:  MMT Right 01/03/2023 Left 01/03/2023  Hip flexion 5/5 5/5  Hip extension    Hip abduction 3+/5 4/5  Hip adduction    Hip internal rotation    Hip external rotation    Knee flexion 5/5 5/5  Knee extension 5/5 5/5  Ankle dorsiflexion 5/5 5/5  Ankle plantarflexion    Ankle inversion    Ankle eversion     (Blank rows = not tested)  LOWER EXTREMITY SPECIAL TESTS:  01/03/2023 No specific testing today   FUNCTIONAL TESTS:  01/03/2023 18 inch chair transfer: s UE on 1st try  Lt SLS: 5 seconds Rt SLS:  2 seconds   GAIT: 01/03/2023 Independent ambulation, mild trendelenberg in stance on Rt   TODAY'S TREATMENT                                                                          DATE:01/05/2023 Therex: Verbal review of existing HEP. Lateral step down 2 x 10 4 inch step, performed bilaterally Leg press single leg 2 x 15, performed bilaterally 31 lbs  Lateral stepping green band 10 ft x 5 each way  Nustep Lvl 5 8 mins UE/LE  Neuro Re-ed Tandem stance 1 min x 1 bilateral c occasional HHA to correct loss of balance  Manual Percussive device Rt glute med  TODAY'S TREATMENT                                                                          DATE:01/03/2023 Therex:    HEP instruction/performance c cues for techniques, handout provided.  Trial set performed of each for comprehension and symptom assessment.  See below for exercise list  Manual Compression c movement to Rt glute med   PATIENT EDUCATION:  01/03/2023 Education details: HEP, POC Person educated: Patient Education method: Programmer, multimedia, Demonstration, Verbal cues, and Handouts Education comprehension: verbalized understanding, returned demonstration, and verbal cues required  HOME EXERCISE PROGRAM: Access Code: [email or web address] URL: [email or web address] Date: 01/03/2023 Prepared by: Chyrel Masson  Exercises - Supine Piriformis Stretch with Foot on Ground  - 2-3 x daily - 7 x weekly - 1 sets - 5 reps - 30 hold - Supine Lower Trunk Rotation  - 2-3 x daily - 7 x weekly - 1 sets - 3-5 reps - 15 hold - Supine Bridge  - 1-2 x daily - 7 x weekly - 1-2 sets - 10 reps - 2 hold - Clamshell (Mirrored)  - 1-2 x daily - 7 x  weekly - 3 sets - 10 reps - Sit to Stand  - 3 x daily - 7 x weekly - 1 sets - 10 reps  ASSESSMENT:  CLINICAL IMPRESSION: Pt to benefit from continued strengthening for hip/LE to improve WB control and activity including stairs with reduced symptoms.  Good knowledge and tolerance overall to initial HEP.  Plan to continue work with existing HEP and supplemental manual and strengthening in clinic.    OBJECTIVE IMPAIRMENTS: Abnormal gait, decreased activity tolerance, decreased balance, decreased coordination, decreased endurance, decreased mobility, difficulty walking, decreased strength, increased fascial restrictions, impaired perceived functional ability, impaired flexibility, and pain.   ACTIVITY LIMITATIONS: lifting, bending, sitting, standing, squatting, sleeping, stairs, transfers, bed mobility, bathing, and locomotion level  PARTICIPATION LIMITATIONS: cleaning, laundry, interpersonal relationship, driving, shopping, community activity, occupation, and yard work  PERSONAL FACTORS:  History of GERD, plantar fasciitis, breast cancer  are also affecting patient's functional outcome.   REHAB POTENTIAL: Good  CLINICAL DECISION MAKING: Stable/uncomplicated  EVALUATION COMPLEXITY: Low   GOALS: Goals reviewed with patient? Yes  SHORT TERM GOALS: (target date for Short term goals are 3 weeks 01/24/2023)   1.  Patient will demonstrate independent use of home exercise program to maintain progress from in clinic treatments.  Goal status: on going 01/05/2023  LONG TERM GOALS: (target dates for all long term goals are 10 weeks  03/14/2023 )   1. Patient will  demonstrate/report pain at worst less than or equal to 2/10 to facilitate minimal limitation in daily activity secondary to pain symptoms.  Goal status: New   2. Patient will demonstrate independent use of home exercise program to facilitate ability to maintain/progress functional gains from skilled physical therapy services.  Goal status: New   3. Patient will demonstrate FOTO outcome > or = 74 % to indicate reduced disability due to condition.  Goal status: New   4.  Patient will demonstrate bilateral hip MMT 5/5 throughout to faciltiate usual transfers, stairs, squatting at [facility] for daily life.   Goal status: New   5.  Patient will demonstrate/report ability to ascend/descend stairs c reciprocal gait pattern s rail for household entry.  Goal status: New   6.  Patient will demonstrate/report ability to walk for exercise at PLOF s limitation.  Goal status: New     PLAN:  PT FREQUENCY: 1-2x/week  PT DURATION: 10 weeks  PLANNED INTERVENTIONS: Therapeutic exercises, Therapeutic activity, Neuro Muscular re-education, Balance training, Gait training, Patient/Family education, Joint mobilization, Stair training, DME instructions, Dry Needling, Electrical stimulation, Traction, Cryotherapy, vasopneumatic deviceMoist heat, Taping, Ultrasound, Ionotophoresis /ml Dexamethasone, and aquatic therapy, Manual therapy.  All included unless contraindicated  PLAN FOR NEXT SESSION: Manual as desired/indicated.  Check hip strength progression.    Chyrel Masson, PT, DPT, OCS, ATC 01/11/23  12:02 PM

## 2023-01-11 ENCOUNTER — Ambulatory Visit (INDEPENDENT_AMBULATORY_CARE_PROVIDER_SITE_OTHER): Payer: Medicare Other | Admitting: Rehabilitative and Restorative Service Providers"

## 2023-01-11 ENCOUNTER — Encounter: Payer: Self-pay | Admitting: Rehabilitative and Restorative Service Providers"

## 2023-01-11 DIAGNOSIS — M6281 Muscle weakness (generalized): Secondary | ICD-10-CM

## 2023-01-11 DIAGNOSIS — M25551 Pain in right hip: Secondary | ICD-10-CM | POA: Diagnosis not present

## 2023-01-11 DIAGNOSIS — R262 Difficulty in walking, not elsewhere classified: Secondary | ICD-10-CM

## 2023-01-11 NOTE — Therapy (Signed)
OUTPATIENT PHYSICAL THERAPY TREATMENT   Patient Name: Amy Harrington MRN: [id number] DOB:19-Aug-1951, 72 y.o., female Today's Date: 01/11/2023  END OF SESSION:  PT End of Session - 01/11/23 1140     Visit Number 3    Number of Visits 20    Date for PT Re-Evaluation 03/14/23    Authorization Type Medicare / BCBS    Progress Note Due on Visit 10    PT Start Time 1137    PT Stop Time 1216    PT Time Calculation (min) 39 min    Activity Tolerance Patient tolerated treatment well    Behavior During Therapy WFL for tasks assessed/performed               Past Medical History:  Diagnosis Date   GERD (gastroesophageal reflux disease)    Plantar fasciitis    PONV (postoperative nausea and vomiting)    Past Surgical History:  Procedure Laterality Date   BREAST LUMPECTOMY WITH RADIOACTIVE SEED AND SENTINEL LYMPH NODE BIOPSY Right 09/07/2019   Procedure: RIGHT BREAST LUMPECTOMY WITH RADIOACTIVE SEED AND SENTINEL LYMPH NODE BIOPSY;  Surgeon: Almond Lint, MD;  Location: [facility];  Service: General;  Laterality: Right;   HERNIA REPAIR     PORT-A-CATH REMOVAL N/A 09/16/2020   Procedure: REMOVAL PORT-A-CATH;  Surgeon: Almond Lint, MD;  Location: [facility];  Service: General;  Laterality: N/A;   PORTACATH PLACEMENT Left 09/07/2019   Procedure: INSERTION PORT-A-CATH WITH ULTRASOUND;  Surgeon: Almond Lint, MD;  Location: [facility];  Service: General;  Laterality: Left;   Patient Active Problem List   Diagnosis Date Noted   Tear of gluteus medius tendon 12/09/2022   Malignant neoplasm of 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, estrogen receptor positive 08/17/2019   Gastroesophageal reflux disease 10/10/2017    PCP: Merri Brunette, MD  REFERRING PROVIDER: Rodolph Bong, MD  REFERRING DIAG: [phone] (ICD-10-CM) - Hip abductor tendinitis, right  THERAPY DIAG:  Pain in right hip  Muscle weakness (generalized)  Difficulty  in walking, not elsewhere classified  Rationale for Evaluation and Treatment: Rehabilitation  ONSET DATE: 2- 3 months ago  SUBJECTIVE:   SUBJECTIVE STATEMENT: She indicated feeling like she wasn't as sore as before.    Reported stairs getting less painful.   PERTINENT HISTORY: History of GERD, plantar fasciitis.  Breast cancer  PAIN:  NPRS scale: at current: 1/10    at worst: 3/10 Pain location: Rt hip, lateral thigh Pain description:  achy,   tightness/stiffness Aggravating factors: prolonged standing/walking, movements after inactivity (driving), stairs, lying on Rt side Relieving factors: heating pad, tylenol  PRECAUTIONS: None  WEIGHT BEARING RESTRICTIONS: No  FALLS:  Has patient fallen in last 6 months? 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  Walking for exercise, house work/gardening  PATIENT GOALS: Reduce pain,  stay active without symptoms, housework/garden without pain  OBJECTIVE:   PATIENT SURVEYS:  01/03/2023 FOTO intake:  63  predicted:  74  COGNITION: 01/03/2023 Overall cognitive status: WFL    SENSATION: 01/03/2023 WFL  MUSCLE LENGTH: 01/03/2023 Hamstrings passive slr: Right 85 deg; Left 85 deg  POSTURE:  01/03/2023  WFL  PALPATION: 01/03/2023 Trigger points c concordant symptoms in Rt glute med/min   LOWER EXTREMITY ROM:   ROM Right  01/03/2023 Left 01/03/2023  Hip flexion WFL   Hip extension    Hip abduction    Hip adduction    Hip internal rotation [facility]   Hip external rotation Mild discomfort noted in Rt lateral hip c supine passive end range assessment in 90 deg flexion.  WFL movement noted.    Knee flexion    Knee extension    Ankle dorsiflexion    Ankle plantarflexion    Ankle inversion    Ankle  eversion     (Blank rows = not tested)  LOWER EXTREMITY MMT:  MMT Right 01/03/2023 Left 01/03/2023  Hip flexion 5/5 5/5  Hip extension    Hip abduction 3+/5 4/5  Hip adduction    Hip internal rotation    Hip external rotation    Knee flexion 5/5 5/5  Knee extension 5/5 5/5  Ankle dorsiflexion 5/5 5/5  Ankle plantarflexion    Ankle inversion    Ankle eversion     (Blank rows = not tested)  LOWER EXTREMITY SPECIAL TESTS:  01/03/2023 No specific testing today   FUNCTIONAL TESTS:  01/03/2023 18 inch chair transfer: s UE on 1st try  Lt SLS: 5 seconds Rt SLS:  2 seconds   GAIT: 01/03/2023 Independent ambulation, mild trendelenberg in stance on Rt   TODAY'S TREATMENT                                                                          DATE:01/11/2023 Therex: Clam Shell 2 x 15 rt Doorway hip hike 3 sec hold x 10 bilateral Lateral step down 2 x 10 6 inch step, performed bilaterally Leg press single leg 2 x 15, performed bilaterally 31 lbs  Nustep lvl 6 6 mins UE/LE  Manual Percussive device Rt glute med trigger points  TODAY'S TREATMENT                                                                          DATE:01/05/2023 Therex: Verbal review of existing HEP. Lateral step down 2 x 10 4 inch step, performed bilaterally Leg press single leg 2 x 15, performed bilaterally 31 lbs  Lateral stepping green band 10 ft x 5 each way  Nustep Lvl 5 8 mins UE/LE  Neuro Re-ed Tandem stance 1 min x 1 bilateral c occasional HHA to correct loss of balance  Manual Percussive device Rt glute med  TODAY'S TREATMENT                                                                          DATE:01/03/2023 Therex:    HEP instruction/performance c cues for techniques, handout provided.  Trial set performed of each for comprehension and symptom  assessment.  See below for exercise list  Manual Compression c movement to Rt glute med   PATIENT EDUCATION:  01/03/2023 Education details:  HEP, POC Person educated: Patient Education method: Programmer, multimedia, Demonstration, Verbal cues, and Handouts Education comprehension: verbalized understanding, returned demonstration, and verbal cues required  HOME EXERCISE PROGRAM: Access Code: [email or web address] URL: [email or web address] Date: 01/03/2023 Prepared by: Chyrel Masson  Exercises - Supine Piriformis Stretch with Foot on Ground  - 2-3 x daily - 7 x weekly - 1 sets - 5 reps - 30 hold - Supine Lower Trunk Rotation  - 2-3 x daily - 7 x weekly - 1 sets - 3-5 reps - 15 hold - Supine Bridge  - 1-2 x daily - 7 x weekly - 1-2 sets - 10 reps - 2 hold - Clamshell (Mirrored)  - 1-2 x daily - 7 x weekly - 3 sets - 10 reps - Sit to Stand  - 3 x daily - 7 x weekly - 1 sets - 10 reps  ASSESSMENT:  CLINICAL IMPRESSION: Positive improvements were noted in standing/stairs, walking activity.  Continued strengthening to help improve control inactivity.     OBJECTIVE IMPAIRMENTS: Abnormal gait, decreased activity tolerance, decreased balance, decreased coordination, decreased endurance, decreased mobility, difficulty walking, decreased strength, increased fascial restrictions, impaired perceived functional ability, impaired flexibility, and pain.   ACTIVITY LIMITATIONS: lifting, bending, sitting, standing, squatting, sleeping, stairs, transfers, bed mobility, bathing, and locomotion level  PARTICIPATION LIMITATIONS: cleaning, laundry, interpersonal relationship, driving, shopping, community activity, occupation, and yard work  PERSONAL FACTORS:  History of GERD, plantar fasciitis, breast cancer  are also affecting patient's functional outcome.   REHAB POTENTIAL: Good  CLINICAL DECISION MAKING: Stable/uncomplicated  EVALUATION COMPLEXITY: Low   GOALS: Goals reviewed with patient? Yes  SHORT TERM GOALS: (target date for Short term goals are 3 weeks 01/24/2023)   1.  Patient will demonstrate independent use of home exercise program  to maintain progress from in clinic treatments.  Goal status: on going 01/05/2023  LONG TERM GOALS: (target dates for all long term goals are 10 weeks  03/14/2023 )   1. Patient will demonstrate/report pain at worst less than or equal to 2/10 to facilitate minimal limitation in daily activity secondary to pain symptoms.  Goal status: New   2. Patient will demonstrate independent use of home exercise program to facilitate ability to maintain/progress functional gains from skilled physical therapy services.  Goal status: New   3. Patient will demonstrate FOTO outcome > or = 74 % to indicate reduced disability due to condition.  Goal status: New   4.  Patient will demonstrate bilateral hip MMT 5/5 throughout to faciltiate usual transfers, stairs, squatting at [facility] for daily life.   Goal status: New   5.  Patient will demonstrate/report ability to ascend/descend stairs c reciprocal gait pattern s rail for household entry.  Goal status: New   6.  Patient will demonstrate/report ability to walk for exercise at PLOF s limitation.  Goal status: New     PLAN:  PT FREQUENCY: 1-2x/week  PT DURATION: 10 weeks  PLANNED INTERVENTIONS: Therapeutic exercises, Therapeutic activity, Neuro Muscular re-education, Balance training, Gait training, Patient/Family education, Joint mobilization, Stair training, DME instructions, Dry Needling, Electrical stimulation, Traction, Cryotherapy, vasopneumatic deviceMoist heat, Taping, Ultrasound, Ionotophoresis /ml Dexamethasone, and aquatic therapy, Manual therapy.  All included unless contraindicated  PLAN FOR NEXT SESSION: Manual as desired/indicated.  Recheck hip MMT.    Chyrel Masson, PT, DPT, OCS, ATC 01/11/23  12:14 PM

## 2023-01-13 ENCOUNTER — Encounter: Payer: Medicare Other | Admitting: Rehabilitative and Restorative Service Providers"

## 2023-01-18 ENCOUNTER — Encounter: Payer: Self-pay | Admitting: Rehabilitative and Restorative Service Providers"

## 2023-01-18 ENCOUNTER — Ambulatory Visit (INDEPENDENT_AMBULATORY_CARE_PROVIDER_SITE_OTHER): Payer: Medicare Other | Admitting: Rehabilitative and Restorative Service Providers"

## 2023-01-18 DIAGNOSIS — M25551 Pain in right hip: Secondary | ICD-10-CM

## 2023-01-18 DIAGNOSIS — R262 Difficulty in walking, not elsewhere classified: Secondary | ICD-10-CM

## 2023-01-18 DIAGNOSIS — M6281 Muscle weakness (generalized): Secondary | ICD-10-CM | POA: Diagnosis not present

## 2023-01-18 NOTE — Therapy (Signed)
OUTPATIENT PHYSICAL THERAPY TREATMENT   Patient Name: Amy Harrington MRN: [id number] DOB:26-Apr-1951, 72 y.o., female Today's Date: 01/18/2023  END OF SESSION:  PT End of Session - 01/18/23 0841     Visit Number 4    Number of Visits 20    Date for PT Re-Evaluation 03/14/23    Authorization Type Medicare / BCBS    Progress Note Due on Visit 10    PT Start Time 0842    PT Stop Time 0911    PT Time Calculation (min) 29 min    Activity Tolerance Patient tolerated treatment well    Behavior During Therapy WFL for tasks assessed/performed                Past Medical History:  Diagnosis Date   GERD (gastroesophageal reflux disease)    Plantar fasciitis    PONV (postoperative nausea and vomiting)    Past Surgical History:  Procedure Laterality Date   BREAST LUMPECTOMY WITH RADIOACTIVE SEED AND SENTINEL LYMPH NODE BIOPSY Right 09/07/2019   Procedure: RIGHT BREAST LUMPECTOMY WITH RADIOACTIVE SEED AND SENTINEL LYMPH NODE BIOPSY;  Surgeon: Almond Lint, MD;  Location: [facility];  Service: General;  Laterality: Right;   HERNIA REPAIR     PORT-A-CATH REMOVAL N/A 09/16/2020   Procedure: REMOVAL PORT-A-CATH;  Surgeon: Almond Lint, MD;  Location: [facility];  Service: General;  Laterality: N/A;   PORTACATH PLACEMENT Left 09/07/2019   Procedure: INSERTION PORT-A-CATH WITH ULTRASOUND;  Surgeon: Almond Lint, MD;  Location: [facility];  Service: General;  Laterality: Left;   Patient Active Problem List   Diagnosis Date Noted   Tear of gluteus medius tendon 12/09/2022   Malignant neoplasm of 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, estrogen receptor positive (HCC) 08/17/2019   Gastroesophageal reflux disease 10/10/2017    PCP: Merri Brunette, MD  REFERRING PROVIDER: Rodolph Bong, MD  REFERRING DIAG: [phone] (ICD-10-CM) - Hip abductor tendinitis, right  THERAPY DIAG:  Pain in right hip  Muscle weakness  (generalized)  Difficulty in walking, not elsewhere classified  Rationale for Evaluation and Treatment: Rehabilitation  ONSET DATE: 2- 3 months ago  SUBJECTIVE:   SUBJECTIVE STATEMENT: Pt indicated having less pain at night with sleeping.  Reported some soreness with activity around house but not the same pain.   She indicated pressure lying down is getting better.   Pt reported overall improvement to normal at 50% at this time.    PERTINENT HISTORY: History of GERD, plantar fasciitis.  Breast cancer  PAIN:  NPRS scale: at worst weekend 1/10 Pain location: Rt hip, lateral thigh Pain description:  achy,   tightness/stiffness Aggravating factors: prolonged standing/walking, movements after inactivity (driving), stairs, lying on Rt side Relieving factors: heating pad, tylenol  PRECAUTIONS: None  WEIGHT BEARING RESTRICTIONS: No  FALLS:  Has patient fallen in last 6 months? 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  Walking for exercise, house work/gardening  PATIENT GOALS: Reduce pain,  stay active without symptoms, housework/garden without pain  OBJECTIVE:   PATIENT SURVEYS:  01/18/2023:  FOTO update 60  01/03/2023 FOTO intake:  63  predicted:  74  COGNITION: 01/03/2023 Overall cognitive status: WFL    SENSATION: 01/03/2023 WFL  MUSCLE LENGTH: 01/03/2023 Hamstrings passive slr: Right 85  deg; Left 85 deg  POSTURE:  01/03/2023  WFL  PALPATION: 01/03/2023 Trigger points c concordant symptoms in Rt glute med/min   LOWER EXTREMITY ROM:   ROM Right 01/03/2023 Left 01/03/2023  Hip flexion [facility]   Hip extension    Hip abduction    Hip adduction    Hip internal rotation [facility]   Hip external rotation Mild discomfort noted in Rt lateral hip c  supine passive end range assessment in 90 deg flexion.  WFL movement noted.    Knee flexion    Knee extension    Ankle dorsiflexion    Ankle plantarflexion    Ankle inversion    Ankle eversion     (Blank rows = not tested)  LOWER EXTREMITY MMT:  MMT Right 01/03/2023 Left 01/03/2023 Right 01/18/2023 Left 01/18/2023  Hip flexion 5/5 5/5    Hip extension      Hip abduction 3+/5 4/5 4/5 4+/5  Hip adduction      Hip internal rotation      Hip external rotation      Knee flexion 5/5 5/5    Knee extension 5/5 5/5    Ankle dorsiflexion 5/5 5/5    Ankle plantarflexion      Ankle inversion      Ankle eversion       (Blank rows = not tested)  LOWER EXTREMITY SPECIAL TESTS:  01/03/2023 No specific testing today   FUNCTIONAL TESTS:  01/03/2023 18 inch chair transfer: s UE on 1st try  Lt SLS: 5 seconds Rt SLS:  2 seconds   GAIT: 01/03/2023 Independent ambulation, mild trendelenberg in stance on Rt   TODAY'S TREATMENT                                                                          DATE:01/18/2023 Therex: Leg press single leg 2 x 15, performed bilaterally 31 lbs  Hip hike in doorway 3 sec hold x 5 bilateral  Cues for HEP review verbally.   Manual Percussive device Rt glute med trigger points  TODAY'S TREATMENT                                                                          DATE:01/11/2023 Therex: Clam Shell 2 x 15 rt Doorway hip hike 3 sec hold x 10 bilateral Lateral step down 2 x 10 6 inch step, performed bilaterally Leg press single leg 2 x 15, performed bilaterally 31 lbs  Nustep lvl 6 6 mins UE/LE  Manual Percussive device Rt glute med trigger points  TODAY'S TREATMENT  DATE:01/05/2023 Therex: Verbal review of existing HEP. Lateral step down 2 x 10 4 inch step, performed bilaterally Leg press single leg 2 x 15, performed bilaterally 31 lbs  Lateral stepping green band 10 ft x 5  each way  Nustep Lvl 5 8 mins UE/LE  Neuro Re-ed Tandem stance 1 min x 1 bilateral c occasional HHA to correct loss of balance  Manual Percussive device Rt glute med     PATIENT EDUCATION:  01/03/2023 Education details: HEP, POC Person educated: Patient Education method: Programmer, multimedia, Facilities manager, Verbal cues, and Handouts Education comprehension: verbalized understanding, returned demonstration, and verbal cues required  HOME EXERCISE PROGRAM: Access Code: [email or web address] URL: [email or web address] Date: 01/03/2023 Prepared by: Chyrel Masson  Exercises - Supine Piriformis Stretch with Foot on Ground  - 2-3 x daily - 7 x weekly - 1 sets - 5 reps - 30 hold - Supine Lower Trunk Rotation  - 2-3 x daily - 7 x weekly - 1 sets - 3-5 reps - 15 hold - Supine Bridge  - 1-2 x daily - 7 x weekly - 1-2 sets - 10 reps - 2 hold - Clamshell (Mirrored)  - 1-2 x daily - 7 x weekly - 3 sets - 10 reps - Sit to Stand  - 3 x daily - 7 x weekly - 1 sets - 10 reps  ASSESSMENT:  CLINICAL IMPRESSION: Pt has attended 4 visits overall.  Progression in symptom relief noted with reduced pain and reported 50% overall improvement.  See objective data for updated measurements of strength showing improvements.  Pt has demonstrated progression in ability to perform HEP with good techniques.  Pt to return to MD prior to next physical therapy visit.  Will review and discussion possible HEP transitioning per Pt desires/symptoms.    Reduce treatment time today due to overall good presentation and knowledge of HEP.    OBJECTIVE IMPAIRMENTS: Abnormal gait, decreased activity tolerance, decreased balance, decreased coordination, decreased endurance, decreased mobility, difficulty walking, decreased strength, increased fascial restrictions, impaired perceived functional ability, impaired flexibility, and pain.   ACTIVITY LIMITATIONS: lifting, bending, sitting, standing, squatting, sleeping, stairs,  transfers, bed mobility, bathing, and locomotion level  PARTICIPATION LIMITATIONS: cleaning, laundry, interpersonal relationship, driving, shopping, community activity, occupation, and yard work  PERSONAL FACTORS:  History of GERD, plantar fasciitis, breast cancer  are also affecting patient's functional outcome.   REHAB POTENTIAL: Good  CLINICAL DECISION MAKING: Stable/uncomplicated  EVALUATION COMPLEXITY: Low   GOALS: Goals reviewed with patient? Yes  SHORT TERM GOALS: (target date for Short term goals are 3 weeks 01/24/2023)   1.  Patient will demonstrate independent use of home exercise program to maintain progress from in clinic treatments.  Goal status: met  LONG TERM GOALS: (target dates for all long term goals are 10 weeks  03/14/2023 )   1. Patient will demonstrate/report pain at worst less than or equal to 2/10 to facilitate minimal limitation in daily activity secondary to pain symptoms.  Goal status: on going 01/18/2023   2. Patient will demonstrate independent use of home exercise program to facilitate ability to maintain/progress functional gains from skilled physical therapy services.  Goal status: on going 01/18/2023   3. Patient will demonstrate FOTO outcome > or = 74 % to indicate reduced disability due to condition.  Goal status: on going 01/18/2023   4.  Patient will demonstrate bilateral hip MMT 5/5 throughout to faciltiate usual transfers, stairs, squatting at [facility] for daily life.   Goal status: on going 01/18/2023  5.  Patient will demonstrate/report ability to ascend/descend stairs c reciprocal gait pattern s rail for household entry.  Goal status: on going 01/18/2023   6.  Patient will demonstrate/report ability to walk for exercise at PLOF s limitation.  Goal status: on going 01/18/2023     PLAN:  PT FREQUENCY: 1-2x/week  PT DURATION: 10 weeks  PLANNED INTERVENTIONS: Therapeutic exercises, Therapeutic activity, Neuro Muscular re-education,  Balance training, Gait training, Patient/Family education, Joint mobilization, Stair training, DME instructions, Dry Needling, Electrical stimulation, Traction, Cryotherapy, vasopneumatic deviceMoist heat, Taping, Ultrasound, Ionotophoresis 4mg /ml Dexamethasone, and aquatic therapy, Manual therapy.  All included unless contraindicated  PLAN FOR NEXT SESSION: Follow up on MD visit.   Possible HEP trial.    Chyrel Masson, PT, DPT, OCS, ATC 01/18/23  9:13 AM

## 2023-01-19 NOTE — Progress Notes (Unsigned)
   Rubin Payor, PhD, LAT, ATC acting as a scribe for Amy Graham, MD.  Amy Harrington is a 72 y.o. female who presents to [facility] today for f/u R hip pain due to gluteus medius partial tear and tendinopathy. Patient is currently under the care of oncology for a malignant neoplasm of the right breast. Pt was last seen by Dr. Denyse Amass on 12/09/22 and was referred to PT, completing 4 visits.   Today, pt reports ***  Dx imaging: 11/23/2022 pelvis MRI             11/19/2022 right hip x-ray  Pertinent review of systems: ***  Relevant historical information: ***   Exam:  There were no vitals taken for this visit. General: Well Developed, well nourished, and in no acute distress.   MSK: ***    Lab and Radiology Results No results found for this or any previous visit (from the past 72 hour(s)). No results found.     Assessment and Plan: 72 y.o. female with ***   PDMP not reviewed this encounter. No orders of the defined types were placed in this encounter.  No orders of the defined types were placed in this encounter.    Discussed warning signs or symptoms. Please see discharge instructions. Patient expresses understanding.   ***

## 2023-01-20 ENCOUNTER — Ambulatory Visit (INDEPENDENT_AMBULATORY_CARE_PROVIDER_SITE_OTHER): Payer: Medicare Other | Admitting: Family Medicine

## 2023-01-20 ENCOUNTER — Encounter: Payer: Self-pay | Admitting: Family Medicine

## 2023-01-20 VITALS — BP 122/82 | HR 74 | Ht 63.0 in | Wt 188.0 lb

## 2023-01-20 DIAGNOSIS — M76891 Other specified enthesopathies of right lower limb, excluding foot: Secondary | ICD-10-CM | POA: Diagnosis not present

## 2023-01-20 NOTE — Patient Instructions (Addendum)
Thank you for coming in today.   OK to wrap up PT in the near future and focus on home exercises.   I can re-order PT whenever you ask me to.   We can do more including shockwave therapy.   I can do a few different injections.   OK to do Medical illustrator.

## 2023-01-21 ENCOUNTER — Encounter: Payer: Medicare Other | Admitting: Rehabilitative and Restorative Service Providers"

## 2023-01-25 ENCOUNTER — Ambulatory Visit (INDEPENDENT_AMBULATORY_CARE_PROVIDER_SITE_OTHER): Payer: Medicare Other | Admitting: Rehabilitative and Restorative Service Providers"

## 2023-01-25 ENCOUNTER — Encounter: Payer: Self-pay | Admitting: Rehabilitative and Restorative Service Providers"

## 2023-01-25 DIAGNOSIS — R262 Difficulty in walking, not elsewhere classified: Secondary | ICD-10-CM

## 2023-01-25 DIAGNOSIS — M25551 Pain in right hip: Secondary | ICD-10-CM

## 2023-01-25 DIAGNOSIS — M6281 Muscle weakness (generalized): Secondary | ICD-10-CM

## 2023-01-25 NOTE — Therapy (Addendum)
OUTPATIENT PHYSICAL THERAPY TREATMENT /DISCHARGE   Patient Name: Amy Harrington MRN: [id number] DOB:[birth year], 72 y.o., female Today's Date: 01/25/2023  END OF SESSION:  PT End of Session - 01/25/23 0946     Visit Number 5    Number of Visits 20    Date for PT Re-Evaluation 03/14/23    Authorization Type Medicare / BCBS    Progress Note Due on Visit 10    PT Start Time 0936    PT Stop Time 0959    PT Time Calculation (min) 23 min    Activity Tolerance Patient tolerated treatment well    Behavior During Therapy WFL for tasks assessed/performed                 Past Medical History:  Diagnosis Date   GERD (gastroesophageal reflux disease)    Plantar fasciitis    PONV (postoperative nausea and vomiting)    Past Surgical History:  Procedure Laterality Date   BREAST LUMPECTOMY WITH RADIOACTIVE SEED AND SENTINEL LYMPH NODE BIOPSY Right 09/07/2019   Procedure: RIGHT BREAST LUMPECTOMY WITH RADIOACTIVE SEED AND SENTINEL LYMPH NODE BIOPSY;  Surgeon: Almond Lint, MD;  Location: [facility];  Service: General;  Laterality: Right;   HERNIA REPAIR     PORT-A-CATH REMOVAL N/A 09/16/2020   Procedure: REMOVAL PORT-A-CATH;  Surgeon: Almond Lint, MD;  Location: [facility];  Service: General;  Laterality: N/A;   PORTACATH PLACEMENT Left 09/07/2019   Procedure: INSERTION PORT-A-CATH WITH ULTRASOUND;  Surgeon: Almond Lint, MD;  Location: [facility];  Service: General;  Laterality: Left;   Patient Active Problem List   Diagnosis Date Noted   Tear of gluteus medius tendon 12/09/2022   Malignant neoplasm of 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, estrogen receptor positive (HCC) 08/17/2019   Gastroesophageal reflux disease 10/10/2017    PCP: Merri Brunette, MD  REFERRING PROVIDER: Rodolph Bong, MD  REFERRING DIAG: [phone] (ICD-10-CM) - Hip abductor tendinitis, right  THERAPY DIAG:  Pain in right hip  Muscle weakness  (generalized)  Difficulty in walking, not elsewhere classified  Rationale for Evaluation and Treatment: Rehabilitation  ONSET DATE: 2- 3 months ago  SUBJECTIVE:   SUBJECTIVE STATEMENT: She indicated seeing MD and had good visit.    PERTINENT HISTORY: History of GERD, plantar fasciitis.  Breast cancer  PAIN:  NPRS scale:   Pain location: Rt hip, lateral thigh Pain description:  achy,   tightness/stiffness Aggravating factors: prolonged standing/walking, movements after inactivity (driving), stairs, lying on Rt side Relieving factors: heating pad, tylenol  PRECAUTIONS: None  WEIGHT BEARING RESTRICTIONS: No  FALLS:  Has patient fallen in last 6 months? 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  Walking for exercise, house work/gardening  PATIENT GOALS: Reduce pain,  stay active without symptoms, housework/garden without pain  OBJECTIVE:   PATIENT SURVEYS:  01/25/2023:  FOTO update 73  01/18/2023:  FOTO update 60  01/03/2023 FOTO intake:  63  predicted:  74  COGNITION: 01/03/2023 Overall cognitive status: WFL    SENSATION: 01/03/2023 WFL  MUSCLE LENGTH: 01/03/2023 Hamstrings passive slr: Right 85 deg; Left 85 deg  POSTURE:  01/03/2023  WFL  PALPATION: 01/03/2023 Trigger points c concordant symptoms in Rt glute med/min   LOWER EXTREMITY ROM:   ROM Right  01/03/2023 Left 01/03/2023  Hip flexion WFL   Hip extension    Hip abduction    Hip adduction    Hip internal rotation [facility]   Hip external rotation Mild discomfort noted in Rt lateral hip c supine passive end range assessment in 90 deg flexion.  WFL movement noted.    Knee flexion    Knee extension    Ankle dorsiflexion    Ankle plantarflexion    Ankle inversion    Ankle  eversion     (Blank rows = not tested)  LOWER EXTREMITY MMT:  MMT Right 01/03/2023 Left 01/03/2023 Right 01/18/2023 Left 01/18/2023  Hip flexion 5/5 5/5    Hip extension      Hip abduction 3+/5 4/5 4/5 4+/5  Hip adduction      Hip internal rotation      Hip external rotation      Knee flexion 5/5 5/5    Knee extension 5/5 5/5    Ankle dorsiflexion 5/5 5/5    Ankle plantarflexion      Ankle inversion      Ankle eversion       (Blank rows = not tested)  LOWER EXTREMITY SPECIAL TESTS:  01/03/2023 No specific testing today   FUNCTIONAL TESTS:  01/03/2023 18 inch chair transfer: s UE on 1st try  Lt SLS: 5 seconds Rt SLS:  2 seconds   GAIT: 01/03/2023 Independent ambulation, mild trendelenberg in stance on Rt   TODAY'S TREATMENT                                                                          DATE:01/25/2023 Therex: Leg press single leg 2 x 15, performed bilaterally 31 lbs   Cues for HEP review verbally.   Manual Percussive device Rt glute med trigger points  TODAY'S TREATMENT                                                                          DATE:01/18/2023 Therex: Leg press single leg 2 x 15, performed bilaterally 31 lbs  Hip hike in doorway 3 sec hold x 5 bilateral  Cues for HEP review verbally.   Manual Percussive device Rt glute med trigger points  TODAY'S TREATMENT                                                                          DATE:01/11/2023 Therex: Clam Shell 2 x 15 rt Doorway hip hike 3 sec hold x 10 bilateral Lateral step down 2 x 10 6 inch step, performed bilaterally Leg press single leg 2 x 15, performed bilaterally 31 lbs  Nustep lvl 6 6 mins UE/LE  Manual Percussive device Rt glute  med trigger points  TODAY'S TREATMENT                                                                          DATE:01/05/2023 Therex: Verbal review of existing HEP. Lateral step down 2 x 10 4 inch step, performed bilaterally Leg press single leg  2 x 15, performed bilaterally 31 lbs  Lateral stepping green band 10 ft x 5 each way  Nustep Lvl 5 8 mins UE/LE  Neuro Re-ed Tandem stance 1 min x 1 bilateral c occasional HHA to correct loss of balance  Manual Percussive device Rt glute med     PATIENT EDUCATION:  01/03/2023 Education details: HEP, POC Person educated: Patient Education method: Programmer, multimedia, Facilities manager, Verbal cues, and Handouts Education comprehension: verbalized understanding, returned demonstration, and verbal cues required  HOME EXERCISE PROGRAM: Access Code: [email or web address] URL: [email or web address] Date: 01/03/2023 Prepared by: Chyrel Masson  Exercises - Supine Piriformis Stretch with Foot on Ground  - 2-3 x daily - 7 x weekly - 1 sets - 5 reps - 30 hold - Supine Lower Trunk Rotation  - 2-3 x daily - 7 x weekly - 1 sets - 3-5 reps - 15 hold - Supine Bridge  - 1-2 x daily - 7 x weekly - 1-2 sets - 10 reps - 2 hold - Clamshell (Mirrored)  - 1-2 x daily - 7 x weekly - 3 sets - 10 reps - Sit to Stand  - 3 x daily - 7 x weekly - 1 sets - 10 reps  ASSESSMENT:  CLINICAL IMPRESSION: Due to general improvements reported in pain and activity tolerance, Pt recommended for trial HEP.  Pt was in agreement with plan.  FOTO improved compared to last update.    OBJECTIVE IMPAIRMENTS: Abnormal gait, decreased activity tolerance, decreased balance, decreased coordination, decreased endurance, decreased mobility, difficulty walking, decreased strength, increased fascial restrictions, impaired perceived functional ability, impaired flexibility, and pain.   ACTIVITY LIMITATIONS: lifting, bending, sitting, standing, squatting, sleeping, stairs, transfers, bed mobility, bathing, and locomotion level  PARTICIPATION LIMITATIONS: cleaning, laundry, interpersonal relationship, driving, shopping, community activity, occupation, and yard work  PERSONAL FACTORS:  History of GERD, plantar fasciitis, breast cancer  are  also affecting patient's functional outcome.   REHAB POTENTIAL: Good  CLINICAL DECISION MAKING: Stable/uncomplicated  EVALUATION COMPLEXITY: Low   GOALS: Goals reviewed with patient? Yes  SHORT TERM GOALS: (target date for Short term goals are 3 weeks 01/24/2023)   1.  Patient will demonstrate independent use of home exercise program to maintain progress from in clinic treatments.  Goal status: met  LONG TERM GOALS: (target dates for all long term goals are 10 weeks  03/14/2023 )   1. Patient will demonstrate/report pain at worst less than or equal to 2/10 to facilitate minimal limitation in daily activity secondary to pain symptoms.  Goal status: Met 01/25/2023   2. Patient will demonstrate independent use of home exercise program to facilitate ability to maintain/progress functional gains from skilled physical therapy services.  Goal status: Met 01/25/2023   3. Patient will demonstrate FOTO outcome > or = 74 % to indicate reduced disability due to condition.  Goal status: mostly Met 01/25/2023   4.  Patient will demonstrate bilateral hip  MMT 5/5 throughout to faciltiate usual transfers, stairs, squatting at [facility] for daily life.   Goal status: on going 01/18/2023   5.  Patient will demonstrate/report ability to ascend/descend stairs c reciprocal gait pattern s rail for household entry.  Goal status: Met 01/25/2023   6.  Patient will demonstrate/report ability to walk for exercise at PLOF s limitation.  Goal status:Met 01/25/2023     PLAN:  PT FREQUENCY: 1-2x/week  PT DURATION: 10 weeks  PLANNED INTERVENTIONS: Therapeutic exercises, Therapeutic activity, Neuro Muscular re-education, Balance training, Gait training, Patient/Family education, Joint mobilization, Stair training, DME instructions, Dry Needling, Electrical stimulation, Traction, Cryotherapy, vasopneumatic deviceMoist heat, Taping, Ultrasound, Ionotophoresis 4mg /ml Dexamethasone, and aquatic therapy, Manual therapy.   All included unless contraindicated  PLAN FOR NEXT SESSION: HEP trial.  No visits scheduled at this time.   Discharge after 30 days inactivity.    Chyrel Masson, PT, DPT, OCS, ATC 01/25/23  10:02 AM  PHYSICAL THERAPY DISCHARGE SUMMARY  Visits from Start of Care: 5  Current functional level related to goals / functional outcomes: See note   Remaining deficits: See note   Education / Equipment: HEP  Patient goals were  mostly met . Patient is being discharged due to being pleased with the current functional level.  Chyrel Masson, PT, DPT, OCS, ATC 03/10/23  9:54 AM

## 2023-01-27 ENCOUNTER — Encounter: Payer: Medicare Other | Admitting: Rehabilitative and Restorative Service Providers"

## 2023-01-28 ENCOUNTER — Telehealth: Payer: Self-pay | Admitting: Hematology and Oncology

## 2023-01-28 NOTE — Telephone Encounter (Signed)
Rescheduled appointment per room/resource. Patient is aware of the changes made to her upcoming appointment. 

## 2023-02-13 NOTE — Progress Notes (Signed)
Patient Care Team: Merri Brunette, MD as PCP - General (Family Medicine) Bensimhon, Bevelyn Buckles, MD as PCP - Advanced Heart Failure (Cardiology) Donnelly Angelica, RN as Oncology Nurse Navigator Pershing Proud, RN as Oncology Nurse Navigator Serena Croissant, MD as Consulting Physician (Hematology and Oncology) Dorothy Puffer, MD as Consulting Physician (Radiation Oncology) Almond Lint, MD as Consulting Physician (General Surgery) Dorothy Puffer, MD as Consulting Physician (Radiation Oncology)  DIAGNOSIS:  Encounter Diagnosis  Name Primary?   Malignant neoplasm of 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, estrogen receptor positive (HCC) Yes    SUMMARY OF ONCOLOGIC HISTORY: Oncology History  Malignant neoplasm of 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, estrogen receptor positive (HCC)  08/17/2019 Initial Diagnosis   Routine screening mammogram detected a 0.8cm indeterminate mass in the right breast. Biopsy showed invasive ductal carcinoma, grade 2, HER-2 + by FISH, ER+ 100%, PR+ 70%, Ki67 15%.    08/22/2019 Cancer Staging   Staging form: Breast, AJCC 8th Edition - Clinical stage from 08/22/2019: Stage IA (cT1b, cN0, cM0, G2, ER+, PR+, HER2+) - Signed by Serena Croissant, MD on 08/22/2019   09/07/2019 Surgery   Right lumpectomy [facility]): IDC with calcifications, grade 2, 1.5cm, clear margins, 2 lymph nodes negative.    10/04/2019 -  Adjuvant Chemotherapy   Weekly Taxol and [name] x 12 completing on 12/20/2019, then maintenance [name] through the rest of 2021.   01/17/2020 - 02/12/2020 Radiation Therapy   Adjuvant radiation therapy   03/04/2020 -  Anti-estrogen oral therapy   Anastrozole switched to letrozole 08/20/2020 for muscle stiffness     CHIEF COMPLIANT: Follow-up on tamoxifen   INTERVAL HISTORY: Amy Harrington is a 72 year old with above-mentioned history right breast cancer who is currently on tamoxifen. She presents to the clinic today for a follow-up. She reports  that she had a tear in the right hip. She says it is better. She is using a stationary bike for therapy. She doesn't have the strength in her legs like she use too. She is seeing a physical therapy. She also walks and says she is active.  ALLERGIES:  is allergic to anastrozole and letrozole.  MEDICATIONS:  Current Outpatient Medications  Medication Sig Dispense Refill   lidocaine (XYLOCAINE) 5 % ointment 1 application as needed Externally Three times a day for 14 days     nystatin ointment (MYCOSTATIN) 1 application Externally Twice a day for 14 days     esomeprazole (NEXIUM) 20 MG capsule Take 20 mg by mouth daily at 12 noon.     Probiotic Product (PROBIOTIC ADVANCED PO) Take by mouth.     tamoxifen (NOLVADEX) 20 MG tablet TAKE 1 TABLET (20 MG TOTAL) BY MOUTH DAILY. 90 tablet 3   No current facility-administered medications for this visit.    PHYSICAL EXAMINATION: ECOG PERFORMANCE STATUS: 1 - Symptomatic but completely ambulatory  Vitals:   02/23/23 0925  BP: 129/85  Pulse: 69  Resp: 18  Temp: 97.9 F (36.6 C)  SpO2: 100%   Filed Weights   02/23/23 0925  Weight: 188 lb 9.6 oz (85.5 kg)    BREAST: No palpable masses or nodules in either right or left breasts. No palpable axillary supraclavicular or infraclavicular adenopathy no breast tenderness or nipple discharge. (exam performed in the presence of a chaperone)  LABORATORY DATA:  I have reviewed the data as listed    Latest Ref Rng & Units 08/07/2020    9:19 AM 06/26/2020    9:35 AM 05/15/2020  8:29 AM  CMP  Glucose 70 - 99 mg/dL 87  85  97   BUN 8 - 23 mg/dL 17  16  14    Creatinine 0.44 - 1.00 mg/dL 9.56  2.13  0.86   Sodium 135 - 145 mmol/L 140  140  141   Potassium 3.5 - 5.1 mmol/L 4.2  4.2  4.2   Chloride 98 - 111 mmol/L 108  108  108   CO2 22 - 32 mmol/L 26  27  27    Calcium 8.9 - 10.3 mg/dL 9.4  9.5  9.9   Total Protein 6.5 - 8.1 g/dL 6.7  6.4  6.4   Total Bilirubin 0.3 - 1.2 mg/dL 0.5  0.7  0.6   Alkaline  Phos 38 - 126 U/L 64  62  66   AST 15 - 41 U/L 23  23  24    ALT 0 - 44 U/L 21  20  19      Lab Results  Component Value Date   WBC 6.5 08/07/2020   HGB 13.2 08/07/2020   HCT 39.8 08/07/2020   MCV 89.4 08/07/2020   PLT 175 08/07/2020   NEUTROABS 4.4 08/07/2020    ASSESSMENT & PLAN:  Malignant neoplasm of 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, estrogen receptor positive (HCC) 09/07/2019:Right lumpectomy ([name]): IDC with calcifications, grade 2, 1.5cm, clear margins, 2 lymph nodes negative.  ER 100%, PR 70%, HER-2 positive, Ki-67 15% T1c N0 stage Ia   Treatment plan: 1. Adjuvant chemotherapy with Taxol [name] weekly x12 completed 12/20/2019 followed by [name] maintenance for 1 year completed December 2021 2.  Adjuvant radiation completed 01/2024 3.  Follow-up adjuvant antiestrogen therapy initially treated with anastrozole started June 2021 switched to letrozole November 2021 switched to Tamoxifen Feb 2022 due to severe leg pain ------------------------------------------------------------------------------------------------------------------------------  Current treatment: Letrozole toxicities: Severe leg pains, switched to Tamoxifen (10/02/20) Tamoxifen Toxicities: Denies any problems further with the legs.    Breast cancer surveillance:  1. [facility] mammogram 08/2022: Benign breast density category A 2.  Breast exam 02/23/2023: Benign Bone density 02/28/2020: T score -1.3: Mild osteopenia recommended exercise and vitamin D   She stays fairly active and walks up to 10,000 steps a day.   Return to clinic in 1 year for follow-up.    No orders of the defined types were placed in this encounter.  The patient has a good understanding of the overall plan. she agrees with it. she will call with any problems that may develop before the next visit here. Total time spent: 30 mins including face to face time and time spent for planning, charting and co-ordination of care   Tamsen Meek, MD 02/23/23    I Janan Ridge am acting as a Neurosurgeon for [employer]  I have reviewed the above documentation for accuracy and completeness, and I agree with the above.

## 2023-02-15 ENCOUNTER — Ambulatory Visit: Payer: Medicare Other | Admitting: Hematology and Oncology

## 2023-02-21 ENCOUNTER — Encounter (INDEPENDENT_AMBULATORY_CARE_PROVIDER_SITE_OTHER): Payer: Self-pay

## 2023-02-23 ENCOUNTER — Inpatient Hospital Stay: Payer: Medicare Other | Attending: Physician Assistant | Admitting: Hematology and Oncology

## 2023-02-23 ENCOUNTER — Other Ambulatory Visit: Payer: Self-pay

## 2023-02-23 VITALS — BP 129/85 | HR 69 | Temp 97.9°F | Resp 18 | Ht 63.0 in | Wt 188.6 lb

## 2023-02-23 DIAGNOSIS — C50211 Malignant neoplasm of upper-inner quadrant of right female breast: Secondary | ICD-10-CM | POA: Diagnosis present

## 2023-02-23 DIAGNOSIS — Z170 Estrogen receptor positive status [ER+]: Secondary | ICD-10-CM | POA: Insufficient documentation

## 2023-02-23 DIAGNOSIS — Z79810 Long term (current) use of selective estrogen receptor modulators (SERMs): Secondary | ICD-10-CM | POA: Diagnosis not present

## 2023-02-23 NOTE — Assessment & Plan Note (Signed)
09/07/2019:Right lumpectomy [facility]): IDC with calcifications, grade 2, 1.5cm, clear margins, 2 lymph nodes negative.  ER 100%, PR 70%, HER-2 positive, Ki-67 15% T1c N0 stage Ia   Treatment plan: 1. Adjuvant chemotherapy with Taxol [name] weekly x12 completed 12/20/2019 followed by [name] maintenance for 1 year completed December 2021 2.  Adjuvant radiation completed 01/2024 3.  Follow-up adjuvant antiestrogen therapy initially treated with anastrozole started June 2021 switched to letrozole November 2021 switched to Tamoxifen Feb 2022 due to severe leg pain ------------------------------------------------------------------------------------------------------------------------------  Current treatment: Letrozole toxicities: Severe leg pains, switched to Tamoxifen (10/02/20) Tamoxifen Toxicities: Denies any problems further with the legs.    Breast cancer surveillance:  1. [facility] mammogram 08/2022: Benign breast density category A 2.  Breast exam 02/23/2023: Benign Bone density 02/28/2020: T score -1.3: Mild osteopenia recommended exercise and vitamin D   She stays fairly active and walks up to 10,000 steps a day.   Return to clinic in 1 year for follow-up.

## 2023-02-24 ENCOUNTER — Encounter: Payer: Self-pay | Admitting: Hematology and Oncology

## 2023-02-28 ENCOUNTER — Encounter (INDEPENDENT_AMBULATORY_CARE_PROVIDER_SITE_OTHER): Payer: Self-pay

## 2023-02-28 ENCOUNTER — Telehealth: Payer: Self-pay | Admitting: Hematology and Oncology

## 2023-02-28 NOTE — Telephone Encounter (Signed)
Scheduled appointment per 6/5 los. Patient is aware of the made appointment.

## 2023-04-18 ENCOUNTER — Encounter (INDEPENDENT_AMBULATORY_CARE_PROVIDER_SITE_OTHER): Payer: Self-pay

## 2023-05-30 ENCOUNTER — Encounter (INDEPENDENT_AMBULATORY_CARE_PROVIDER_SITE_OTHER): Payer: Self-pay

## 2023-06-13 ENCOUNTER — Encounter (INDEPENDENT_AMBULATORY_CARE_PROVIDER_SITE_OTHER): Payer: Self-pay

## 2023-07-04 ENCOUNTER — Encounter (INDEPENDENT_AMBULATORY_CARE_PROVIDER_SITE_OTHER): Payer: Self-pay

## 2023-07-11 ENCOUNTER — Encounter (INDEPENDENT_AMBULATORY_CARE_PROVIDER_SITE_OTHER): Payer: Self-pay

## 2023-08-02 ENCOUNTER — Other Ambulatory Visit: Payer: Self-pay | Admitting: Obstetrics and Gynecology

## 2023-08-04 LAB — SURGICAL PATHOLOGY

## 2023-08-08 ENCOUNTER — Encounter (INDEPENDENT_AMBULATORY_CARE_PROVIDER_SITE_OTHER): Payer: Self-pay

## 2023-08-22 ENCOUNTER — Encounter (INDEPENDENT_AMBULATORY_CARE_PROVIDER_SITE_OTHER): Payer: Self-pay

## 2023-09-30 ENCOUNTER — Encounter: Payer: Self-pay | Admitting: Obstetrics and Gynecology

## 2023-09-30 ENCOUNTER — Ambulatory Visit (INDEPENDENT_AMBULATORY_CARE_PROVIDER_SITE_OTHER): Payer: Medicare Other | Admitting: Obstetrics and Gynecology

## 2023-09-30 VITALS — BP 135/81 | HR 78 | Ht 62.25 in | Wt 191.0 lb

## 2023-09-30 DIAGNOSIS — N393 Stress incontinence (female) (male): Secondary | ICD-10-CM

## 2023-09-30 DIAGNOSIS — R350 Frequency of micturition: Secondary | ICD-10-CM

## 2023-09-30 DIAGNOSIS — N812 Incomplete uterovaginal prolapse: Secondary | ICD-10-CM | POA: Diagnosis not present

## 2023-09-30 DIAGNOSIS — N3281 Overactive bladder: Secondary | ICD-10-CM | POA: Diagnosis not present

## 2023-09-30 LAB — POCT URINALYSIS DIPSTICK
Bilirubin, UA: NEGATIVE
Blood, UA: NEGATIVE
Glucose, UA: NEGATIVE
Ketones, UA: NEGATIVE
Leukocytes, UA: NEGATIVE
Nitrite, UA: NEGATIVE
Protein, UA: NEGATIVE
Spec Grav, UA: 1.01 (ref 1.010–1.025)
Urobilinogen, UA: 0.2 U/dL
pH, UA: 5.5 (ref 5.0–8.0)

## 2023-09-30 NOTE — Progress Notes (Signed)
 New Patient Evaluation and Consultation  Referring Provider: Rosalva Sawyer, MD PCP: Claudene Pellet, MD Date of Service: 09/30/2023  SUBJECTIVE Chief Complaint: New Patient (Initial Visit) Amy Harrington is a 73 y.o. female here for a consult for prolapse. Pt said she may need s hysterectomy./)  History of Present Illness: Amy Harrington is a 74 y.o. White or Caucasian female seen in consultation at the request of Dr Rosalva for evaluation of prolapse.    Review of records significant for: Has done chemo and radiation for breast cancer.  Had EMB for vaginal bleeding. Endometrial thickness 1.48cm.   Urinary Symptoms: Leaks urine with cough/ sneeze, with movement to the bathroom, and with urgency Leaks a few times per day, small amount Uses 3-4 pads per day The leakage is bothersome  Day time voids 5-6.  Nocturia: 1 times per night to void. Voiding dysfunction:  empties bladder well.  Patient does not use a catheter to empty bladder.  When urinating, patient feels she has no difficulties Drinks: 3-4 water, 1 soda (diet coke) per day, occasional tea  UTIs:  0  UTI's in the last year.   Denies history of blood in urine and kidney or bladder stones No results found for the last 90 days.   Pelvic Organ Prolapse Symptoms:                  Patient Admits to a feeling of a bulge the vaginal area. It has been present for 5 months.  Patient Admits to seeing a bulge.  This bulge is bothersome. She has a pessary that was placed by Dr Rosalva, cleaned about a month ago. Sometimes it can be a little irritating and has seen some drainage.   Bowel Symptom: Bowel movements: 2-3 time(s) per day Stool consistency: loose Straining: no.  Splinting: no.  Incomplete evacuation: no.  Patient Denies accidental bowel leakage / fecal incontinence Bowel regimen: none Last colonoscopy: Date 09/2021- pt reports negative   Sexual Function Sexually active: no.   Pelvic Pain Denies pelvic pain  Past Medical  History:  Past Medical History:  Diagnosis Date   Breast cancer    GERD (gastroesophageal reflux disease)    Plantar fasciitis    PONV (postoperative nausea and vomiting)      Past Surgical History:   Past Surgical History:  Procedure Laterality Date   BREAST LUMPECTOMY WITH RADIOACTIVE SEED AND SENTINEL LYMPH NODE BIOPSY Right 09/07/2019   Procedure: RIGHT BREAST LUMPECTOMY WITH RADIOACTIVE SEED AND SENTINEL LYMPH NODE BIOPSY;  Surgeon: Aron Shoulders, MD;  Location: [facility];  Service: General;  Laterality: Right;   HERNIA REPAIR     PORT-A-CATH REMOVAL N/A 09/16/2020   Procedure: REMOVAL PORT-A-CATH;  Surgeon: Aron Shoulders, MD;  Location: [facility];  Service: General;  Laterality: N/A;   PORTACATH PLACEMENT Left 09/07/2019   Procedure: INSERTION PORT-A-CATH WITH ULTRASOUND;  Surgeon: Aron Shoulders, MD;  Location: [facility];  Service: General;  Laterality: Left;     Past OB/GYN History: OB History  Gravida Para Term Preterm AB Living  2 2 2   2   SAB IAB Ectopic Multiple Live Births      2    # Outcome Date GA Lbr Len/2nd Weight Sex Type Anes PTL Lv  2 Term      Vag-Spont     1 Term      Vag-Forceps       EMB was performed 07/2023- negative, Endometrial stripe on US   was 1.48cm.  Pt reports no recent bleeding.      Component Value Date/Time   DIAGPAP  01/08/2021 0000    - Negative for intraepithelial lesion or malignancy (NILM)   HPVHIGH Negative 01/08/2021 0000   ADEQPAP  01/08/2021 0000    Satisfactory for evaluation; transformation zone component PRESENT.    Medications: Patient has a current medication list which includes the following prescription(s): esomeprazole, lidocaine , nystatin  ointment, probiotic product, and tamoxifen .   Allergies: Patient is allergic to anastrozole  and letrozole .   Social History:  Social History   Tobacco Use   Smoking status: Never   Smokeless tobacco: Never  Vaping Use    Vaping status: Never Used  Substance Use Topics   Alcohol use: Not Currently   Drug use: Never    Relationship status: widowed Patient lives alone Patient is employed at [employer]. Regular exercise: Yes: 10,000 steps per day History of abuse: No  Family History:   Family History  Problem Relation Age of Onset   Parkinson's disease Mother    Stomach cancer Father 66     Review of Systems: Review of Systems  Constitutional:  Negative for fever, malaise/fatigue and weight loss.  Respiratory:  Negative for cough, shortness of breath and wheezing.   Cardiovascular:  Negative for chest pain, palpitations and leg swelling.  Gastrointestinal:  Negative for abdominal pain and blood in stool.  Genitourinary:  Negative for dysuria.  Musculoskeletal:  Negative for myalgias.  Skin:  Negative for rash.  Neurological:  Negative for dizziness and headaches.  Endo/Heme/Allergies:  Does not bruise/bleed easily.  Psychiatric/Behavioral:  Negative for depression. The patient is not nervous/anxious.      OBJECTIVE Physical Exam: Vitals:   09/30/23 1131  BP: 135/81  Pulse: 78  Weight: 191 lb (86.6 kg)  Height: 5' 2.25 (1.581 m)    Physical Exam Vitals reviewed. Exam conducted with a chaperone present.  Constitutional:      General: She is not in acute distress. Pulmonary:     Effort: Pulmonary effort is normal.  Abdominal:     General: There is no distension.     Palpations: Abdomen is soft.     Tenderness: There is no abdominal tenderness. There is no rebound.  Musculoskeletal:        General: No swelling. Normal range of motion.  Skin:    General: Skin is warm and dry.     Findings: No rash.  Neurological:     Mental Status: She is alert and oriented to person, place, and time.  Psychiatric:        Mood and Affect: Mood normal.        Behavior: Behavior normal.      GU / Detailed Urogynecologic Evaluation:  Pelvic Exam: Normal external female genitalia;  Bartholin's and Skene's glands normal in appearance; urethral meatus normal in appearance, no urethral masses or discharge.   CST: negative Pessary removed and cleaned Speculum exam reveals normal vaginal mucosa with atrophy. Cervix normal appearance. Uterus normal single, nontender. Adnexa no mass, fullness, tenderness.    Pelvic floor strength II/V  Pelvic floor musculature: Right levator non-tender, Right obturator non-tender, Left levator non-tender, Left obturator non-tender Pessary replaced  POP-Q:   POP-Q  -1                                            Aa   -  1                                           Ba  -4                                              C   4                                            Gh  4.5                                            Pb  8                                            tvl   0                                            Ap  0                                            Bp  -6.5                                              D      Rectal Exam:  Normal external rectum  Post-Void Residual (PVR) by Bladder Scan: In order to evaluate bladder emptying, we discussed obtaining a postvoid residual and patient agreed to this procedure.  Procedure: The ultrasound unit was placed on the patient's abdomen in the suprapubic region after the patient had voided.    Post Void Residual - 09/30/23 1149       Post Void Residual   Post Void Residual 28 mL              Laboratory Results: Lab Results  Component Value Date   COLORU Yellow 09/30/2023   CLARITYU Clear 09/30/2023   GLUCOSEUR Negative 09/30/2023   BILIRUBINUR Negative 09/30/2023   KETONESU Negative 09/30/2023   SPECGRAV 1.010 09/30/2023   RBCUR Negative 09/30/2023   PHUR 5.5 09/30/2023   PROTEINUR Negative 09/30/2023   UROBILINOGEN 0.2 09/30/2023   LEUKOCYTESUR Negative 09/30/2023    Lab Results  Component Value Date   CREATININE 0.83 08/07/2020   CREATININE 0.79  06/26/2020   CREATININE 0.84 05/15/2020    No results found for: HGBA1C  Lab Results  Component Value Date   HGB 13.2 08/07/2020     ASSESSMENT AND PLAN Ms. Blann is a 73 y.o. with:  1. Uterovaginal prolapse, incomplete   2. SUI (stress urinary incontinence,  female)   3. Overactive bladder   4. Urinary frequency     Stage II anterior, Stage II posterior, Stage I apical prolapse - For treatment of pelvic organ prolapse, we discussed options for management including expectant management, conservative management, and surgical management, such as Kegels, a pessary, pelvic floor physical therapy, and specific surgical procedures. - She is interested in surgery. Recommended hysterectomy due to history of PMB (normal EMB).  - She is not interested in being sexually active in the future. Therefore recommended TVH, BSO, colpocleisis and perineorrhaphy. Handout provided.   2. Incontinence - We discussed the symptoms of overactive bladder (OAB), which include urinary urgency, urinary frequency, nocturia, with or without urge incontinence.  While we do not know the exact etiology of OAB, several treatment options exist. We discussed management including behavioral therapy (decreasing bladder irritants, urge suppression strategies, timed voids, bladder retraining), physical therapy, medication.  - For treatment of stress urinary incontinence,  non-surgical options include expectant management, weight loss, physical therapy, as well as a pessary.  Surgical options include a midurethral sling, Burch urethropexy, and transurethral injection of a bulking agent. - Will have her undergo urodynamic testing to evaluate incontinence. She is interested in a sling if needed.   Return for urodynamics  Rosaline LOISE Caper, MD

## 2023-09-30 NOTE — Patient Instructions (Signed)

## 2023-10-17 ENCOUNTER — Encounter (INDEPENDENT_AMBULATORY_CARE_PROVIDER_SITE_OTHER): Payer: Self-pay

## 2023-10-19 ENCOUNTER — Ambulatory Visit (INDEPENDENT_AMBULATORY_CARE_PROVIDER_SITE_OTHER): Payer: Medicare Other | Admitting: Obstetrics and Gynecology

## 2023-10-19 ENCOUNTER — Encounter: Payer: Self-pay | Admitting: Obstetrics and Gynecology

## 2023-10-19 VITALS — BP 124/73 | HR 83

## 2023-10-19 DIAGNOSIS — N393 Stress incontinence (female) (male): Secondary | ICD-10-CM | POA: Diagnosis not present

## 2023-10-19 DIAGNOSIS — N812 Incomplete uterovaginal prolapse: Secondary | ICD-10-CM | POA: Diagnosis not present

## 2023-10-19 DIAGNOSIS — N3281 Overactive bladder: Secondary | ICD-10-CM | POA: Diagnosis not present

## 2023-10-19 DIAGNOSIS — N952 Postmenopausal atrophic vaginitis: Secondary | ICD-10-CM | POA: Diagnosis not present

## 2023-10-19 DIAGNOSIS — R350 Frequency of micturition: Secondary | ICD-10-CM

## 2023-10-19 LAB — POCT URINALYSIS DIPSTICK
Bilirubin, UA: NEGATIVE
Blood, UA: NEGATIVE
Glucose, UA: NEGATIVE
Ketones, UA: NEGATIVE
Leukocytes, UA: NEGATIVE
Nitrite, UA: NEGATIVE
Protein, UA: NEGATIVE
Spec Grav, UA: 1.01 (ref 1.010–1.025)
Urobilinogen, UA: 0.2 U/dL
pH, UA: 6 (ref 5.0–8.0)

## 2023-10-19 MED ORDER — ESTRADIOL 0.1 MG/GM VA CREA: 0.5000 g | TOPICAL_CREAM | VAGINAL | 11 refills | Status: AC

## 2023-10-19 NOTE — Patient Instructions (Signed)
Taking Care of Yourself after Urodynamics  Drink plenty of water for a day or two following your procedure. Try to have about 8 ounces (one cup) at a time, and do this 6 times or more per day unless you have fluid restrictitons AVOID irritative beverages such as coffee, tea, soda, alcoholic or citrus drinks for a day or two, as this may cause burning with urination.  For the first 1-2 days after the procedure, your urine may be pink or red in color. You may have some blood in your urine as a normal side effect of the procedure. Large amounts of bleeding or difficulty urinating are NOT normal. Call the nurse line if this happens or go to the nearest Emergency Room if the bleeding is heavy or you cannot urinate at all and it is after hours. If you had a Bulkamid injection in the urethra and need to be catheterized, ask for a pediatric catheter to be used (size 10 or 12-French) so the material is not pushed out of place.   You may experience some discomfort or a burning sensation with urination after having this procedure. You can use over the counter Azo or pyridium to help with burning and follow the instructions on the packaging. If it does not improve within 1-2 days, or other symptoms appear (fever, chills, or difficulty urinating) call the office to speak to a nurse.  You may return to normal daily activities such as work, school, driving, exercising and housework on the day of the procedure. If your doctor gave you a prescription, take it as ordered.     Use the estrogen cream nightly for 2 weeks and then twice a week after. You can use the applicator or a blueberry sized amount on your finger to apply.

## 2023-10-19 NOTE — Progress Notes (Signed)
[facility] Urodynamics Procedure  Referring Physician: Merri Brunette, MD Date of Procedure: 10/19/2023  Amy Harrington is a 73 y.o. female who presents for urodynamic evaluation. Indication(s) for study: occult SUI  Vital Signs: BP 124/73   Pulse 83   Laboratory Results: A catheterized urine specimen revealed:  Lab Results  Component Value Date   COLORU yellow 10/19/2023   CLARITYU clear 10/19/2023   GLUCOSEUR Negative 10/19/2023   BILIRUBINUR negative 10/19/2023   KETONESU negative 10/19/2023   SPECGRAV 1.010 10/19/2023   RBCUR negative 10/19/2023   PHUR 6.0 10/19/2023   PROTEINUR Negative 10/19/2023   UROBILINOGEN 0.2 10/19/2023   LEUKOCYTESUR Negative 10/19/2023      Voiding Diary: Deferred  Procedure Timeout:  The correct patient was verified and the correct procedure was verified. The patient was in the correct position and safety precautions were reviewed based on at the patient's history.  Urodynamic Procedure A 41F dual lumen urodynamics catheter was placed under sterile conditions into the patient's bladder. A 41F catheter was placed into the rectum in order to measure abdominal pressure. EMG patches were placed in the appropriate position.  All connections were confirmed and calibrations/adjusted made. Saline was instilled into the bladder through the dual lumen catheters.  Cough/valsalva pressures were measured periodically during filling.  Patient was allowed to void.  The bladder was then emptied of its residual.  UROFLOW: Did not capture. Patient voided and had a PVR of 30ml  CMG: This was performed with sterile water in the sitting position at a fill rate of 30 mL/min.    First sensation of fullness was 110 mLs,  First urge was 122 mLs,  Strong urge was 145 mLs and  Capacity was 365 mLs  Stress incontinence was demonstrated Highest positive Barrier CLPP was 45 cmH20 at 228 ml. Highest positive Barrier VLPP was 57 cmH20 at 228  ml.  Detrusor function was overactive, with phasic contractions seen.  The first occurred at  32.6 mL to 10.8 cm of water and was not associated with urge.  Compliance:  Normal. End fill detrusor pressure was 0cmH20.    UPP: MUCP with barrier reduction was 58 cm of water.    MICTURITION STUDY: Voiding was performed with reduction using scopettes in the sitting position.  Pdet at Qmax was 22.2 cm of water.  Qmax was 12.3 mL/sec.  It was a prolonged normal pattern.  She voided 365 mL and had a residual of 0 mL.  It was a volitional void, sustained detrusor contraction was present and abdominal straining was not present  EMG: This was performed with patches.  She had voluntary contractions, recruitment with fill was present and urethral sphincter was not relaxed with void.  The details of the procedure with the study tracings have been scanned into EPIC.   Urodynamic Impression:  1. Sensation was normal; capacity was normal 2. Stress Incontinence was demonstrated at normal pressures; 3. 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 4. Emptying was dysfunctional with a normal PVR, a sustained detrusor contraction present,  abdominal straining not present, dyssynergic urethral sphincter activity on EMG.  Plan: - The patient will follow up  to discuss the findings and treatment options.  - Patient had a large ring pessary that was causing discomfort. She has a stage II/IV anterior, II/IV posterior and I/IV uterine prolapse. #4 Long Stem Gellhorn pessary (Lot [id number]) placed. Patient was able to walk appropriately and urinate without pessary expulsion. -Patient's vaginal tissues are very irritated and she is  not on vaginal estrogen. New prescription for vaginal estrogen sent in for vaginal atrophy.  Separate time spend discussing vaginal atrophy and treatment.

## 2023-10-24 ENCOUNTER — Encounter (INDEPENDENT_AMBULATORY_CARE_PROVIDER_SITE_OTHER): Payer: Self-pay

## 2023-10-28 ENCOUNTER — Encounter: Payer: Self-pay | Admitting: Obstetrics and Gynecology

## 2023-10-28 ENCOUNTER — Ambulatory Visit (INDEPENDENT_AMBULATORY_CARE_PROVIDER_SITE_OTHER): Payer: Medicare Other | Admitting: Obstetrics and Gynecology

## 2023-10-28 VITALS — BP 129/71 | HR 83

## 2023-10-28 DIAGNOSIS — Z01818 Encounter for other preprocedural examination: Secondary | ICD-10-CM

## 2023-10-28 DIAGNOSIS — N812 Incomplete uterovaginal prolapse: Secondary | ICD-10-CM | POA: Diagnosis not present

## 2023-10-28 DIAGNOSIS — N3281 Overactive bladder: Secondary | ICD-10-CM | POA: Diagnosis not present

## 2023-10-28 DIAGNOSIS — N393 Stress incontinence (female) (male): Secondary | ICD-10-CM

## 2023-10-28 NOTE — Progress Notes (Signed)
 [facility] Return Visit  SUBJECTIVE  History of Present Illness: Amy Harrington is a 73 y.o. female seen in follow-up for prolapse and incontinence. She underwent urodynamic testing.   Urodynamic Impression:  1. Sensation was normal; capacity was normal 2. Stress Incontinence was demonstrated at normal pressures; 3. 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 4. Emptying was dysfunctional with a normal PVR, a sustained detrusor contraction present,  abdominal straining not present, dyssynergic urethral sphincter activity on EMG.    Past Medical History: Patient  has a past medical history of Breast cancer, GERD (gastroesophageal reflux disease), Plantar fasciitis, and PONV (postoperative nausea and vomiting).   Past Surgical History: She  has a past surgical history that includes Hernia repair; Breast lumpectomy with radioactive seed and sentinel lymph node biopsy (Right, 09/07/2019); Portacath placement (Left, 09/07/2019); and Port-a-cath removal (N/A, 09/16/2020).   Medications: She has a current medication list which includes the following prescription(s): esomeprazole, estradiol , and tamoxifen .   Allergies: Patient is allergic to anastrozole  and letrozole .   Social History: Patient  reports that she has never smoked. She has never used smokeless tobacco. She reports that she does not currently use alcohol. She reports that she does not use drugs.     OBJECTIVE     Physical Exam: Vitals:   10/28/23 1409  BP: 129/71  Pulse: 83   Gen: No apparent distress, A&O x 3.  Detailed Urogynecologic Evaluation:  Deferred. Prior exam showed:  POP-Q   -1                                            Aa   -1                                           Ba   -4                                              C    4                                            Gh   4.5                                            Pb   8                                             tvl    0                                            Ap   0  Bp   -6.5                                              D        ASSESSMENT AND PLAN    Amy Harrington is a 73 y.o. with:  1. Uterovaginal prolapse, incomplete   2. SUI (stress urinary incontinence, female)   3. Overactive bladder     Plan for surgery: Exam under anesthesia, Total vaginal hysterectomy with bilateral salpingo-oophorectomy, colpocleisis, perineorrhaphy, midurethral sling, cystoscopy  - We reviewed the patient's specific anatomic and functional findings, with the assistance of diagrams, and together finalized the above procedure. The planned surgical procedures were discussed along with the surgical risks outlined below, which were also provided on a detailed handout. Additional treatment options including expectant management, conservative management, medical management were discussed where appropriate.  We reviewed the benefits and risks of each treatment option.   General Surgical Risks: For all procedures, there are risks of bleeding, infection, damage to surrounding organs including but not limited to bowel, bladder, blood vessels, ureters and nerves, and need for further surgery if an injury were to occur. These risks are all low with minimally invasive surgery.   There are risks of numbness and weakness at any body site or buttock/rectal pain.  It is possible that baseline pain can be worsened by surgery, either with or without mesh. If surgery is vaginal, there is also a low risk of possible conversion to laparoscopy or open abdominal incision where indicated. Very rare risks include blood transfusion, blood clot, heart attack, pneumonia, or death.   There is also a risk of short-term postoperative urinary retention with need to use a catheter. About half of patients need to go home from surgery with a catheter, which is then later removed in the office. The risk of  long-term need for a catheter is very low. There is also a risk of worsening of overactive bladder.   Sling: The effectiveness of a midurethral vaginal mesh sling is approximately 85%, and thus, there will be times when you may leak urine after surgery, especially if your bladder is full or if you have a strong cough. There is a balance between making the sling tight enough to treat your leakage but not too tight so that you have long-term difficulty emptying your bladder. A mesh sling will not directly treat overactive bladder/urge incontinence and may worsen it.  There is an FDA safety notification on vaginal mesh procedures for prolapse but NOT mesh slings. We have extensive experience and training with mesh placement and we have close postoperative follow up to identify any potential complications from mesh. It is important to realize that this mesh is a permanent implant that cannot be easily removed. There are rare risks of mesh exposure (2-4%), pain with intercourse (0-7%), and infection (<1%). The risk of mesh exposure if more likely in a woman with risks for poor healing (prior radiation, poorly controlled diabetes, or immunocompromised). The risk of new or worsened chronic pain after mesh implant is more common in women with baseline chronic pain and/or poorly controlled anxiety or depression. Approximately 2-4% of patients will experience longer-term post-operative voiding dysfunction that may require surgical revision of the sling. We also reviewed that postoperatively, her stream may not be as strong as before surgery.   Prolapse (with or without mesh): Risk factors for  surgical failure  include things that put pressure on your pelvis and the surgical repair, including obesity, chronic cough, and heavy lifting or straining (including lifting children or adults, straining on the toilet, or lifting heavy objects such as furniture or anything weighing >25 lbs. Risks of recurrence is 20-30% with  vaginal native tissue repair and a less than 10% with sacrocolpopexy with mesh.    - For preop Visit:  She is required to have a visit within 30 days of her surgery.   - Medical clearance: not required  - Anticoagulant use: No - Medicaid Hysterectomy form: No - Accepts blood transfusion: Yes - Expected length of stay: outpatient   Rosaline LOISE Caper, MD

## 2023-10-28 NOTE — Patient Instructions (Signed)
 Plan for surgery: Exam under anesthesia, total vaginal hysterectomy, removal of tubes and ovaries, colpocleisis and perineorrhaphy, midurethral sling, cystoscopy

## 2023-11-14 ENCOUNTER — Encounter (INDEPENDENT_AMBULATORY_CARE_PROVIDER_SITE_OTHER): Payer: Self-pay

## 2023-11-23 ENCOUNTER — Encounter: Payer: Self-pay | Admitting: Obstetrics and Gynecology

## 2023-11-28 ENCOUNTER — Encounter (INDEPENDENT_AMBULATORY_CARE_PROVIDER_SITE_OTHER): Payer: Self-pay

## 2023-12-09 ENCOUNTER — Other Ambulatory Visit: Payer: Self-pay | Admitting: Hematology and Oncology

## 2023-12-26 ENCOUNTER — Ambulatory Visit (INDEPENDENT_AMBULATORY_CARE_PROVIDER_SITE_OTHER): Admitting: Obstetrics and Gynecology

## 2023-12-26 ENCOUNTER — Other Ambulatory Visit: Payer: Self-pay | Admitting: Obstetrics and Gynecology

## 2023-12-26 VITALS — BP 118/76 | HR 76

## 2023-12-26 DIAGNOSIS — N812 Incomplete uterovaginal prolapse: Secondary | ICD-10-CM

## 2023-12-26 DIAGNOSIS — Z01818 Encounter for other preprocedural examination: Secondary | ICD-10-CM

## 2023-12-26 DIAGNOSIS — R350 Frequency of micturition: Secondary | ICD-10-CM

## 2023-12-26 DIAGNOSIS — N393 Stress incontinence (female) (male): Secondary | ICD-10-CM

## 2023-12-26 DIAGNOSIS — N3281 Overactive bladder: Secondary | ICD-10-CM

## 2023-12-26 MED ORDER — TRAMADOL HCL 50 MG PO TABS: 50.0000 mg | ORAL_TABLET | Freq: Three times a day (TID) | ORAL | 0 refills | Status: AC | PRN

## 2023-12-26 MED ORDER — IBUPROFEN 600 MG PO TABS: 600.0000 mg | ORAL_TABLET | Freq: Four times a day (QID) | ORAL | 0 refills | Status: DC | PRN

## 2023-12-26 MED ORDER — POLYETHYLENE GLYCOL 3350 17 GM/SCOOP PO POWD
17.0000 g | Freq: Every day | ORAL | 0 refills | Status: DC
Start: 2023-12-26 — End: 2024-02-23

## 2023-12-26 MED ORDER — ACETAMINOPHEN 500 MG PO TABS: 500.0000 mg | ORAL_TABLET | Freq: Four times a day (QID) | ORAL | 0 refills | Status: DC | PRN

## 2023-12-26 MED ORDER — VIBEGRON 75 MG PO TABS: 75.0000 mg | ORAL_TABLET | Freq: Every day | ORAL | 5 refills | Status: DC

## 2023-12-26 NOTE — Progress Notes (Signed)
 [facility] Pre-Operative Exam  Subjective Chief Complaint: Amy Harrington presents for a preoperative encounter.   History of Present Illness: Amy Harrington is a 73 y.o. female who presents for preoperative visit.  She is scheduled to undergo Exam under anesthesia, Total vaginal hysterectomy with bilateral salpingo-oophorectomy, colpocleisis, perineorrhaphy, midurethral sling, cystoscopy  on 01/10/24.  Her symptoms include pelvic organ prolapse and stress urinary incontinence, and she was was found to have Stage II anterior, Stage II posterior, Stage I apical prolapse.   Urodynamics showed: 1. Sensation was normal; capacity was normal 2. Stress Incontinence was demonstrated at normal pressures; 3. 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 4. Emptying was dysfunctional with a normal PVR, a sustained detrusor contraction present,  abdominal straining not present, dyssynergic urethral sphincter activity on EMG.  Past Medical History:  Diagnosis Date   Breast cancer    GERD (gastroesophageal reflux disease)    Plantar fasciitis    PONV (postoperative nausea and vomiting)      Past Surgical History:  Procedure Laterality Date   BREAST LUMPECTOMY WITH RADIOACTIVE SEED AND SENTINEL LYMPH NODE BIOPSY Right 09/07/2019   Procedure: RIGHT BREAST LUMPECTOMY WITH RADIOACTIVE SEED AND SENTINEL LYMPH NODE BIOPSY;  Surgeon: Almond Lint, MD;  Location: [facility];  Service: General;  Laterality: Right;   HERNIA REPAIR     PORT-A-CATH REMOVAL N/A 09/16/2020   Procedure: REMOVAL PORT-A-CATH;  Surgeon: Almond Lint, MD;  Location: [facility];  Service: General;  Laterality: N/A;   PORTACATH PLACEMENT Left 09/07/2019   Procedure: INSERTION PORT-A-CATH WITH ULTRASOUND;  Surgeon: Almond Lint, MD;  Location: [facility];  Service: General;  Laterality: Left;    is allergic to anastrozole and letrozole.   Family History  Problem  Relation Age of Onset   Parkinson's disease Mother    Stomach cancer Father 24    Social History   Tobacco Use   Smoking status: Never   Smokeless tobacco: Never  Vaping Use   Vaping status: Never Used  Substance Use Topics   Alcohol use: Not Currently   Drug use: Never     Review of Systems was negative for a full 10 system review except as noted in the History of Present Illness.   Current Outpatient Medications:    acetaminophen (TYLENOL) 500 MG tablet, Take 1 tablet (500 mg total) by mouth every 6 (six) hours as needed (pain)., Disp: 30 tablet, Rfl: 0   esomeprazole (NEXIUM) 20 MG capsule, Take 20 mg by mouth daily at 12 noon., Disp: , Rfl:    estradiol (ESTRACE) 0.1 MG/GM vaginal cream, Place 0.5 g vaginally 2 (two) times a week. Place 0.5g nightly for two weeks then twice a week after, Disp: 42.5 g, Rfl: 11   ibuprofen (ADVIL) 600 MG tablet, Take 1 tablet (600 mg total) by mouth every 6 (six) hours as needed., Disp: 30 tablet, Rfl: 0   polyethylene glycol powder (GLYCOLAX/MIRALAX) 17 GM/SCOOP powder, Take 17 g by mouth daily. Drink 17g (1 scoop) dissolved in water per day., Disp: 255 g, Rfl: 0   tamoxifen (NOLVADEX) 20 MG tablet, TAKE 1 TABLET EVERY DAY, Disp: 90 tablet, Rfl: 3   traMADol (ULTRAM) 50 MG tablet, Take 1 tablet (50 mg total) by mouth every 8 (eight) hours as needed for up to 5 days., Disp: 15 tablet, Rfl: 0   Vibegron 75 MG TABS, Take 1 tablet (75 mg total) by mouth daily., Disp: 30 tablet, Rfl: 5   Objective  Vitals:   12/26/23 1439  BP: 118/76  Pulse: 76    Gen: NAD CV: S1 S2 RRR Lungs: Clear to auscultation bilaterally Abd: soft, nontender   Previous Pelvic Exam showed: POP-Q   -1                                            Aa   -1                                           Ba   -4                                              C    4                                            Gh   4.5                                            Pb   8                                             tvl    0                                            Ap   0                                            Bp   -6.5                                     Pessary was removed and cleaned during today's appointment and replaced.     Assessment/ Plan  Assessment: The patient is a 73 y.o. year old scheduled to undergo Exam under anesthesia, Total vaginal hysterectomy with bilateral salpingo-oophorectomy, colpocleisis, perineorrhaphy, midurethral sling, cystoscopy. Verbal consent was obtained for these procedures.  Plan: General Surgical Consent: The patient has previously been counseled on alternative treatments, and the decision by the patient and provider was to proceed with the procedure listed above.  For all procedures, there are risks of bleeding, infection, damage to surrounding organs including but not limited to bowel, bladder, blood vessels, ureters and nerves, and need for further surgery if an injury were to occur. These risks are all low with minimally invasive surgery.   There are risks of numbness and weakness at any body site or buttock/rectal pain.  It is possible  that baseline pain can be worsened by surgery, either with or without mesh. If surgery is vaginal, there is also a low risk of possible conversion to laparoscopy or open abdominal incision where indicated. Very rare risks include blood transfusion, blood clot, heart attack, pneumonia, or death.   There is also a risk of short-term postoperative urinary retention with need to use a catheter. About half of patients need to go home from surgery with a catheter, which is then later removed in the office. The risk of long-term need for a catheter is very low. There is also a risk of worsening of overactive bladder.   Sling: The effectiveness of a midurethral vaginal mesh sling is approximately 85%, and thus, there will be times when you may leak urine after surgery, especially if your  bladder is full or if you have a strong cough. There is a balance between making the sling tight enough to treat your leakage but not too tight so that you have long-term difficulty emptying your bladder. A mesh sling will not directly treat overactive bladder/urge incontinence and may worsen it.  There is an FDA safety notification on vaginal mesh procedures for prolapse but NOT mesh slings. We have extensive experience and training with mesh placement and we have close postoperative follow up to identify any potential complications from mesh. It is important to realize that this mesh is a permanent implant that cannot be easily removed. There are rare risks of mesh exposure (2-4%), pain with intercourse (0-7%), and infection (<1%). The risk of mesh exposure if more likely in a woman with risks for poor healing (prior radiation, poorly controlled diabetes, or immunocompromised). The risk of new or worsened chronic pain after mesh implant is more common in women with baseline chronic pain and/or poorly controlled anxiety or depression. Approximately 2-4% of patients will experience longer-term post-operative voiding dysfunction that may require surgical revision of the sling. We also reviewed that postoperatively, her stream may not be as strong as before surgery.   Prolapse (with or without mesh): Risk factors for surgical failure  include things that put pressure on your pelvis and the surgical repair, including obesity, chronic cough, and heavy lifting or straining (including lifting children or adults, straining on the toilet, or lifting heavy objects such as furniture or anything weighing >25 lbs. Risks of recurrence is 20-30% with vaginal native tissue repair and a less than 10% with sacrocolpopexy with mesh.    We discussed consent for blood products. Risks for blood transfusion include allergic reactions, other reactions that can affect different body organs and managed accordingly, transmission of  infectious diseases such as HIV or Hepatitis. However, the blood is screened. Patient consents for blood products.  Pre-operative instructions:  She was instructed to not take Aspirin/NSAIDs x 7days prior to surgery. Antibiotic prophylaxis was ordered as indicated.  Catheter use: Patient will go home with foley if needed after post-operative voiding trial.  Post-operative instructions:  She was provided with specific post-operative instructions, including precautions and signs/symptoms for which we would recommend contacting us, in addition to daytime and after-hours contact phone numbers. This was provided on a handout.   Post-operative medications: Prescriptions for motrin, tylenol, miralax, and tramadol were sent to her pharmacy. Discussed using ibuprofen and tylenol on a schedule to limit use of narcotics.   Laboratory testing:  We will check labs: CBC and Type and Screen.   Preoperative clearance:  She does not require surgical clearance.    Post-operative follow-up:  A post-operative  appointment will be made for 6 weeks from the date of surgery. If she needs a post-operative nurse visit for a voiding trial, that will be set up after she leaves the hospital.    Patient is also having significant leakage with OAB symptoms. Patient to start Gemtesa 75mg  daily for OAB. She is not a candidate for Myrbetriq due to interactions with Tamoxifen and with her age she is not a candidate for anticholinergics.   I have also placed a referral for after surgery pelvic floor PT due to the hysterectomy and her desire to improve her core strength and mobility after surgery.  Patient will call the clinic or use MyChart should anything change or any new issues arise.   Selmer Dominion, NP

## 2023-12-26 NOTE — H&P (Signed)
 [facility] Pre-Operative Exam  Subjective Chief Complaint: Amy Harrington presents for a preoperative encounter.   History of Present Illness: Amy Harrington is a 73 y.o. female who presents for preoperative visit.  She is scheduled to undergo Exam under anesthesia, Total vaginal hysterectomy with bilateral salpingo-oophorectomy, colpocleisis, perineorrhaphy, midurethral sling, cystoscopy  on 01/10/24.  Her symptoms include pelvic organ prolapse and stress urinary incontinence, and she was was found to have Stage II anterior, Stage II posterior, Stage I apical prolapse.   Urodynamics showed: 1. Sensation was normal; capacity was normal 2. Stress Incontinence was demonstrated at normal pressures; 3. 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 4. Emptying was dysfunctional with a normal PVR, a sustained detrusor contraction present,  abdominal straining not present, dyssynergic urethral sphincter activity on EMG.  Past Medical History:  Diagnosis Date   Breast cancer    GERD (gastroesophageal reflux disease)    Plantar fasciitis    PONV (postoperative nausea and vomiting)      Past Surgical History:  Procedure Laterality Date   BREAST LUMPECTOMY WITH RADIOACTIVE SEED AND SENTINEL LYMPH NODE BIOPSY Right 09/07/2019   Procedure: RIGHT BREAST LUMPECTOMY WITH RADIOACTIVE SEED AND SENTINEL LYMPH NODE BIOPSY;  Surgeon: Almond Lint, MD;  Location: [facility];  Service: General;  Laterality: Right;   HERNIA REPAIR     PORT-A-CATH REMOVAL N/A 09/16/2020   Procedure: REMOVAL PORT-A-CATH;  Surgeon: Almond Lint, MD;  Location: [facility];  Service: General;  Laterality: N/A;   PORTACATH PLACEMENT Left 09/07/2019   Procedure: INSERTION PORT-A-CATH WITH ULTRASOUND;  Surgeon: Almond Lint, MD;  Location: [facility];  Service: General;  Laterality: Left;    is allergic to anastrozole and letrozole.   Family History  Problem  Relation Age of Onset   Parkinson's disease Mother    Stomach cancer Father 21    Social History   Tobacco Use   Smoking status: Never   Smokeless tobacco: Never  Vaping Use   Vaping status: Never Used  Substance Use Topics   Alcohol use: Not Currently   Drug use: Never     Review of Systems was negative for a full 10 system review except as noted in the History of Present Illness.  No current facility-administered medications for this encounter.  Current Outpatient Medications:    acetaminophen (TYLENOL) 500 MG tablet, Take 1 tablet (500 mg total) by mouth every 6 (six) hours as needed (pain)., Disp: 30 tablet, Rfl: 0   esomeprazole (NEXIUM) 20 MG capsule, Take 20 mg by mouth daily at 12 noon., Disp: , Rfl:    estradiol (ESTRACE) 0.1 MG/GM vaginal cream, Place 0.5 g vaginally 2 (two) times a week. Place 0.5g nightly for two weeks then twice a week after, Disp: 42.5 g, Rfl: 11   ibuprofen (ADVIL) 600 MG tablet, Take 1 tablet (600 mg total) by mouth every 6 (six) hours as needed., Disp: 30 tablet, Rfl: 0   polyethylene glycol powder (GLYCOLAX/MIRALAX) 17 GM/SCOOP powder, Take 17 g by mouth daily. Drink 17g (1 scoop) dissolved in water per day., Disp: 255 g, Rfl: 0   tamoxifen (NOLVADEX) 20 MG tablet, TAKE 1 TABLET EVERY DAY, Disp: 90 tablet, Rfl: 3   traMADol (ULTRAM) 50 MG tablet, Take 1 tablet (50 mg total) by mouth every 8 (eight) hours as needed for up to 5 days., Disp: 15 tablet, Rfl: 0   Vibegron 75 MG TABS, Take 1 tablet (75 mg total) by mouth daily., Disp:  30 tablet, Rfl: 5   Objective There were no vitals filed for this visit.   Gen: NAD CV: S1 S2 RRR Lungs: Clear to auscultation bilaterally Abd: soft, nontender   Previous Pelvic Exam showed: POP-Q   -1                                            Aa   -1                                           Ba   -4                                              C    4                                            Gh   4.5                                             Pb   8                                            tvl    0                                            Ap   0                                            Bp   -6.5                                         Assessment/ Plan  Assessment: The patient is a 73 y.o. year old scheduled to undergo Exam under anesthesia, Total vaginal hysterectomy with bilateral salpingo-oophorectomy, colpocleisis, perineorrhaphy, midurethral sling, cystoscopy. Verbal consent was obtained for these procedures.

## 2023-12-29 ENCOUNTER — Encounter (HOSPITAL_COMMUNITY): Payer: Self-pay | Admitting: Obstetrics and Gynecology

## 2023-12-29 NOTE — Pre-Procedure Instructions (Signed)
 Surgical Instructions   Your procedure is scheduled on :  Tuesday,  01-10-2024. Report to [facility] Main Entrance "A" at 5:30  A.M., then check in with the Admitting office. Any questions or running late day of surgery: call [phone]  Questions prior to your surgery date: call [phone], Monday-Friday, 8am-4pm. If you experience any cold or flu symptoms such as cough, fever, chills, shortness of breath, etc. between now and your scheduled surgery, please notify your surgeon office.    Remember:  Do not eat any food after midnight the night before your surgery.  You may have clear liquids from midnight night before surgery until 4:30 AM.   You may drink clear liquids until 4:30 AM the morning of your surgery.   Clear liquids allowed are:  Water Carbonated Beverages Clear Tea (may sweeten, no milk, honey, etc.) Black Coffee Only (may sweeten, NO MILK, CREAM OR POWDERED CREAMER of any kind) Sport drinks , like Gatorade NO clear liquids after 4:30 AM day of surgery.  This includes water,  candy,  gum,  or mints.    Take these medicines the morning of surgery with A SIPS OF WATER : Tamoxifen Esomeprazole (nexium) Vibegron   May take these medicines IF NEEDED: none    One week prior to surgery, STOP taking any Aspirin (unless otherwise instructed by your surgeon) Aleve, Naproxen, Ibuprofen, Motrin, Advil, Goody's, BC's, all herbal medications, fish oil, and non-prescription vitamins.   You will be asked to remove any contacts, glasses, piercing's, hearing aid's, dentures/partials prior to surgery. Please bring cases for these items if needed.    Patients discharged the day of surgery will not be allowed to drive home, and someone needs to stay with them for 24 hours.  SURGICAL WAITING ROOM VISITATION Patients may have no more than 2 support people in the waiting area - these visitors may rotate.   Pre-op nurse will coordinate an appropriate time for 1 ADULT support  person, who may not rotate, to accompany patient in pre-op.  Children under the age of 24 must have an adult with them who is not the patient and must remain in the main waiting area with an adult.  If the patient needs to stay at the hospital during part of their recovery, the visitor guidelines for inpatient rooms apply.  Please refer to the [facility] website for the visitor guidelines for any additional information.   If you received a COVID test during your pre-op visit  it is requested that you wear a mask when out in public, stay away from anyone that may not be feeling well and notify your surgeon if you develop symptoms. If you have been in contact with anyone that has tested positive in the last 10 days please notify you surgeon.      Pre-operative CHG Bathing Instructions   You can play a key role in reducing the risk of infection after surgery. Your skin needs to be as free of germs as possible. You can reduce the number of germs on your skin by washing with CHG (chlorhexidine gluconate) soap before surgery. CHG is an antiseptic soap that kills germs and continues to kill germs even after washing.   DO NOT use if you have an allergy to chlorhexidine/CHG or antibacterial soaps. If your skin becomes reddened or irritated, stop using the CHG and notify Pre-Op nurse day of surgery.  Please get dial soap or other antibacterial soap and shower following the instructions below.  TAKE A SHOWER THE NIGHT BEFORE SURGERY AND THE DAY OF SURGERY    Please keep in mind the following:  DO NOT shave, including legs and underarms, 48 hours prior to surgery.   You may shave your face before/day of surgery.  Place clean sheets on your bed the night before surgery Use a clean washcloth (not used since being washed) for each shower. DO NOT sleep with pet's night before surgery.  CHG Shower Instructions:  Wash your face and private area with normal soap. If you choose to wash your  hair, wash first with your normal shampoo.  After you use shampoo/soap, rinse your hair and body thoroughly to remove shampoo/soap residue.  Turn the water OFF and apply half the bottle of CHG soap to a CLEAN washcloth.  Apply CHG soap ONLY FROM YOUR NECK DOWN TO YOUR TOES (washing for 3-5 minutes)  DO NOT use CHG soap on face, private areas, open wounds, or sores.  Pay special attention to the area where your surgery is being performed.  If you are having back surgery, having someone wash your back for you may be helpful. Wait 2 minutes after CHG soap is applied, then you may rinse off the CHG soap.  Pat dry with a clean towel  Put on clean pajamas    Additional instructions for the day of surgery: DO NOT APPLY any lotions, oils, deodorants (may use underarm deodorant) , cologne/ perfumes or makeup  Do not wear jewelry / piercing's/ metal/  permanent jewelry must be removed prior to arrival day of surgery (this include plastic) Do not wear nail polish, gel polish, artificial nails, or any other type of covering on natural finger nails (toe nails are okay) Do not bring valuables to the hospital. [facility] is not responsible for valuables/personal belongings. Put on clean/comfortable clothes.  Please brush your teeth.  Ask your nurse before applying any prescription medications to the skin.

## 2023-12-29 NOTE — Progress Notes (Signed)
 Spoke w/ via phone for pre-op interview--- pt Lab needs dos----  no       Lab results------ lab appt 01-03-2024 @ 1000 getting CBC/ T&S COVID test -----patient states asymptomatic no test needed Arrive at ------- 0530 on 01-10-2024 NPO after MN NO Solid Food.  Clear liquids from MN until--- 0430 Pre-Surgery Ensure or G2: n/a ERAS protocol:  yes  Med rec completed Medications to take morning of surgery ----- tamoxifen, nexium, vibegron Diabetic medication ----- n/a  GLP1 agonist last dose: n/a GLP1 instructions:  Patient instructed no nail polish to be worn day of surgery Patient instructed to bring photo id and insurance card day of surgery Patient aware to have Driver (ride )--  sister-n-law, Amy Harrington /  caregiver for 24 hours after surgery - daughter,  Amy Harrington Patient Special Instructions -----  will pick up bag w/ CHG and written instructions at lab appt Pre-Op special Instructions -----  Pt requested that surgeon call daughter after surgery and the recovery nurse daughter with discharge instructions.   Patient verbalized understanding of instructions that were given at this phone interview. Patient denies chest pain, sob, fever, cough at the interview.

## 2024-01-02 ENCOUNTER — Encounter (INDEPENDENT_AMBULATORY_CARE_PROVIDER_SITE_OTHER): Payer: Self-pay

## 2024-01-03 ENCOUNTER — Encounter (HOSPITAL_COMMUNITY)
Admission: RE | Admit: 2024-01-03 | Discharge: 2024-01-03 | Disposition: A | Discharge status: Home / Self Care | Source: Ambulatory Visit | Attending: Physician Assistant | Admitting: Physician Assistant

## 2024-01-03 DIAGNOSIS — Z01812 Encounter for preprocedural laboratory examination: Secondary | ICD-10-CM | POA: Insufficient documentation

## 2024-01-03 DIAGNOSIS — Z01818 Encounter for other preprocedural examination: Secondary | ICD-10-CM

## 2024-01-03 LAB — CBC
HCT: 40.7 % (ref 36.0–46.0)
Hemoglobin: 13.2 g/dL (ref 12.0–15.0)
MCH: 30.9 pg (ref 26.0–34.0)
MCHC: 32.4 g/dL (ref 30.0–36.0)
MCV: 95.3 fL (ref 80.0–100.0)
Platelets: 190 10*3/uL (ref 150–400)
RBC: 4.27 MIL/uL (ref 3.87–5.11)
RDW: 13.2 % (ref 11.5–15.5)
WBC: 6.3 10*3/uL (ref 4.0–10.5)
nRBC: 0 % (ref 0.0–0.2)

## 2024-01-03 LAB — TYPE AND SCREEN
ABO/RH(D): O POS
Antibody Screen: NEGATIVE

## 2024-01-09 NOTE — Anesthesia Preprocedure Evaluation (Addendum)
 Anesthesia Evaluation  Patient identified by MRN, date of birth, ID band Patient awake    Reviewed: Allergy & Precautions, NPO status , Patient's Chart, lab work & pertinent test results  History of Anesthesia Complications (+) PONV and history of anesthetic complications  Airway Mallampati: III  TM Distance: >3 FB Neck ROM: Full    Dental no notable dental hx. (+) Teeth Intact, Dental Advisory Given   Pulmonary neg pulmonary ROS   Pulmonary exam normal breath sounds clear to auscultation       Cardiovascular Normal cardiovascular exam Rhythm:Regular Rate:Normal  TTE 2021  1. Left ventricular ejection fraction, by estimation, is 60 to 65%. The  left ventricle has normal function. The left ventricle has no regional  wall motion abnormalities. There is mild left ventricular hypertrophy of  the basal-septal segment. Left  ventricular diastolic parameters are consistent with Grade I diastolic  dysfunction (impaired relaxation). The average left ventricular global  longitudinal strain is -21.6 %.   2. Right ventricular systolic function is normal. The right ventricular  size is normal. There is normal pulmonary artery systolic pressure.   3. Left atrial size was mildly dilated.   4. The mitral valve is normal in structure. Trivial mitral valve  regurgitation.   5. The aortic valve is normal in structure. Aortic valve regurgitation is  trivial.   6. The inferior vena cava is normal in size with greater than 50%  respiratory variability, suggesting right atrial pressure of 3 mmHg.     Neuro/Psych negative neurological ROS  negative psych ROS   GI/Hepatic Neg liver ROS,GERD  ,,  Endo/Other  negative endocrine ROS    Renal/GU negative Renal ROS  negative genitourinary   Musculoskeletal negative musculoskeletal ROS (+)    Abdominal   Peds  Hematology negative hematology ROS (+)   Anesthesia Other Findings    Reproductive/Obstetrics                             Anesthesia Physical Anesthesia Plan  ASA: 2  Anesthesia Plan: General   Post-op Pain Management: Tylenol  PO (pre-op)* and Dilaudid  IV   Induction: Intravenous  PONV Risk Score and Plan: 4 or greater and Dexamethasone , Ondansetron , Treatment may vary due to age or medical condition and Diphenhydramine   Airway Management Planned: Oral ETT  Additional Equipment:   Intra-op Plan:   Post-operative Plan: Extubation in OR  Informed Consent: I have reviewed the patients History and Physical, chart, labs and discussed the procedure including the risks, benefits and alternatives for the proposed anesthesia with the patient or authorized representative who has indicated his/her understanding and acceptance.     Dental advisory given  Plan Discussed with: CRNA  Anesthesia Plan Comments: (2 IVs)       Anesthesia Quick Evaluation

## 2024-01-10 ENCOUNTER — Other Ambulatory Visit: Payer: Self-pay

## 2024-01-10 ENCOUNTER — Encounter (HOSPITAL_COMMUNITY)
Admission: RE | Disposition: A | Discharge status: Home / Self Care | Payer: Self-pay | Source: Home / Self Care | Attending: Obstetrics and Gynecology

## 2024-01-10 ENCOUNTER — Telehealth: Payer: Self-pay | Admitting: Obstetrics and Gynecology

## 2024-01-10 ENCOUNTER — Ambulatory Visit (HOSPITAL_COMMUNITY): Admitting: Anesthesiology

## 2024-01-10 ENCOUNTER — Ambulatory Visit (HOSPITAL_BASED_OUTPATIENT_CLINIC_OR_DEPARTMENT_OTHER): Admitting: Anesthesiology

## 2024-01-10 ENCOUNTER — Ambulatory Visit (HOSPITAL_COMMUNITY)
Admission: RE | Admit: 2024-01-10 | Discharge: 2024-01-10 | Disposition: A | Discharge status: Home / Self Care | Attending: Obstetrics and Gynecology | Admitting: Obstetrics and Gynecology

## 2024-01-10 ENCOUNTER — Encounter (HOSPITAL_COMMUNITY): Payer: Self-pay | Admitting: Obstetrics and Gynecology

## 2024-01-10 DIAGNOSIS — N840 Polyp of corpus uteri: Secondary | ICD-10-CM | POA: Insufficient documentation

## 2024-01-10 DIAGNOSIS — N393 Stress incontinence (female) (male): Secondary | ICD-10-CM

## 2024-01-10 DIAGNOSIS — N888 Other specified noninflammatory disorders of cervix uteri: Secondary | ICD-10-CM | POA: Diagnosis not present

## 2024-01-10 DIAGNOSIS — K219 Gastro-esophageal reflux disease without esophagitis: Secondary | ICD-10-CM | POA: Insufficient documentation

## 2024-01-10 DIAGNOSIS — N9489 Other specified conditions associated with female genital organs and menstrual cycle: Secondary | ICD-10-CM | POA: Insufficient documentation

## 2024-01-10 DIAGNOSIS — I517 Cardiomegaly: Secondary | ICD-10-CM | POA: Insufficient documentation

## 2024-01-10 DIAGNOSIS — N812 Incomplete uterovaginal prolapse: Secondary | ICD-10-CM

## 2024-01-10 DIAGNOSIS — Z01818 Encounter for other preprocedural examination: Secondary | ICD-10-CM

## 2024-01-10 HISTORY — PX: BLADDER SUSPENSION: SHX72

## 2024-01-10 HISTORY — DX: Personal history of irradiation: Z92.3

## 2024-01-10 HISTORY — DX: Overactive bladder: N32.81

## 2024-01-10 HISTORY — PX: COLPOCLEISIS: SHX6814

## 2024-01-10 HISTORY — DX: Presence of urogenital implants: Z96.0

## 2024-01-10 HISTORY — DX: Personal history of antineoplastic chemotherapy: Z92.21

## 2024-01-10 HISTORY — PX: VAGINAL HYSTERECTOMY: SHX2639

## 2024-01-10 HISTORY — DX: Stress incontinence (female) (male): N39.3

## 2024-01-10 HISTORY — PX: RECTOCELE REPAIR: SHX761

## 2024-01-10 HISTORY — PX: CYSTOSCOPY: SHX5120

## 2024-01-10 HISTORY — DX: Female genital prolapse, unspecified: N81.9

## 2024-01-10 LAB — ABO/RH: ABO/RH(D): O POS

## 2024-01-10 SURGERY — HYSTERECTOMY, VAGINAL
Anesthesia: General | Site: Vagina

## 2024-01-10 MED ORDER — CHLORHEXIDINE GLUCONATE 0.12 % MT SOLN
OROMUCOSAL | Status: AC
  Filled 2024-01-10: qty 15

## 2024-01-10 MED ORDER — ORAL CARE MOUTH RINSE: 15.0000 mL | Freq: Once | OROMUCOSAL | Status: AC

## 2024-01-10 MED ORDER — ROCURONIUM BROMIDE 10 MG/ML (PF) SYRINGE
PREFILLED_SYRINGE | INTRAVENOUS | Status: DC | PRN
  Administered 2024-01-10: 50 mg via INTRAVENOUS
  Administered 2024-01-10 (×3): 10 mg via INTRAVENOUS
  Administered 2024-01-10: 20 mg via INTRAVENOUS

## 2024-01-10 MED ORDER — ACETAMINOPHEN 500 MG PO TABS
ORAL_TABLET | ORAL | Status: AC
  Filled 2024-01-10: qty 2

## 2024-01-10 MED ORDER — GABAPENTIN 300 MG PO CAPS
300.0000 mg | ORAL_CAPSULE | ORAL | Status: AC
  Administered 2024-01-10: 300 mg via ORAL

## 2024-01-10 MED ORDER — CHLORHEXIDINE GLUCONATE 0.12 % MT SOLN
15.0000 mL | Freq: Once | OROMUCOSAL | Status: AC
  Administered 2024-01-10: 15 mL via OROMUCOSAL

## 2024-01-10 MED ORDER — SODIUM CHLORIDE (PF) 0.9 % IJ SOLN
INTRAMUSCULAR | Status: AC
  Filled 2024-01-10: qty 40

## 2024-01-10 MED ORDER — PHENYLEPHRINE 80 MCG/ML (10ML) SYRINGE FOR IV PUSH (FOR BLOOD PRESSURE SUPPORT)
PREFILLED_SYRINGE | INTRAVENOUS | Status: DC | PRN
Start: 2024-01-10 — End: 2024-01-10
  Administered 2024-01-10: 120 ug via INTRAVENOUS
  Administered 2024-01-10: 160 ug via INTRAVENOUS
  Administered 2024-01-10: 80 ug via INTRAVENOUS

## 2024-01-10 MED ORDER — ONDANSETRON HCL 4 MG/2ML IJ SOLN
INTRAMUSCULAR | Status: AC
  Filled 2024-01-10: qty 2

## 2024-01-10 MED ORDER — METRONIDAZOLE 500 MG/100ML IV SOLN
500.0000 mg | Freq: Once | INTRAVENOUS | Status: AC
  Administered 2024-01-10: 500 mg via INTRAVENOUS

## 2024-01-10 MED ORDER — PROPOFOL 10 MG/ML IV BOLUS
INTRAVENOUS | Status: AC
  Filled 2024-01-10: qty 20

## 2024-01-10 MED ORDER — OXYCODONE HCL 5 MG PO TABS: 5.0000 mg | ORAL_TABLET | Freq: Once | ORAL | Status: DC | PRN

## 2024-01-10 MED ORDER — PROPOFOL 10 MG/ML IV BOLUS
INTRAVENOUS | Status: DC | PRN
  Administered 2024-01-10: 130 mg via INTRAVENOUS

## 2024-01-10 MED ORDER — PHENYLEPHRINE 80 MCG/ML (10ML) SYRINGE FOR IV PUSH (FOR BLOOD PRESSURE SUPPORT)
PREFILLED_SYRINGE | INTRAVENOUS | Status: AC
  Filled 2024-01-10: qty 10

## 2024-01-10 MED ORDER — LIDOCAINE 2% (20 MG/ML) 5 ML SYRINGE
INTRAMUSCULAR | Status: DC | PRN
  Administered 2024-01-10: 60 mg via INTRAVENOUS

## 2024-01-10 MED ORDER — AMISULPRIDE (ANTIEMETIC) 5 MG/2ML IV SOLN
10.0000 mg | Freq: Once | INTRAVENOUS | Status: AC | PRN
  Administered 2024-01-10: 10 mg via INTRAVENOUS

## 2024-01-10 MED ORDER — FENTANYL CITRATE (PF) 250 MCG/5ML IJ SOLN
INTRAMUSCULAR | Status: DC | PRN
  Administered 2024-01-10 (×3): 50 ug via INTRAVENOUS
  Administered 2024-01-10: 100 ug via INTRAVENOUS

## 2024-01-10 MED ORDER — GABAPENTIN 300 MG PO CAPS
ORAL_CAPSULE | ORAL | Status: AC
  Filled 2024-01-10: qty 1

## 2024-01-10 MED ORDER — SUGAMMADEX SODIUM 200 MG/2ML IV SOLN
INTRAVENOUS | Status: DC | PRN
  Administered 2024-01-10: 200 mg via INTRAVENOUS

## 2024-01-10 MED ORDER — SODIUM CHLORIDE 0.9 % IR SOLN
Status: DC | PRN
  Administered 2024-01-10: 1000 mL via INTRAVESICAL

## 2024-01-10 MED ORDER — MIDAZOLAM HCL 2 MG/2ML IJ SOLN
INTRAMUSCULAR | Status: DC | PRN
  Administered 2024-01-10: 2 mg via INTRAVENOUS

## 2024-01-10 MED ORDER — OXYCODONE HCL 5 MG/5ML PO SOLN: 5.0000 mg | Freq: Once | ORAL | Status: DC | PRN

## 2024-01-10 MED ORDER — CEFAZOLIN SODIUM-DEXTROSE 2-4 GM/100ML-% IV SOLN
2.0000 g | INTRAVENOUS | Status: AC
  Administered 2024-01-10: 2 g via INTRAVENOUS

## 2024-01-10 MED ORDER — AMISULPRIDE (ANTIEMETIC) 5 MG/2ML IV SOLN
INTRAVENOUS | Status: AC
  Filled 2024-01-10: qty 4

## 2024-01-10 MED ORDER — KETAMINE HCL 50 MG/5ML IJ SOSY
PREFILLED_SYRINGE | INTRAMUSCULAR | Status: AC
  Filled 2024-01-10: qty 5

## 2024-01-10 MED ORDER — FENTANYL CITRATE (PF) 250 MCG/5ML IJ SOLN
INTRAMUSCULAR | Status: AC
  Filled 2024-01-10: qty 5

## 2024-01-10 MED ORDER — 0.9 % SODIUM CHLORIDE (POUR BTL) OPTIME
TOPICAL | Status: DC | PRN
  Administered 2024-01-10: 1000 mL

## 2024-01-10 MED ORDER — METRONIDAZOLE 500 MG/100ML IV SOLN
INTRAVENOUS | Status: AC
Start: 2024-01-10 — End: 2024-01-10
  Filled 2024-01-10: qty 100

## 2024-01-10 MED ORDER — DEXAMETHASONE SODIUM PHOSPHATE 10 MG/ML IJ SOLN
INTRAMUSCULAR | Status: DC | PRN
  Administered 2024-01-10: 10 mg via INTRAVENOUS

## 2024-01-10 MED ORDER — DEXAMETHASONE SODIUM PHOSPHATE 10 MG/ML IJ SOLN
INTRAMUSCULAR | Status: AC
  Filled 2024-01-10: qty 1

## 2024-01-10 MED ORDER — ONDANSETRON HCL 4 MG/2ML IJ SOLN
INTRAMUSCULAR | Status: DC | PRN
Start: 2024-01-10 — End: 2024-01-10
  Administered 2024-01-10: 4 mg via INTRAVENOUS

## 2024-01-10 MED ORDER — PHENYLEPHRINE HCL-NACL 20-0.9 MG/250ML-% IV SOLN
INTRAVENOUS | Status: DC | PRN
  Administered 2024-01-10: 25 ug/min via INTRAVENOUS

## 2024-01-10 MED ORDER — LIDOCAINE-EPINEPHRINE 1 %-1:100000 IJ SOLN
INTRAMUSCULAR | Status: DC | PRN
  Administered 2024-01-10: 15 mL

## 2024-01-10 MED ORDER — MIDAZOLAM HCL 2 MG/2ML IJ SOLN
INTRAMUSCULAR | Status: AC
  Filled 2024-01-10: qty 2

## 2024-01-10 MED ORDER — ACETAMINOPHEN 500 MG PO TABS
1000.0000 mg | ORAL_TABLET | ORAL | Status: AC
  Administered 2024-01-10: 1000 mg via ORAL

## 2024-01-10 MED ORDER — LIDOCAINE 2% (20 MG/ML) 5 ML SYRINGE
INTRAMUSCULAR | Status: AC
  Filled 2024-01-10: qty 5

## 2024-01-10 MED ORDER — ENOXAPARIN SODIUM 40 MG/0.4ML IJ SOSY
PREFILLED_SYRINGE | INTRAMUSCULAR | Status: AC
  Filled 2024-01-10: qty 0.4

## 2024-01-10 MED ORDER — LIDOCAINE-EPINEPHRINE 1 %-1:100000 IJ SOLN
INTRAMUSCULAR | Status: AC
  Filled 2024-01-10: qty 1

## 2024-01-10 MED ORDER — ROCURONIUM BROMIDE 10 MG/ML (PF) SYRINGE
PREFILLED_SYRINGE | INTRAVENOUS | Status: AC
  Filled 2024-01-10: qty 10

## 2024-01-10 MED ORDER — PHENAZOPYRIDINE HCL 100 MG PO TABS
200.0000 mg | ORAL_TABLET | ORAL | Status: AC
  Administered 2024-01-10: 200 mg via ORAL

## 2024-01-10 MED ORDER — ACETAMINOPHEN 500 MG PO TABS
1000.0000 mg | ORAL_TABLET | Freq: Once | ORAL | Status: AC
  Administered 2024-01-10: 1000 mg via ORAL
  Filled 2024-01-10: qty 2

## 2024-01-10 MED ORDER — ENOXAPARIN SODIUM 40 MG/0.4ML IJ SOSY
40.0000 mg | PREFILLED_SYRINGE | INTRAMUSCULAR | Status: AC
  Administered 2024-01-10: 40 mg via SUBCUTANEOUS

## 2024-01-10 MED ORDER — PHENAZOPYRIDINE HCL 100 MG PO TABS
ORAL_TABLET | ORAL | Status: AC
  Filled 2024-01-10: qty 2

## 2024-01-10 MED ORDER — FENTANYL CITRATE (PF) 100 MCG/2ML IJ SOLN: 25.0000 ug | INTRAMUSCULAR | Status: DC | PRN

## 2024-01-10 MED ORDER — HYDROMORPHONE HCL 1 MG/ML IJ SOLN
INTRAMUSCULAR | Status: AC
  Filled 2024-01-10: qty 0.5

## 2024-01-10 MED ORDER — LACTATED RINGERS IV SOLN: INTRAVENOUS | Status: DC

## 2024-01-10 MED ORDER — CEFAZOLIN SODIUM-DEXTROSE 2-4 GM/100ML-% IV SOLN
INTRAVENOUS | Status: AC
  Filled 2024-01-10: qty 100

## 2024-01-10 MED ORDER — SODIUM CHLORIDE (PF) 0.9 % IJ SOLN
INTRAMUSCULAR | Status: DC | PRN
  Administered 2024-01-10: 25 mL

## 2024-01-10 SURGICAL SUPPLY — 52 items
BAG COUNTER SPONGE SURGICOUNT (BAG) ×2 IMPLANT
BLADE CLIPPER SENSICLIP SURGIC (BLADE) ×2 IMPLANT
BLADE SURG 15 STRL LF DISP TIS (BLADE) ×2 IMPLANT
CANISTER SUCT 3000ML PPV (MISCELLANEOUS) ×2 IMPLANT
DERMABOND ADVANCED .7 DNX12 (GAUZE/BANDAGES/DRESSINGS) ×2 IMPLANT
DEVICE CAPIO SLIM SINGLE (INSTRUMENTS) IMPLANT
ELECTRODE REM PT RTRN 9FT ADLT (ELECTROSURGICAL) IMPLANT
GAUZE 4X4 16PLY ~~LOC~~+RFID DBL (SPONGE) ×2 IMPLANT
GLOVE BIO SURGEON STRL SZ 6 (GLOVE) ×2 IMPLANT
GLOVE BIOGEL PI IND STRL 6.5 (GLOVE) ×2 IMPLANT
GLOVE ECLIPSE 6.0 STRL STRAW (GLOVE) ×2 IMPLANT
GLOVE SURG UNDER POLY LF SZ7 (GLOVE) ×2 IMPLANT
GOWN STRL REUS W/ TWL LRG LVL3 (GOWN DISPOSABLE) ×8 IMPLANT
GOWN STRL REUS W/TWL LRG LVL3 (GOWN DISPOSABLE) ×2 IMPLANT
HIBICLENS CHG 4% 4OZ (MISCELLANEOUS) ×2 IMPLANT
HIBICLENS CHG 4% 4OZ BTL (MISCELLANEOUS) ×2 IMPLANT
HOLDER FOLEY CATH W/STRAP (MISCELLANEOUS) ×2 IMPLANT
IV NS 1000ML BAXH (IV SOLUTION) IMPLANT
KIT TURNOVER KIT B (KITS) ×2 IMPLANT
LIGASURE IMPACT 36 18CM CVD LR (INSTRUMENTS) IMPLANT
MANIFOLD NEPTUNE II (INSTRUMENTS) ×2 IMPLANT
NDL HYPO 22X1.5 SAFETY MO (MISCELLANEOUS) ×2 IMPLANT
NEEDLE HYPO 22X1.5 SAFETY MO (MISCELLANEOUS) ×2 IMPLANT
NS IRRIG 1000ML POUR BTL (IV SOLUTION) ×2 IMPLANT
PACK CYSTO (CUSTOM PROCEDURE TRAY) ×2 IMPLANT
PACK VAGINAL WOMENS (CUSTOM PROCEDURE TRAY) ×2 IMPLANT
PAD OB MATERNITY 11 LF (PERSONAL CARE ITEMS) ×2 IMPLANT
RETRACTOR LONE STAR DISPOSABLE (INSTRUMENTS) ×2 IMPLANT
RETRACTOR STAY HOOK 5MM (MISCELLANEOUS) ×2 IMPLANT
SET CYSTO W/LG BORE CLAMP LF (SET/KITS/TRAYS/PACK) ×2 IMPLANT
SET IRRIG Y TYPE TUR BLADDER L (SET/KITS/TRAYS/PACK) ×2 IMPLANT
SLEEVE SCD COMPRESS KNEE MED (STOCKING) ×2 IMPLANT
SLING ADVANTAGE FIT TRANVAG (Sling) IMPLANT
SPIKE FLUID TRANSFER (MISCELLANEOUS) ×2 IMPLANT
SUCTION TUBE FRAZIER 10FR DISP (SUCTIONS) ×2 IMPLANT
SURGIFLO W/THROMBIN 8M KIT (HEMOSTASIS) IMPLANT
SUT ABS MONO DBL WITH NDL 48IN (SUTURE) IMPLANT
SUT MON AB 2-0 SH 27 (SUTURE) IMPLANT
SUT VIC AB 0 CT1 18XCR BRD8 (SUTURE) IMPLANT
SUT VIC AB 0 CT1 27XBRD ANBCTR (SUTURE) IMPLANT
SUT VIC AB 0 CT1 27XBRD ANTBC (SUTURE) ×2 IMPLANT
SUT VIC AB 0 CT1 27XCR 8 STRN (SUTURE) ×2 IMPLANT
SUT VIC AB 0 CT2 27 (SUTURE) IMPLANT
SUT VIC AB 2-0 SH 27XBRD (SUTURE) ×2 IMPLANT
SUT VICRYL 2-0 SH 8X27 (SUTURE) ×2 IMPLANT
SYR 10ML LL (SYRINGE) ×2 IMPLANT
SYR BULB EAR ULCER 3OZ GRN STR (SYRINGE) ×2 IMPLANT
TOWEL GREEN STERILE (TOWEL DISPOSABLE) ×2 IMPLANT
TOWEL GREEN STERILE FF (TOWEL DISPOSABLE) ×4 IMPLANT
TRAY FOLEY W/BAG SLVR 14FR (SET/KITS/TRAYS/PACK) ×2 IMPLANT
TRAY FOLEY W/BAG SLVR 14FR LF (SET/KITS/TRAYS/PACK) ×2 IMPLANT
UNDERPAD 30X36 HEAVY ABSORB (UNDERPADS AND DIAPERS) ×2 IMPLANT

## 2024-01-10 NOTE — Telephone Encounter (Signed)
 Amy Harrington underwent Total vaginal hysterectomy, colpocleisis, levator plication, perineorrhaphy, midurethral sling, cystoscopy on 01/10/24.   She failed her voiding trial.  was backfilled into the bladder Voided  PVR by bladder scan was .   She was discharged with a catheter. Please call her for a routine post op check and to schedule a voiding trial by Thursday or Friday 4/24-25. Thanks!  Arma Lamp, MD

## 2024-01-10 NOTE — Discharge Instructions (Addendum)
 POST OPERATIVE INSTRUCTIONS  General Instructions Recovery (not bed rest) will last approximately 6 weeks Walking is encouraged, but refrain from strenuous exercise/ housework/ heavy lifting. No lifting >10lbs  Nothing in the vagina- NO intercourse, tampons or douching Bathing:  Do not submerge in water (NO swimming, bath, hot tub, etc) until after your postop visit. You can shower starting the day after surgery.  No driving until you are not taking narcotic pain medicine and until your pain is well enough controlled that you can slam on the breaks or make sudden movements if needed.   Taking your medications Please take your acetaminophen  and ibuprofen  on a schedule for the first 48 hours. Take 600mg  ibuprofen , then take 500mg  acetaminophen  3 hours later, then continue to alternate ibuprofen  and acetaminophen . That way you are taking each type of medication every 6 hours. Take the prescribed narcotic (oxycodone , tramadol , etc) as needed, with a maximum being every 4 hours.  Take a stool softener daily to keep your stools soft and preventing you from straining. If you have diarrhea, you decrease your stool softener. This is explained more below. We have prescribed you Miralax .  Reasons to Call the Nurse (see last page for phone numbers) Heavy Bleeding (changing your pad every 1-2 hours) Persistent nausea/vomiting Fever (100.4 degrees or more) Incision problems (pus or other fluid coming out, redness, warmth, increased pain)  Things to Expect After Surgery Mild to Moderate pain is normal during the first day or two after surgery. If prescribed, take Ibuprofen  or Tylenol  first and use the stronger medicine for "break-through" pain. You can overlap these medicines because they work differently.   Constipation   To Prevent Constipation:  Eat a well-balanced diet including protein, grains, fresh fruit and vegetables.  Drink plenty of fluids. Walk regularly.  Depending on specific instructions  from your physician: take Miralax  daily and additionally you can add a stool softener (colace/ docusate) and fiber supplement. Continue as long as you're on pain medications.   To Treat Constipation:  If you do not have a bowel movement in 2 days after surgery, you can take 2 Tbs of Milk of Magnesia 1-2 times a day until you have a bowel movement. If diarrhea occurs, decrease the amount or stop the laxative. If no results with Milk of Magnesia, you can drink a bottle of magnesium citrate which you can purchase over the counter.  Fatigue:  This is a normal response to surgery and will improve with time.  Plan frequent rest periods throughout the day.  Gas Pain:  This is very common but can also be very painful! Drink warm liquids such as herbal teas, bouillon or soup. Walking will help you pass more gas.  Mylicon or Gas-X can be taken over the counter.  Leaking Urine:  Varying amounts of leakage may occur after surgery.  This should improve with time. Your bladder needs at least 3 months to recover from surgery. If you leak after surgery, be sure to mention this to your doctor at your post-op visit. If you were taking medications for overactive bladder prior to surgery, be sure to restart the medications immediately after surgery.  Incisions: If you have incisions on your abdomen, the skin glue will dissolve on its own over time. It is ok to gently rinse with soap and water over these incisions but do not scrub.  Catheter Approximately 50% of patients are unable to urinate after surgery and need to go home with a catheter. This allows your bladder to  rest so it can return to full function. If you go home with a catheter, the office will call to set up a voiding trial a few days after surgery. For most patients, by this visit, they are able to urinate on their own. Long term catheter use is rare.   Return to Work  As work demands and recovery times vary widely, it is hard to predict when you will want  to return to work. If you have a desk job with no strenuous physical activity, and if you would like to return sooner than generally recommended, discuss this with your provider or call our office.   Post op concerns  For non-emergent issues, please call the Urogynecology Nurse. Please leave a message and someone will contact you within one business day.  You can also send a message through MyChart.   AFTER HOURS (After 5:00 PM and on weekends):  For urgent matters that cannot wait until the next business day. Call our office [phone] and connect to the doctor on call.  Please reserve this for important issues.   **FOR ANY TRUE EMERGENCY ISSUES CALL 911 OR GO TO THE NEAREST EMERGENCY ROOM.** Please inform our office or the doctor on call of any emergency.     APPOINTMENTS: Call [phone]   Post Anesthesia Home Care Instructions  Activity: Get plenty of rest for the remainder of the day. A responsible individual must stay with you for 24 hours following the procedure.  For the next 24 hours, DO NOT: -Drive a car -Advertising copywriter -Drink alcoholic beverages -Take any medication unless instructed by your physician -Make any legal decisions or sign important papers.  Meals: Start with liquid foods such as gelatin or soup. Progress to regular foods as tolerated. Avoid greasy, spicy, heavy foods. If nausea and/or vomiting occur, drink only clear liquids until the nausea and/or vomiting subsides. Call your physician if vomiting continues.  Special Instructions/Symptoms: Your throat may feel dry or sore from the anesthesia or the breathing tube placed in your throat during surgery. If this causes discomfort, gargle with warm salt water. The discomfort should disappear within 24 hours.  Dr. Sunnie England office will call you to schedule an appointment to remove the catheter.

## 2024-01-10 NOTE — Transfer of Care (Signed)
 Immediate Anesthesia Transfer of Care Note  Patient: Barth Life  Procedure(s) Performed: HYSTERECTOMY, VAGINAL (Vagina ) COLPOCLEISIS (Vagina ) COLPORRHAPHY, POSTERIOR, FOR RECTOCELE REPAIR (Vagina ) CREATION, URETHRAL SLING, RETROPUBIC APPROACH, USING POLYPROPYLENE TAPE (Vagina ) CYSTOSCOPY (Bladder)  Patient Location: PACU  Anesthesia Type:General  Level of Consciousness: awake and sedated  Airway & Oxygen Therapy: Patient Spontanous Breathing and Patient connected to face mask oxygen  Post-op Assessment: Report given to RN and Post -op Vital signs reviewed and stable  Post vital signs: Reviewed and stable  Last Vitals:  Vitals Value Taken Time  BP 126/55 01/10/24 1115  Temp 36.6 C 01/10/24 1115  Pulse 85 01/10/24 1120  Resp 12 01/10/24 1120  SpO2 98 % 01/10/24 1120  Vitals shown include unfiled device data.  Last Pain:  Vitals:   01/10/24 0609  TempSrc: Oral  PainSc: 0-No pain      Patients Stated Pain Goal: 5 (01/10/24 1610)  Complications: No notable events documented.

## 2024-01-10 NOTE — Interval H&P Note (Signed)
 History and Physical Interval Note:  01/10/2024 7:21 AM  Barth Life  has presented today for surgery, with the diagnosis of uterovaginal prolpase, incomplete; stress urinary incontinence.  The various methods of treatment have been discussed with the patient and family. After consideration of risks, benefits and other options for treatment, the patient has consented to  Procedure(s) with comments: HYSTERECTOMY, VAGINAL (N/A) - With bilateral salpingo-oophorectomy; COLPOCLEISIS (N/A) COLPORRHAPHY, POSTERIOR, FOR RECTOCELE REPAIR (N/A) - -levator plication with perineorrhaphy CREATION, URETHRAL SLING, RETROPUBIC APPROACH, USING POLYPROPYLENE TAPE (N/A) - midrurethral sling CYSTOSCOPY (N/A) as a surgical intervention.  The patient's history has been reviewed, patient examined, no change in status, stable for surgery.  I have reviewed the patient's chart and labs.  Questions were answered to the patient's satisfaction.     Arma Lamp

## 2024-01-10 NOTE — Anesthesia Procedure Notes (Signed)
 Procedure Name: Intubation Date/Time: 01/10/2024 7:44 AM  Performed by: Aniella Wandrey C, CRNAPre-anesthesia Checklist: Patient identified, Emergency Drugs available, Suction available and Patient being monitored Patient Re-evaluated:Patient Re-evaluated prior to induction Oxygen Delivery Method: Circle system utilized Preoxygenation: Pre-oxygenation with 100% oxygen Induction Type: IV induction Ventilation: Mask ventilation without difficulty Laryngoscope Size: Miller and 2 Grade View: Grade II Tube type: Oral Number of attempts: 1 Airway Equipment and Method: Stylet and Oral airway Placement Confirmation: ETT inserted through vocal cords under direct vision, positive ETCO2 and breath sounds checked- equal and bilateral Secured at: 22 cm Tube secured with: Tape Dental Injury: Teeth and Oropharynx as per pre-operative assessment

## 2024-01-10 NOTE — Op Note (Signed)
 Operative Note  Preoperative Diagnosis: anterior vaginal prolapse, posterior vaginal prolapse, uterovaginal prolapse, incomplete, and stress urinary incontinence  Postoperative Diagnosis: same  Procedures performed:  Total vaginal hysterectomy, colpocleisis, levator plication, perineorrhaphy, midurethral sling, cystoscopy  Implants:  Implant Name Type Inv. Item Serial No. Manufacturer Lot No. LRB No. Used Action  Advantage Fit Ultra System Transvaginal Mid Urethral sling     [id number] N/A 1 Implanted    Attending Surgeon: Ollen Beverage, MD  Assistant: Kirstie Percy, PA  Anesthesia: General endotracheal  Findings: 1. On vaginal exam, stage III prolapse present  2. On cystoscopy, normal bladder and urethral mucosa without injury or lesion. Brisk bilateral ureteral efflux present.    Specimens:  ID Type Source Tests Collected by Time Destination  1 : uterus and cervix Tissue PATH Gyn biopsy SURGICAL PATHOLOGY Arma Lamp, MD 01/10/2024 [phone]     Estimated blood loss: 75 mL  IV fluids: 1600 mL  Urine output: 350 mL  Complications: nonw  Procedure in Detail: After informed consent was obtained, the patient was taken to the operating room where she was placed under anesthesia.  She was then placed in the dorsal lithotomy position with Allen stirrups and prepped and draped in the usual sterile fashion.  Care was taken to avoid hyperflexion or hyperextension of her lower extremities.    A self-retaining retractor was placed, and a foley catheter was placed. The cervix was grasped with two tenacula.  The cervix was injected circumferentially with 1% lidocaine  with epinephrine . A 10 blade was used to incise circumferentially around the cervix.  Anteriorly, the bladder was dissected off the pubocervical fascia using Metzenbaum scissors. A Deaver retractor was placed anteriorly to protect the bladder. The posterior vagina was grasped with a Kocher clamp. The Mayo scissors were  used to enter the posterior cul-de-sac. Palpation confirmed peritoneal entry and no adhesions. The posterior peritoneum was affixed to the vaginal cuff with 0-Vicryl (this suture was used throughout unless otherwise specified) at the midline. A right angle retractor was placed through the posterior colpotomy. A Heaney clamp was then used to clamp the uterosacral ligaments on each side. These were cut and suture ligated using 0-Vicryl in a Heaney fashion, and tagged with hemostats. The anterior peritoneal reflection was then identified, tented up and incised with Metzenbaum scissors to create an anterior colpotomy. Palpation confirmed peritoneal entry and no adhesions. The Deaver was placed anteriorly to protect the bladder. The cardinal ligaments were clamped, ligated and cut with the ligasure in a similar fashion on each side. The uterine arteries were also clamped and ligated with the ligasure. The cornua were clamped, cut, free-tied and suture ligated. The uterus and cervix were handed off the field.  Inspection of the pedicles revealed excellent hemostasis. The uterus appeared enlarged for her age so was sent for frozen section, which revealed a benign endometrial polyp.   A lap pad was placed to retract the bowel. The fallopian tubes and ovaries were not well visualized so decision was made to leave them in place. The packing was removed. The peritoneum was then closed with a 0-vicryl in a pursestring fashion. he right fallopian tube and ovary was grapsed with a Babcock clamp.  A marking pen was used to delineate 4 quadrants of vaginal mucosa to remove. Some epithelium was left on the lateral sides of the vagina. The mucosa was injected with 0.5% lidocaine  with epinephrine .  An incision was made with a 15 blade scalpel along these marked lines. After an Allis clamp  was placed on the vaginal mucosa, the underlying vaginal muscularis was dissected off the mucosa.  The vaginal mucosa was excised off each  quadrant. Excellent hemostasis was obtained with cautery.  Serial pursestring sutures were used to reduce the vaginal vault prolapse using 2-0 vicryl. Anterior and posterior plication sutures were placed to reapproximate the epithelium with 0-vicryl. The mucosa was then closed with a 0-vicryl suture in a running fashion.   The retropubic midurethral sling was performed next. Two Allis clamps were placed lateral to the location of the midurethra. 1% lidocaine  with epinephrine  was injected into the vaginal mucosa and laterally.  Using a 15 blade scapel a vertical incision was made in the vaginal mucosa. Metzenbaum scissors were used to dissect the vaginal mucosa off of the underlying muscularis and to create the periurethral tunnels. The trocar and attached sling were introduced into the right side of the vaginal incision, just inferior to the pubic symphysis on the right side. The trocar was guided through the endopelvic fascia and directly behind the pubic symphysis through the retropubic space, vertically.  The trocar was guided out through the suprapubic region, 2 fingerbreadths lateral to midline at the level of the pubic symphysis on the ipsilateral side. The trocar was similarly placed on the left side.  The Foley catheter was removed.  A 70-degree cystoscope was introduced, and 360-degree inspection revealed no trauma or trocars in the bladder, with bilateral ureteral efflux.  The bladder was drained and the cystoscope was removed.  The Foley catheter was reinserted. The sling was brought to lie beneath the mid-urethra.  An empty needle driver was placed behind the sling to ensure a tension free placement. The trocars were removed.  The plastic sheath was removed from the sling and the distal ends of the sling were trimmed just below the level of the skin incisions. The skin incisions were closed with dermabond.  Hemostasis was noted.  Tension-free positioning of the sling was confirmed.  The vaginal  incision was closed with 2-0 vicryl.  Attention was then turned to the perineum.  Two Allis clamps were placed at the level of the hymenal ring and 1% lidocaine  with epinephrine  was injected into the vaginal mucosa. A diamond shaped incision was made over the perineum with a 15 blade scalpel, and the epithelium was removed.  The underlying tissue was then dissected off of the epithelium bilaterally with Metzenbaum scissors. The levator muscles were plicated to the midline with a 0-vicryl interrupted suture.  Two 0-Vicryl sutures were used to reapproximate the perineal body. A 2-0 vicryl suture was then used to close the epithelium in a subcutaneous and running fashion.  The vagina was copiously irrigated.  Hemostasis was noted.   The patient tolerated the procedure well.  She was awakened from anesthesia and transferred to the recovery room in stable condition. All counts were correct x 2.    Arma Lamp, MD

## 2024-01-11 ENCOUNTER — Encounter (HOSPITAL_COMMUNITY): Payer: Self-pay | Admitting: Obstetrics and Gynecology

## 2024-01-11 LAB — SURGICAL PATHOLOGY

## 2024-01-11 NOTE — Telephone Encounter (Signed)
 Gursimran  underwent Hysterectomy, Vaginal, Colpocleisis, Colporrhaphy, Posterior, For Rectocele Repair, Creation, Urethral Sling, Retropubic Approach, Using Polypropylene Tape, and Cystoscopy  on 01/10/2024  with [] Dr Frutoso Jing [] Dr Aron Lard.  The patient reports that her pain is controlled.  She is taking [] No Medication [] Acetaminophen  500mg  every 6 hours [x] Ibuprofen  600mg  every 6 hours or [x]  Prescribed Narcotic.  Her pain level is 1[x] with medication [] Without medication is.   She reports vaginal bleeding.  The patient is tolerating PO fluids and solids. She has not had a bowel movement and is not taking Miralax  for a bowel regimen. She is not passing gas.  She was discharged with a catheter.   []  Discharged without a catheter, the patient does feel as if she is emptying her bladder.  [] Discharged with a catheter, the patient is not having any concerns with her catheter.  She will return for a voiding trial. [x] Verified scheduled date and time with patient.  She does not having any additional questions.  Reviewed Post operative instructions as needed to answer additional questions.   CC'd note to patient's provider.

## 2024-01-11 NOTE — Anesthesia Postprocedure Evaluation (Signed)
 Anesthesia Post Note  Patient: Amy Harrington  Procedure(s) Performed: HYSTERECTOMY, VAGINAL (Vagina ) COLPOCLEISIS (Vagina ) COLPORRHAPHY, POSTERIOR, FOR RECTOCELE REPAIR (Vagina ) CREATION, URETHRAL SLING, RETROPUBIC APPROACH, USING POLYPROPYLENE TAPE (Vagina ) CYSTOSCOPY (Bladder)     Patient location during evaluation: PACU Anesthesia Type: General Level of consciousness: awake and alert Pain management: pain level controlled Vital Signs Assessment: post-procedure vital signs reviewed and stable Respiratory status: spontaneous breathing, nonlabored ventilation, respiratory function stable and patient connected to nasal cannula oxygen Cardiovascular status: blood pressure returned to baseline and stable Postop Assessment: no apparent nausea or vomiting Anesthetic complications: no  No notable events documented.  Last Vitals:  Vitals:   01/10/24 1315 01/10/24 1615  BP: (!) 113/46 121/76  Pulse: 72 60  Resp:    Temp:  36.4 C  SpO2: 92%     Last Pain:  Vitals:   01/10/24 1615  TempSrc:   PainSc: 0-No pain   Pain Goal: Patients Stated Pain Goal: 5 (01/10/24 0609)                 Austina Constantin L Zaiden Ludlum

## 2024-01-12 ENCOUNTER — Ambulatory Visit

## 2024-01-12 NOTE — Patient Instructions (Signed)
  Please drink lots of water and try to expel as much urine as you are inputting. If you are unable to urinate, give the office a call before 2 pm.     Please keep all future appointments and if you have any questions or concerns please feel free to contact our office at [phone].

## 2024-01-12 NOTE — Progress Notes (Signed)
 Amy Harrington is a 73 y.o. female is here for a voiding trial.  14 fr cath removed with no complications.  Instilled: 250 ML Voided: 300 ml PVR: 12 ml

## 2024-02-22 ENCOUNTER — Encounter: Admitting: Obstetrics and Gynecology

## 2024-02-23 ENCOUNTER — Inpatient Hospital Stay: Payer: Medicare Other | Attending: Hematology and Oncology | Admitting: Hematology and Oncology

## 2024-02-23 VITALS — BP 138/80 | HR 73 | Temp 97.8°F | Resp 18 | Ht 63.0 in | Wt 190.0 lb

## 2024-02-23 DIAGNOSIS — Z170 Estrogen receptor positive status [ER+]: Secondary | ICD-10-CM | POA: Insufficient documentation

## 2024-02-23 DIAGNOSIS — C50211 Malignant neoplasm of upper-inner quadrant of right female breast: Secondary | ICD-10-CM | POA: Insufficient documentation

## 2024-02-23 DIAGNOSIS — Z79811 Long term (current) use of aromatase inhibitors: Secondary | ICD-10-CM | POA: Diagnosis not present

## 2024-02-23 MED ORDER — NYSTATIN 100000 UNIT/GM EX POWD: 1.0000 | Freq: Three times a day (TID) | CUTANEOUS | 0 refills | Status: AC

## 2024-02-23 NOTE — Assessment & Plan Note (Signed)
 09/07/2019:Right lumpectomy [facility]): IDC with calcifications, grade 2, 1.5cm, clear margins, 2 lymph nodes negative.  ER 100%, PR 70%, HER-2 positive, Ki-67 15% T1c N0 stage Ia   Treatment plan: 1. Adjuvant chemotherapy with Taxol  [name]  weekly x12 completed 12/20/2019 followed by [name]  maintenance for 1 year completed December 2021 2.  Adjuvant radiation completed 01/2024 3.  Follow-up adjuvant antiestrogen therapy initially treated with anastrozole  started June 2021 switched to letrozole  November 2021 switched to Tamoxifen  Feb 2022 due to severe leg pain ------------------------------------------------------------------------------------------------------------------------------  Current treatment: Letrozole  toxicities: Severe leg pains, switched to Tamoxifen  (10/02/20) Tamoxifen  Toxicities: Denies any problems further with the legs.    Breast cancer surveillance:  1. [facility] mammogram 08/2022: Benign breast density category A 2.  Breast exam 02/23/2024: Benign Bone density 02/28/2020: T score -1.3: Mild osteopenia recommended exercise and vitamin D   She stays fairly active and walks up to 10,000 steps a day.   Return to clinic in 1 year for follow-up.

## 2024-02-23 NOTE — Progress Notes (Signed)
 Patient Care Team: Faustina Hood, MD as PCP - General (Family Medicine) Bensimhon, Rheta Celestine, MD as PCP - Advanced Heart Failure (Cardiology) Alane Hsu, RN as Oncology Nurse Navigator Auther Bo, RN as Oncology Nurse Navigator Cameron Cea, MD as Consulting Physician (Hematology and Oncology) Johna Myers, MD as Consulting Physician (Radiation Oncology) Lockie Rima, MD as Consulting Physician (General Surgery) Johna Myers, MD as Consulting Physician (Radiation Oncology)  DIAGNOSIS:  Encounter Diagnosis  Name Primary?   Malignant neoplasm of 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, estrogen receptor positive (HCC) Yes    SUMMARY OF ONCOLOGIC HISTORY: Oncology History  Malignant neoplasm of 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, estrogen receptor positive (HCC)  08/17/2019 Initial Diagnosis   Routine screening mammogram detected a 0.8cm indeterminate mass in the right breast. Biopsy showed invasive ductal carcinoma, grade 2, HER-2 + by FISH, ER+ 100%, PR+ 70%, Ki67 15%.    08/22/2019 Cancer Staging   Staging form: Breast, AJCC 8th Edition - Clinical stage from 08/22/2019: Stage IA (cT1b, cN0, cM0, G2, ER+, PR+, HER2+) - Signed by Cameron Cea, MD on 08/22/2019   09/07/2019 Surgery   Right lumpectomy [facility]): IDC with calcifications, grade 2, 1.5cm, clear margins, 2 lymph nodes negative.    10/04/2019 -  Adjuvant Chemotherapy   Weekly Taxol  and [name]  x 12 completing on 12/20/2019, then maintenance [name]  through the rest of 2021.   01/17/2020 - 02/12/2020 Radiation Therapy   Adjuvant radiation therapy   03/04/2020 -  Anti-estrogen oral therapy   Anastrozole  switched to letrozole  08/20/2020 for muscle stiffness     CHIEF COMPLIANT:   HISTORY OF PRESENT ILLNESS:  History of Present Illness Amy Harrington "Amy Harrington" is a 73 year old female with breast cancer who presents for follow-up regarding her recent hysterectomy and ongoing tamoxifen  therapy.  She was referred by Dr. Frutoso Jing for follow-up after her hysterectomy.  She underwent a hysterectomy on January 10, 2024, for a prolapsed uterus and bladder, with uterine polyps. Post-operative recovery included expected fatigue and pain, now resolved. Biopsies showed benign polyps.  She is on tamoxifen  therapy for breast cancer, experiencing occasional hot flashes as facial flushing. She takes tamoxifen  daily with sufficient refills.  A rash under her breasts, attributed to moisture and irritation, causes burning and itching. She manages it by keeping the area dry. No breast pain or discomfort is reported.     ALLERGIES:  is allergic to anastrozole  and letrozole .  MEDICATIONS:  Current Outpatient Medications  Medication Sig Dispense Refill   acetaminophen  (TYLENOL ) 500 MG tablet Take 1 tablet (500 mg total) by mouth every 6 (six) hours as needed (pain). 30 tablet 0   esomeprazole (NEXIUM) 20 MG capsule Take 20 mg by mouth daily.     estradiol  (ESTRACE ) 0.1 MG/GM vaginal cream Place 0.5 g vaginally 2 (two) times a week. Place 0.5g nightly for two weeks then twice a week after 42.5 g 11   Vibegron  75 MG TABS Take 1 tablet (75 mg total) by mouth daily. 30 tablet 5   ibuprofen  (ADVIL ) 600 MG tablet Take 1 tablet (600 mg total) by mouth every 6 (six) hours as needed. (Patient not taking: Reported on 02/23/2024) 30 tablet 0   polyethylene glycol powder (GLYCOLAX /MIRALAX ) 17 GM/SCOOP powder Take 17 g by mouth daily. Drink 17g (1 scoop) dissolved in water per day. (Patient not taking: Reported on 12/29/2023) 255 g 0   tamoxifen  (NOLVADEX ) 20 MG tablet TAKE 1 TABLET EVERY DAY (Patient not taking: Reported  on 02/23/2024) 90 tablet 3   No current facility-administered medications for this visit.    PHYSICAL EXAMINATION: ECOG PERFORMANCE STATUS: 1 - Symptomatic but completely ambulatory  Vitals:   02/23/24 0950  BP: 138/80  Pulse: 73  Resp: 18  Temp: 97.8 F (36.6 C)  SpO2: 96%   Filed Weights    02/23/24 0950  Weight: 190 lb (86.2 kg)      LABORATORY DATA:  I have reviewed the data as listed    Latest Ref Rng & Units 08/07/2020    9:19 AM 06/26/2020    9:35 AM 05/15/2020    8:29 AM  CMP  Glucose 70 - 99 mg/dL 87  85  97   BUN 8 - 23 mg/dL 17  16  14    Creatinine 0.44 - 1.00 mg/dL 9.14  7.82  9.56   Sodium 135 - 145 mmol/L 140  140  141   Potassium 3.5 - 5.1 mmol/L 4.2  4.2  4.2   Chloride 98 - 111 mmol/L 108  108  108   CO2 22 - 32 mmol/L 26  27  27    Calcium 8.9 - 10.3 mg/dL 9.4  9.5  9.9   Total Protein 6.5 - 8.1 g/dL 6.7  6.4  6.4   Total Bilirubin 0.3 - 1.2 mg/dL 0.5  0.7  0.6   Alkaline Phos 38 - 126 U/L 64  62  66   AST 15 - 41 U/L 23  23  24    ALT 0 - 44 U/L 21  20  19      Lab Results  Component Value Date   WBC 6.3 01/03/2024   HGB 13.2 01/03/2024   HCT 40.7 01/03/2024   MCV 95.3 01/03/2024   PLT 190 01/03/2024   NEUTROABS 4.4 08/07/2020    ASSESSMENT & PLAN:  Malignant neoplasm of 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, estrogen receptor positive (HCC) 09/07/2019:Right lumpectomy ([name]): IDC with calcifications, grade 2, 1.5cm, clear margins, 2 lymph nodes negative.  ER 100%, PR 70%, HER-2 positive, Ki-67 15% T1c N0 stage Ia   Treatment plan: 1. Adjuvant chemotherapy with Taxol  [name]  weekly x12 completed 12/20/2019 followed by [name]  maintenance for 1 year completed December 2021 2.  Adjuvant radiation completed 01/2024 3.  Follow-up adjuvant antiestrogen therapy initially treated with anastrozole  started June 2021 switched to letrozole  November 2021 switched to Tamoxifen  Feb 2022 due to severe leg pain ------------------------------------------------------------------------------------------------------------------------------  Current treatment: Letrozole  toxicities: Severe leg pains, switched to Tamoxifen  (10/02/20) Tamoxifen  Toxicities: Denies any problems further with the legs.    Breast cancer surveillance:  1. [facility] mammogram  08/2023: Benign breast density category A   Bone density 02/28/2020: T score -1.3: Mild osteopenia recommended exercise and vitamin D   She stays fairly active and walks up to 10,000 steps a day.   April 2025: Hysterectomy: Benign Return to clinic in 1 year for follow-up. ------------------------------------- Assessment and Plan Assessment & Plan Malignant neoplasm of upper-inner quadrant of right breast On tamoxifen  for estrogen receptor-positive breast cancer with occasional hot flashes. History of radiation therapy. Consideration of breast cancer index test next year for extended therapy evaluation. - Continue tamoxifen  20 MG oral daily. - Order breast cancer index test next year to assess the need for extended tamoxifen  therapy. - Continue annual mammograms in December at [facility].  Intertrigo Rash under breast due to moisture and friction, with burning and itching. No infection. - Prescribe nystatin powder for application to affected area.  No orders of the defined types were placed in this encounter.  The patient has a good understanding of the overall plan. she agrees with it. she will call with any problems that may develop before the next visit here. Total time spent: 30 mins including face to face time and time spent for planning, charting and co-ordination of care   Viinay K Shaundrea Carrigg, MD 02/23/24

## 2024-02-27 ENCOUNTER — Encounter: Payer: Self-pay | Admitting: Obstetrics and Gynecology

## 2024-02-27 ENCOUNTER — Ambulatory Visit (INDEPENDENT_AMBULATORY_CARE_PROVIDER_SITE_OTHER): Admitting: Obstetrics and Gynecology

## 2024-02-27 ENCOUNTER — Other Ambulatory Visit (HOSPITAL_COMMUNITY)
Admission: RE | Admit: 2024-02-27 | Discharge: 2024-02-27 | Disposition: A | Discharge status: Home / Self Care | Source: Ambulatory Visit | Attending: Obstetrics and Gynecology | Admitting: Obstetrics and Gynecology

## 2024-02-27 VITALS — BP 120/74 | HR 74

## 2024-02-27 DIAGNOSIS — N898 Other specified noninflammatory disorders of vagina: Secondary | ICD-10-CM

## 2024-02-27 DIAGNOSIS — Z48816 Encounter for surgical aftercare following surgery on the genitourinary system: Secondary | ICD-10-CM

## 2024-02-27 DIAGNOSIS — Z9889 Other specified postprocedural states: Secondary | ICD-10-CM

## 2024-02-27 DIAGNOSIS — R32 Unspecified urinary incontinence: Secondary | ICD-10-CM

## 2024-02-27 NOTE — Progress Notes (Signed)
 [facility]  Date of Visit: 02/27/2024  History of Present Illness: Ms. Amy Harrington is a 73 y.o. female scheduled today for a post-operative visit.   Surgery: s/p Total vaginal hysterectomy, colpocleisis, levator plication, perineorrhaphy, midurethral sling, cystoscopy on 01/10/24  She did not pass her postoperative void trial. Returned 01/12/24 and passed voiding trial  Postoperative course has been uncomplicated.   Today she reports she has some burning and itching for the last few weeks. She is using the estrogen cream twice a week. She has a slight discharge. Had some bleeding for about 2 weeks but has since improved.   UTI in the last 6 weeks? No  Pain? No  She has returned to her normal activity (except for postop restrictions). She feels she gets tired more easily.  Vaginal bulge? No  Stress incontinence: Yes - usually when bladder is more full Urgency/frequency: No - voids every 2-3 hours, wakes up once at night.  Urge incontinence: Yes - when her bladder is full, has some leakage on the way to the bathroom.  Drinking: water, diet coke (can) occasionally Voiding dysfunction: No  Bowel issues: No - has been taking miralax   Subjective Success: Do you usually have a bulge or something falling out that you can see or feel in the vaginal area? No  Retreatment Success: Any retreatment with surgery or pessary for any compartment? No   Pathology results: UTERUS AND CERVIX, HYSTERECTOMY:  Cervix:           Unremarkable.            Negative for dysplasia or malignancy.        Endocervix:            Nabothian cysts.            Negative for hyperplasia, atypia or malignancy.        Endometrium:            Benign endometrial polyp.  Benign inactive endometrium.            Negative for hyperplasia, atypia or malignancy.        Myometrium:            Unremarkable.            Negative for malignancy.        Serosa:            Focal endosalpingiosis.            Negative for  malignancy.   Medications: She has a current medication list which includes the following prescription(s): esomeprazole, estradiol , nystatin , and tamoxifen .   Allergies: Patient is allergic to anastrozole  and letrozole .   Physical Exam: BP 120/74   Pulse 74    Suprapubic Incisions: healing well.  Pelvic Examination: Perineal incision healing- some sutures remaining. Vagina: Incisions healing well. Sutures are present at incision line and there is not granulation tissue.  No visible or palpable mesh. Aptima swab obtained- small amount of white discharge present.   POP-Q: POP-Q  -2                                            Aa   -2  Ba  -2                                              C   3                                            Gh  5.5                                            Pb  2                                            tvl   -2                                            Ap  -2                                            Bp                                                 D    ---------------------------------------------------------  Assessment and Plan:  1. Post-operative state   2. Vaginal itching     - Continue vaginal estrogen twice a week.  - Will reexamine in one month to ensure healing after sutures dissolve. Will also follow up on incontinence- she does not feel it is bothersome enough to restart medication at this time.  - Can resume regular activity including exercise. Discussed avoidance of heavy lifting and straining long term to reduce the risk of recurrence.    Return in about 1 month (around 03/28/2024) for post op.  Arma Lamp, MD

## 2024-02-28 ENCOUNTER — Ambulatory Visit: Attending: Obstetrics and Gynecology | Admitting: Physical Therapy

## 2024-02-28 ENCOUNTER — Other Ambulatory Visit: Payer: Self-pay

## 2024-02-28 ENCOUNTER — Encounter: Payer: Self-pay | Admitting: Physical Therapy

## 2024-02-28 DIAGNOSIS — R293 Abnormal posture: Secondary | ICD-10-CM | POA: Insufficient documentation

## 2024-02-28 DIAGNOSIS — R279 Unspecified lack of coordination: Secondary | ICD-10-CM | POA: Diagnosis not present

## 2024-02-28 DIAGNOSIS — M6281 Muscle weakness (generalized): Secondary | ICD-10-CM | POA: Insufficient documentation

## 2024-02-28 LAB — CERVICOVAGINAL ANCILLARY ONLY
Bacterial Vaginitis (gardnerella): NEGATIVE
Candida Glabrata: NEGATIVE
Candida Vaginitis: NEGATIVE
Comment: NEGATIVE
Comment: NEGATIVE
Comment: NEGATIVE

## 2024-02-28 NOTE — Therapy (Signed)
 OUTPATIENT PHYSICAL THERAPY FEMALE PELVIC EVALUATION   Patient Name: Amy Harrington MRN: [id number] DOB:06-04-1951, 73 y.o., female Today's Date: 02/28/2024  END OF SESSION:  PT End of Session - 02/28/24 1013     Visit Number 1    Number of Visits 10    Date for PT Re-Evaluation 03/27/24    Authorization Type Medicare    PT Start Time 0930    PT Stop Time 1015    PT Time Calculation (min) 45 min    Activity Tolerance Patient tolerated treatment well    Behavior During Therapy [facility] for tasks assessed/performed             Past Medical History:  Diagnosis Date   GERD (gastroesophageal reflux disease)    History of cancer chemotherapy    right breast cancer;   10-04-2019  to  12-20-2019   History of external beam radiation therapy    right breast cancer;   01-17-2020  to  02-12-2020   Malignant neoplasm of 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, estrogen receptor positive (HCC) 07/2019   onologist---  dr Lee Public  surgeon---  dr Cherlynn Cornfield;  dx 11/ 2020;   09-07-2019 s/p right lumpectomy w/ node dissection;  Stage 1a,G2, IDC ;   completed adjuvant chemo 12-20-2019;  completed IMRT 02-12-2020   OAB (overactive bladder)    Plantar fasciitis    intermittant   PONV (postoperative nausea and vomiting)    Presence of pessary    Prolapse of female pelvic organs    SUI (stress urinary incontinence, female)    Past Surgical History:  Procedure Laterality Date   BLADDER SUSPENSION N/A 01/10/2024   Procedure: CREATION, URETHRAL SLING, RETROPUBIC APPROACH, USING POLYPROPYLENE TAPE;  Surgeon: Arma Lamp, MD;  Location: [facility] OR;  Service: Gynecology;  Laterality: N/A;  midrurethral sling   BREAST LUMPECTOMY WITH RADIOACTIVE SEED AND SENTINEL LYMPH NODE BIOPSY Right 09/07/2019   Procedure: RIGHT BREAST LUMPECTOMY WITH RADIOACTIVE SEED AND SENTINEL LYMPH NODE BIOPSY;  Surgeon: Lockie Rima, MD;  Location: [facility];  Service: General;  Laterality: Right;    CATARACT EXTRACTION W/ INTRAOCULAR LENS IMPLANT Bilateral 2021   COLONOSCOPY WITH PROPOFOL   2023   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   COLPOCLEISIS N/A 01/10/2024   Procedure: COLPOCLEISIS;  Surgeon: Arma Lamp, MD;  Location: [facility] OR;  Service: Gynecology;  Laterality: N/A;   CYSTOSCOPY N/A 01/10/2024   Procedure: CYSTOSCOPY;  Surgeon: Arma Lamp, MD;  Location: [facility] OR;  Service: Gynecology;  Laterality: N/A;   INGUINAL HERNIA REPAIR Left 1973   PORT-A-CATH REMOVAL N/A 09/16/2020   Procedure: REMOVAL PORT-A-CATH;  Surgeon: Lockie Rima, MD;  Location: [facility];  Service: General;  Laterality: N/A;   PORTACATH PLACEMENT Left 09/07/2019   Procedure: INSERTION PORT-A-CATH WITH ULTRASOUND;  Surgeon: Lockie Rima, MD;  Location: [facility];  Service: General;  Laterality: Left;   RECTOCELE REPAIR N/A 01/10/2024   Procedure: COLPORRHAPHY, POSTERIOR, FOR RECTOCELE REPAIR;  Surgeon: Arma Lamp, MD;  Location: [facility] OR;  Service: Gynecology;  Laterality: N/A;  -levator plication with perineorrhaphy   VAGINAL HYSTERECTOMY N/A 01/10/2024   Procedure: HYSTERECTOMY, VAGINAL;  Surgeon: Arma Lamp, MD;  Location: [facility] OR;  Service: Gynecology;  Laterality: N/A;   Patient Active Problem List   Diagnosis Date Noted   Tear of gluteus medius tendon 12/09/2022   Malignant neoplasm of 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, estrogen receptor positive (HCC) 08/17/2019   Gastroesophageal reflux disease 10/10/2017  PCP: Faustina Hood, MD  REFERRING PROVIDER: Arma Lamp, MD   REFERRING DIAG: [phone] (ICD-10-CM) - Uterovaginal prolapse, incomplete N39.3 (ICD-10-CM) - SUI (stress urinary incontinence, female)  THERAPY DIAG:  Muscle weakness (generalized)  Unspecified lack of coordination  Abnormal posture  Rationale for Evaluation and Treatment: Rehabilitation  ONSET DATE: 01/10/24  SUBJECTIVE:                                                                                                                                                                                            SUBJECTIVE STATEMENT: "Amy Harrington"  Patient reports to pelvic PT after partial hysterectomy 01/10/24. She has been feeling tired since the surgery. 0/10 pain to report. Some urinary concerns since surgery, including urgency that almost leads to incontinence if she is not able to make it to the bathroom on time. Fluid intake: 3-4 8oz water bottles per day; drinks some diet coke (1-2/day), occasionally tea or lemonade   PAIN:  Are you having pain? No NPRS scale: 0/10  PRECAUTIONS: None  RED FLAGS: None   WEIGHT BEARING RESTRICTIONS: No  FALLS:  Has patient fallen in last 6 months? 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, sitting majority of day   ACTIVITY LEVEL : walks 10k steps per day, housework and yardwork   PLOF: Independent with basic ADLs  PATIENT GOALS: to get stronger overall with functional tasks after hysterectomy   PERTINENT HISTORY:  Partial hysterectomy 01/10/24, hernia repair 20+ years ago  Sexual abuse: No  BOWEL MOVEMENT: Pain with bowel movement: No Type of bowel movement:Type (Bristol Stool Scale) 4, Frequency 1-2x/day, Strain no, and Splinting no Fully empty rectum: Yes:   Leakage: No Pads: No Fiber supplement/laxative sometimes if needed - miralax    URINATION: Pain with urination: No Fully empty bladder: Yes:   Stream: Strong Urgency: Yes  Frequency: within normal limits  Leakage: Urge to void Pads: Yes: in the last month due to leakage   INTERCOURSE:  Ability to have vaginal penetration No  Pain with intercourse: none DrynessNo  PREGNANCY: Vaginal deliveries 2 Tearing Yes: tearing with first   PROLAPSE: None  OBJECTIVE:  Note: Objective measures were completed at Evaluation unless otherwise noted.  PATIENT SURVEYS:  PFIQ-7: 16  COGNITION: Overall cognitive status:  Within functional limits for tasks assessed     SENSATION: Light touch: Appears intact  LUMBAR SPECIAL TESTS:  Prone instability test: Positive  FUNCTIONAL TESTS:  Squat: lateral weight shift to the right, right LE dynamic knee valgus with loading   GAIT: Assistive device utilized: None Comments: moderate trendelenburg gait pattern with ambulation  POSTURE: rounded shoulders, forward head, and flexed trunk   LUMBARAROM/PROM:  A/PROM A/PROM  eval  Flexion 25% limited  Extension 50% limited  Right lateral flexion 25% limited  Left lateral flexion 25% limited  Right rotation 25% limited  Left rotation 25% limited   (Blank rows = not tested)  LOWER EXTREMITY ROM: within functional limits   Active ROM Right eval Left eval  Hip flexion    Hip extension    Hip abduction    Hip adduction    Hip internal rotation    Hip external rotation    Knee flexion    Knee extension    Ankle dorsiflexion    Ankle plantarflexion    Ankle inversion    Ankle eversion     (Blank rows = not tested)  LOWER EXTREMITY MMT:  MMT Right eval Left eval  Hip flexion 4 4  Hip extension 4 4  Hip abduction 3+ 3+  Hip adduction 3+ 3+  Hip internal rotation    Hip external rotation    Knee flexion 4 4  Knee extension 4 4  Ankle dorsiflexion    Ankle plantarflexion    Ankle inversion    Ankle eversion     (Blank rows = not tested) PALPATION:   General: no significant tenderness to palpation of bilateral adductors or hip flexors   Pelvic Alignment: within normal limits   Abdominal: upper chest breathing and abdominal bracing present at rest with decreased lower rib excursion with inhalation                 External Perineal Exam: dryness present with sufficient clitoral hood mobility                              Internal Pelvic Floor: Patient was unable to perform a pelvic floor contraction independently in the hooklying position today. With cueing to engage her pelvic floor, she  tends to push down and out rather than drawing the musculature up and in, demonstrating a lack of coordination. With cueing to engage her transverse abdominis, patient was able to slightly lift the pelvic floor with exhalation. She is able to actively lengthen her pelvic floor very well. She plans to work on this coordination with this exercise in the hooklying position. Patient demonstrates normal tone throughout superficial and deep pelvic floor musculature bilaterally, and had no pain with today's examination.   Patient confirms identification and approves PT to assess internal pelvic floor and treatment Yes No emotional/communication barriers or cognitive limitation. Patient is motivated to learn. Patient understands and agrees with treatment goals and plan. PT explains patient will be examined in standing, sitting, and lying down to see how their muscles and joints work. When they are ready, they will be asked to remove their underwear so PT can examine their perineum. The patient is also given the option of providing their own chaperone as one is not provided in our facility. The patient also has the right and is explained the right to defer or refuse any part of the evaluation or treatment including the internal exam. With the patient's consent, PT will use one gloved finger to gently assess the muscles of the pelvic floor, seeing how well it contracts and relaxes and if there is muscle symmetry. After, the patient will get dressed and PT and patient will discuss exam findings and plan of care. PT and patient discuss plan of care, schedule, attendance policy  and HEP activities.  PELVIC MMT:   MMT eval  Vaginal Unable to perform pelvic floor contraction independently.  Internal Anal Sphincter   External Anal Sphincter   Puborectalis   Diastasis Recti   (Blank rows = not tested)       TONE: Within normal limits   PROLAPSE: N/A in hooklying   TODAY'S TREATMENT:                                                                                                                               DATE:   EVAL 02/28/24: Examination completed, findings reviewed, pt educated on POC, HEP, and self care. Pt motivated to participate in PT and agreeable to attempt recommendations.   Neuro re-ed: Hooklying diaphragmatic breathing + pelvic floor lengthening with inhalation and shortening with exhalation (cueing to draw bellybutton in as she blows out) 3x10  Self care: relative anatomy and the connection between the diaphragm and pelvic floor; how to correctly perform a pelvic floor contraction vs pushing down through the pelvic floor   PATIENT EDUCATION:  Education details: relative anatomy and the connection between the diaphragm and pelvic floor; how to correctly perform a pelvic floor contraction vs pushing down through the pelvic floor  Person educated: Patient Education method: Explanation, Demonstration, Tactile cues, Verbal cues, and Handouts Education comprehension: verbalized understanding, returned demonstration, verbal cues required, tactile cues required, and needs further education  HOME EXERCISE PROGRAM: Access Code: [email or web address] URL: [email or web address] Date: 02/28/2024 Prepared by: Robbin Chill  Exercises - Supine Pelvic Floor Contraction  - 1 x daily - 7 x weekly - 3 sets - 10 reps - Quick Flick Pelvic Floor Contractions in Hooklying  - 1 x daily - 7 x weekly - 3 sets - 10 reps  ASSESSMENT:  CLINICAL IMPRESSION: Patient is a 73 y.o. female  who was seen today for physical therapy evaluation and treatment for post-hysterectomy treatment for urinary urgency and strengthening of the musculature s/p partial hysterectomy 01/10/24. Since her surgery, patient has had no pain, however, she is started to notice that her urinary urgency is almost causing leakage if she waits too long to go to the bathroom to void. No pelvic pain at rest, no constipation issues to report, and patient  is not sexually active. She fully consented to today's internal vaginal examination. Patient was unable to perform a pelvic floor contraction independently in the hooklying position today. With cueing to engage her pelvic floor, she tends to push down and out rather than drawing the musculature up and in, demonstrating a lack of coordination. With cueing to engage her transverse abdominis, patient was able to slightly lift the pelvic floor with exhalation. She is able to actively lengthen her pelvic floor very well with inhalation. She plans to work on this coordination with this exercise in the hooklying position. Patient demonstrates normal tone throughout superficial and deep pelvic floor musculature bilaterally, and had no pain with today's examination. Overall, patient tolerated  session very well and Pt would benefit from additional PT to further address deficits.    OBJECTIVE IMPAIRMENTS: decreased coordination, decreased endurance, decreased mobility, decreased ROM, and decreased strength.   ACTIVITY LIMITATIONS: continence  PARTICIPATION LIMITATIONS: cleaning, laundry, driving, community activity, occupation, and yard work  PERSONAL FACTORS: Age, Past/current experiences, and Time since onset of injury/illness/exacerbation are also affecting patient's functional outcome.   REHAB POTENTIAL: Excellent  CLINICAL DECISION MAKING: Stable/uncomplicated  EVALUATION COMPLEXITY: Low   GOALS: Goals reviewed with patient? Yes  SHORT TERM GOALS: Target date: 03/27/2024  Pt will be independent with HEP.  Baseline: Goal status: INITIAL  2.  Pt will be independent with diaphragmatic breathing and down training activities in order to improve pelvic floor relaxation.  Baseline:  Goal status: INITIAL  3.  Pt will be independent with the knack, urge suppression technique, and double voiding in order to improve bladder habits and decrease urinary incontinence.   Baseline:  Goal status:  INITIAL  LONG TERM GOALS: Target date: 08/29/2024  Pt will be independent with advanced HEP.  Baseline:  Goal status: INITIAL  2.  Pt will be able to correctly perform diaphragmatic breathing and appropriate pressure management in order to prevent worsening vaginal wall laxity and improve pelvic floor A/ROM.  Baseline:  Goal status: INITIAL  3.  Pt to demonstrate improved coordination of pelvic floor and breathing mechanics with 10# squat with appropriate synergistic patterns to decrease pain and leakage at least 75% of the time for improved ability to complete a 30 minute workout with strain at pelvic floor and symptoms.   Baseline:  Goal status: INITIAL  4.  Pt will demonstrate normal pelvic floor muscle tone and A/ROM, able to achieve 4/5 strength with contractions and 10 sec endurance, in order to provide appropriate lumbopelvic support in functional activities.   Baseline:  Goal status: INITIAL  PLAN:  PT FREQUENCY: 1-2x/week  PT DURATION: 12 weeks  PLANNED INTERVENTIONS: 97110-Therapeutic exercises, 97530- Therapeutic activity, 97112- Neuromuscular re-education, 97535- Self Care, [id number]- Manual therapy, [phone]- Aquatic Therapy, Patient/Family education, Taping, Joint mobilization, Spinal mobilization, Scar mobilization, Cryotherapy, and Moist heat  PLAN FOR NEXT SESSION: continued internal vaginal treatment to assess hooklying pelvic floor AROM, introduce hip and core strengthening  Marni Sins, PT 02/28/2024, 11:32 AM

## 2024-03-06 ENCOUNTER — Ambulatory Visit: Admitting: Physical Therapy

## 2024-03-06 DIAGNOSIS — M6281 Muscle weakness (generalized): Secondary | ICD-10-CM

## 2024-03-06 NOTE — Patient Instructions (Signed)
 Blow as you go technique:  When you would normally want to brace, breath hold, or strain, make sure you BLOW OUT instead to decrease any pressure down through your pelvic floor  For example: When picking up something from the floor, exhale as you stand up  When pushing something away from you, exhale as you push  When pulling something toward you, exhale as you pull to your direction   Toileting techniques: Make sure you sit all the way down, feet flat on the floor When voiding, do not push the urine out. Rather, let it flow naturally  When you think you are done peeing, try taking 2 deep belly breaths to see if you can pee any more  When pooping: Avoid excessive straining if you can  If you do need to strain, practice it in the following way: Imagine fogging up a mirror with your breath as you blow out while you gently push down to evacuate stool

## 2024-03-06 NOTE — Therapy (Signed)
 OUTPATIENT PHYSICAL THERAPY FEMALE PELVIC TREATMENT   Patient Name: Amy Harrington MRN: [id number] DOB:[birth year], 73 y.o., female Today's Date: 03/06/2024  END OF SESSION:  PT End of Session - 03/06/24 1055     Visit Number 2    Number of Visits 10    Date for PT Re-Evaluation 03/27/24    Authorization Type Medicare    PT Start Time 1015    PT Stop Time 1055    PT Time Calculation (min) 40 min    Activity Tolerance Patient tolerated treatment well    Behavior During Therapy [facility] for tasks assessed/performed           Past Medical History:  Diagnosis Date   GERD (gastroesophageal reflux disease)    History of cancer chemotherapy    right breast cancer;   10-04-2019  to  12-20-2019   History of external beam radiation therapy    right breast cancer;   01-17-2020  to  02-12-2020   Malignant neoplasm of 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, estrogen receptor positive (HCC) 07/2019   onologist---  dr Lee Public  surgeon---  dr Cherlynn Cornfield;  dx 11/ 2020;   09-07-2019 s/p right lumpectomy w/ node dissection;  Stage 1a,G2, IDC ;   completed adjuvant chemo 12-20-2019;  completed IMRT 02-12-2020   OAB (overactive bladder)    Plantar fasciitis    intermittant   PONV (postoperative nausea and vomiting)    Presence of pessary    Prolapse of female pelvic organs    SUI (stress urinary incontinence, female)    Past Surgical History:  Procedure Laterality Date   BLADDER SUSPENSION N/A 01/10/2024   Procedure: CREATION, URETHRAL SLING, RETROPUBIC APPROACH, USING POLYPROPYLENE TAPE;  Surgeon: Arma Lamp, MD;  Location: [facility] OR;  Service: Gynecology;  Laterality: N/A;  midrurethral sling   BREAST LUMPECTOMY WITH RADIOACTIVE SEED AND SENTINEL LYMPH NODE BIOPSY Right 09/07/2019   Procedure: RIGHT BREAST LUMPECTOMY WITH RADIOACTIVE SEED AND SENTINEL LYMPH NODE BIOPSY;  Surgeon: Lockie Rima, MD;  Location: [facility];  Service: General;  Laterality: Right;   CATARACT  EXTRACTION W/ INTRAOCULAR LENS IMPLANT Bilateral 2021   COLONOSCOPY WITH PROPOFOL   2023   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   COLPOCLEISIS N/A 01/10/2024   Procedure: COLPOCLEISIS;  Surgeon: Arma Lamp, MD;  Location: [facility] OR;  Service: Gynecology;  Laterality: N/A;   CYSTOSCOPY N/A 01/10/2024   Procedure: CYSTOSCOPY;  Surgeon: Arma Lamp, MD;  Location: [facility] OR;  Service: Gynecology;  Laterality: N/A;   INGUINAL HERNIA REPAIR Left 1973   PORT-A-CATH REMOVAL N/A 09/16/2020   Procedure: REMOVAL PORT-A-CATH;  Surgeon: Lockie Rima, MD;  Location: [facility];  Service: General;  Laterality: N/A;   PORTACATH PLACEMENT Left 09/07/2019   Procedure: INSERTION PORT-A-CATH WITH ULTRASOUND;  Surgeon: Lockie Rima, MD;  Location: [facility];  Service: General;  Laterality: Left;   RECTOCELE REPAIR N/A 01/10/2024   Procedure: COLPORRHAPHY, POSTERIOR, FOR RECTOCELE REPAIR;  Surgeon: Arma Lamp, MD;  Location: [facility] OR;  Service: Gynecology;  Laterality: N/A;  -levator plication with perineorrhaphy   VAGINAL HYSTERECTOMY N/A 01/10/2024   Procedure: HYSTERECTOMY, VAGINAL;  Surgeon: Arma Lamp, MD;  Location: [facility] OR;  Service: Gynecology;  Laterality: N/A;   Patient Active Problem List   Diagnosis Date Noted   Tear of gluteus medius tendon 12/09/2022   Malignant neoplasm of 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, estrogen receptor positive (HCC) 08/17/2019   Gastroesophageal reflux disease 10/10/2017  PCP: Faustina Hood, MD  REFERRING PROVIDER: Arma Lamp, MD   REFERRING DIAG: [phone] (ICD-10-CM) - Uterovaginal prolapse, incomplete N39.3 (ICD-10-CM) - SUI (stress urinary incontinence, female)  THERAPY DIAG:  Muscle weakness (generalized)  Rationale for Evaluation and Treatment: Rehabilitation  ONSET DATE: 01/10/24  SUBJECTIVE:                                                                                                                                                                                            SUBJECTIVE STATEMENT: Amy Harrington  Patient reports she feeling good today. 0/10 pain today. No leakage to report since last visit. She was compliant with HEP, had some lower abdominal soreness from this.  From eval: Patient reports to pelvic PT after partial hysterectomy 01/10/24. She has been feeling tired since the surgery. 0/10 pain to report. Some urinary concerns since surgery, including urgency that almost leads to incontinence if she is not able to make it to the bathroom on time. Fluid intake: 3-4 8oz water bottles per day; drinks some diet coke (1-2/day), occasionally tea or lemonade   PAIN:  Are you having pain? No NPRS scale: 0/10  PRECAUTIONS: None  RED FLAGS: None   WEIGHT BEARING RESTRICTIONS: No  FALLS:  Has patient fallen in last 6 months? 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, sitting majority of day   ACTIVITY LEVEL : walks 10k steps per day, housework and yardwork   PLOF: Independent with basic ADLs  PATIENT GOALS: to get stronger overall with functional tasks after hysterectomy   PERTINENT HISTORY:  Partial hysterectomy 01/10/24, hernia repair 20+ years ago  Sexual abuse: No  BOWEL MOVEMENT: Pain with bowel movement: No Type of bowel movement:Type (Bristol Stool Scale) 4, Frequency 1-2x/day, Strain no, and Splinting no Fully empty rectum: Yes:   Leakage: No Pads: No Fiber supplement/laxative sometimes if needed - miralax    URINATION: Pain with urination: No Fully empty bladder: Yes:   Stream: Strong Urgency: Yes  Frequency: within normal limits  Leakage: Urge to void Pads: Yes: in the last month due to leakage   INTERCOURSE:  Ability to have vaginal penetration No  Pain with intercourse: none DrynessNo  PREGNANCY: Vaginal deliveries 2 Tearing Yes: tearing with first   PROLAPSE: None  OBJECTIVE:  Note: Objective measures were  completed at Evaluation unless otherwise noted.  PATIENT SURVEYS:  PFIQ-7: 16  COGNITION: Overall cognitive status: Within functional limits for tasks assessed     SENSATION: Light touch: Appears intact  LUMBAR SPECIAL TESTS:  Prone instability test: Positive  FUNCTIONAL TESTS:  Squat: lateral weight shift to the  right, right LE dynamic knee valgus with loading   GAIT: Assistive device utilized: None Comments: moderate trendelenburg gait pattern with ambulation   POSTURE: rounded shoulders, forward head, and flexed trunk   LUMBARAROM/PROM:  A/PROM A/PROM  eval  Flexion 25% limited  Extension 50% limited  Right lateral flexion 25% limited  Left lateral flexion 25% limited  Right rotation 25% limited  Left rotation 25% limited   (Blank rows = not tested)  LOWER EXTREMITY ROM: within functional limits   Active ROM Right eval Left eval  Hip flexion    Hip extension    Hip abduction    Hip adduction    Hip internal rotation    Hip external rotation    Knee flexion    Knee extension    Ankle dorsiflexion    Ankle plantarflexion    Ankle inversion    Ankle eversion     (Blank rows = not tested)  LOWER EXTREMITY MMT:  MMT Right eval Left eval  Hip flexion 4 4  Hip extension 4 4  Hip abduction 3+ 3+  Hip adduction 3+ 3+  Hip internal rotation    Hip external rotation    Knee flexion 4 4  Knee extension 4 4  Ankle dorsiflexion    Ankle plantarflexion    Ankle inversion    Ankle eversion     (Blank rows = not tested) PALPATION:   General: no significant tenderness to palpation of bilateral adductors or hip flexors   Pelvic Alignment: within normal limits   Abdominal: upper chest breathing and abdominal bracing present at rest with decreased lower rib excursion with inhalation                 External Perineal Exam: dryness present with sufficient clitoral hood mobility                              Internal Pelvic Floor: Patient was unable to  perform a pelvic floor contraction independently in the hooklying position today. With cueing to engage her pelvic floor, she tends to push down and out rather than drawing the musculature up and in, demonstrating a lack of coordination. With cueing to engage her transverse abdominis, patient was able to slightly lift the pelvic floor with exhalation. She is able to actively lengthen her pelvic floor very well. She plans to work on this coordination with this exercise in the hooklying position. Patient demonstrates normal tone throughout superficial and deep pelvic floor musculature bilaterally, and had no pain with today's examination.   Patient confirms identification and approves PT to assess internal pelvic floor and treatment Yes No emotional/communication barriers or cognitive limitation. Patient is motivated to learn. Patient understands and agrees with treatment goals and plan. PT explains patient will be examined in standing, sitting, and lying down to see how their muscles and joints work. When they are ready, they will be asked to remove their underwear so PT can examine their perineum. The patient is also given the option of providing their own chaperone as one is not provided in our facility. The patient also has the right and is explained the right to defer or refuse any part of the evaluation or treatment including the internal exam. With the patient's consent, PT will use one gloved finger to gently assess the muscles of the pelvic floor, seeing how well it contracts and relaxes and if there is muscle symmetry. After, the patient will  get dressed and PT and patient will discuss exam findings and plan of care. PT and patient discuss plan of care, schedule, attendance policy and HEP activities.  PELVIC MMT:   MMT eval  Vaginal Unable to perform pelvic floor contraction independently.  Internal Anal Sphincter   External Anal Sphincter   Puborectalis   Diastasis Recti   (Blank rows = not  tested)       TONE: Within normal limits   PROLAPSE: N/A in hooklying   TODAY'S TREATMENT:                                                                                                                              DATE:   EVAL 02/28/24: Examination completed, findings reviewed, pt educated on POC, HEP, and self care. Pt motivated to participate in PT and agreeable to attempt recommendations.   Neuro re-ed: Hooklying diaphragmatic breathing + pelvic floor lengthening with inhalation and shortening with exhalation (cueing to draw bellybutton in as she blows out) 3x10  Self care: relative anatomy and the connection between the diaphragm and pelvic floor; how to correctly perform a pelvic floor contraction vs pushing down through the pelvic floor   03/06/24: Neuro re-ed: Hooklying diaphragmatic breathing + pelvic floor lengthening with inhalation and shortening with exhalation (cueing to draw bellybutton in as she blows out) 3x10  Therapeutic exercise: Sit to stand + diaphragmatic breathing 2x10  Bridge + diaphragmatic breathing 2x10  Sidelying clamshell + reverse clamshell + diaphragmatic breathing 2x10  Lower trunk rotations + diaphragmatic breathing 2x10  Self care: relative anatomy and the connection between the diaphragm and pelvic floor; how to correctly perform a pelvic floor contraction vs pushing down through the pelvic floor  Blow as you go technique for managing intraabdominal/pelvic floor pressure  Toileting mechanics for complete emptying of stool/urine   PATIENT EDUCATION:  Education details: relative anatomy and the connection between the diaphragm and pelvic floor; how to correctly perform a pelvic floor contraction vs pushing down through the pelvic floor  Person educated: Patient Education method: Explanation, Demonstration, Tactile cues, Verbal cues, and Handouts Education comprehension: verbalized understanding, returned demonstration, verbal cues required, tactile  cues required, and needs further education  HOME EXERCISE PROGRAM: Access Code: [email or web address] URL: [email or web address] Date: 03/06/2024 Prepared by: Robbin Chill  Exercises - Seated Pelvic Floor Contraction  - 1 x daily - 7 x weekly - 2 sets - 10 reps - Sit to Stand Without Arm Support  - 1 x daily - 7 x weekly - 2 sets - 10 reps - Supine Bridge  - 1 x daily - 7 x weekly - 2 sets - 10 reps - Clamshell  - 1 x daily - 7 x weekly - 2 sets - 10 reps - Sidelying Reverse Clamshell  - 1 x daily - 7 x weekly - 2 sets - 10 reps - Supine Lower Trunk Rotation  - 1 x daily - 7 x weekly - 2 sets -  10 reps  ASSESSMENT:  CLINICAL IMPRESSION: Patient is a 73 y.o. female  who was seen today for physical therapy treatment for post-hysterectomy care, urinary urgency, and strengthening of the musculature s/p partial hysterectomy 01/10/24. 0/10 pain upon arrival with no urinary or bowel concerns to report. Patient had some lower abdominal soreness with her first round of home exercises, but this was tolerable. Glute and hip strengthening introduced today and education provided on toileting techniques and blow as you go technique to manage intraabdominal pressure. Pelvic floor training progressed to seated position today to challenge the musculature further. Overall, patient tolerated session very well and Pt would benefit from additional PT to further address deficits.    OBJECTIVE IMPAIRMENTS: decreased coordination, decreased endurance, decreased mobility, decreased ROM, and decreased strength.   ACTIVITY LIMITATIONS: continence  PARTICIPATION LIMITATIONS: cleaning, laundry, driving, community activity, occupation, and yard work  PERSONAL FACTORS: Age, Past/current experiences, and Time since onset of injury/illness/exacerbation are also affecting patient's functional outcome.   REHAB POTENTIAL: Excellent  CLINICAL DECISION MAKING: Stable/uncomplicated  EVALUATION COMPLEXITY:  Low   GOALS: Goals reviewed with patient? Yes  SHORT TERM GOALS: Target date: 03/27/2024  Pt will be independent with HEP.  Baseline: Goal status: INITIAL  2.  Pt will be independent with diaphragmatic breathing and down training activities in order to improve pelvic floor relaxation.  Baseline:  Goal status: INITIAL  3.  Pt will be independent with the knack, urge suppression technique, and double voiding in order to improve bladder habits and decrease urinary incontinence.   Baseline:  Goal status: INITIAL  LONG TERM GOALS: Target date: 08/29/2024  Pt will be independent with advanced HEP.  Baseline:  Goal status: INITIAL  2.  Pt will be able to correctly perform diaphragmatic breathing and appropriate pressure management in order to prevent worsening vaginal wall laxity and improve pelvic floor A/ROM.  Baseline:  Goal status: INITIAL  3.  Pt to demonstrate improved coordination of pelvic floor and breathing mechanics with 10# squat with appropriate synergistic patterns to decrease pain and leakage at least 75% of the time for improved ability to complete a 30 minute workout with strain at pelvic floor and symptoms.   Baseline:  Goal status: INITIAL  4.  Pt will demonstrate normal pelvic floor muscle tone and A/ROM, able to achieve 4/5 strength with contractions and 10 sec endurance, in order to provide appropriate lumbopelvic support in functional activities.   Baseline:  Goal status: INITIAL  PLAN:  PT FREQUENCY: 1-2x/week  PT DURATION: 12 weeks  PLANNED INTERVENTIONS: 97110-Therapeutic exercises, 97530- Therapeutic activity, 97112- Neuromuscular re-education, 97535- Self Care, 82956- Manual therapy, [phone]- Aquatic Therapy, Patient/Family education, Taping, Joint mobilization, Spinal mobilization, Scar mobilization, Cryotherapy, and Moist heat  PLAN FOR NEXT SESSION: continued internal vaginal treatment to assess hooklying pelvic floor AROM, introduce hip and core  strengthening  Marni Sins, PT 03/06/2024, 10:55 AM

## 2024-03-13 ENCOUNTER — Encounter: Admitting: Physical Therapy

## 2024-03-13 ENCOUNTER — Ambulatory Visit: Payer: Self-pay | Admitting: Physical Therapy

## 2024-03-13 DIAGNOSIS — M6281 Muscle weakness (generalized): Secondary | ICD-10-CM | POA: Diagnosis not present

## 2024-03-13 NOTE — Therapy (Signed)
 OUTPATIENT PHYSICAL THERAPY FEMALE PELVIC TREATMENT   Patient Name: Amy Harrington MRN: [id number] DOB:1950/10/31, 73 y.o., female Today's Date: 03/13/2024  END OF SESSION:  PT End of Session - 03/13/24 1307     Visit Number 3    Number of Visits 10    Date for PT Re-Evaluation 03/27/24    Authorization Type Medicare    PT Start Time 1230    PT Stop Time 1310    PT Time Calculation (min) 40 min    Activity Tolerance Patient tolerated treatment well    Behavior During Therapy [facility] for tasks assessed/performed            Past Medical History:  Diagnosis Date   GERD (gastroesophageal reflux disease)    History of cancer chemotherapy    right breast cancer;   10-04-2019  to  12-20-2019   History of external beam radiation therapy    right breast cancer;   01-17-2020  to  02-12-2020   Malignant neoplasm of 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, estrogen receptor positive (HCC) 07/2019   onologist---  dr catrina  surgeon---  dr aron;  dx 11/ 2020;   09-07-2019 s/p right lumpectomy w/ node dissection;  Stage 1a,G2, IDC ;   completed adjuvant chemo 12-20-2019;  completed IMRT 02-12-2020   OAB (overactive bladder)    Plantar fasciitis    intermittant   PONV (postoperative nausea and vomiting)    Presence of pessary    Prolapse of female pelvic organs    SUI (stress urinary incontinence, female)    Past Surgical History:  Procedure Laterality Date   BLADDER SUSPENSION N/A 01/10/2024   Procedure: CREATION, URETHRAL SLING, RETROPUBIC APPROACH, USING POLYPROPYLENE TAPE;  Surgeon: Marilynne Rosaline SAILOR, MD;  Location: [facility] OR;  Service: Gynecology;  Laterality: N/A;  midrurethral sling   BREAST LUMPECTOMY WITH RADIOACTIVE SEED AND SENTINEL LYMPH NODE BIOPSY Right 09/07/2019   Procedure: RIGHT BREAST LUMPECTOMY WITH RADIOACTIVE SEED AND SENTINEL LYMPH NODE BIOPSY;  Surgeon: aron Shoulders, MD;  Location: [facility];  Service: General;  Laterality: Right;    CATARACT EXTRACTION W/ INTRAOCULAR LENS IMPLANT Bilateral 2021   COLONOSCOPY WITH PROPOFOL   2023   dr schooler   COLPOCLEISIS N/A 01/10/2024   Procedure: COLPOCLEISIS;  Surgeon: Marilynne Rosaline SAILOR, MD;  Location: [facility];  Service: Gynecology;  Laterality: N/A;   CYSTOSCOPY N/A 01/10/2024   Procedure: CYSTOSCOPY;  Surgeon: Marilynne Rosaline SAILOR, MD;  Location: [facility] OR;  Service: Gynecology;  Laterality: N/A;   INGUINAL HERNIA REPAIR Left 1973   PORT-A-CATH REMOVAL N/A 09/16/2020   Procedure: REMOVAL PORT-A-CATH;  Surgeon: aron Shoulders, MD;  Location: [facility];  Service: General;  Laterality: N/A;   PORTACATH PLACEMENT Left 09/07/2019   Procedure: INSERTION PORT-A-CATH WITH ULTRASOUND;  Surgeon: aron Shoulders, MD;  Location: [facility];  Service: General;  Laterality: Left;   RECTOCELE REPAIR N/A 01/10/2024   Procedure: COLPORRHAPHY, POSTERIOR, FOR RECTOCELE REPAIR;  Surgeon: Marilynne Rosaline SAILOR, MD;  Location: [facility] OR;  Service: Gynecology;  Laterality: N/A;  -levator plication with perineorrhaphy   VAGINAL HYSTERECTOMY N/A 01/10/2024   Procedure: HYSTERECTOMY, VAGINAL;  Surgeon: Marilynne Rosaline SAILOR, MD;  Location: [facility] OR;  Service: Gynecology;  Laterality: N/A;   Patient Active Problem List   Diagnosis Date Noted   Tear of gluteus medius tendon 12/09/2022   Malignant neoplasm of 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, estrogen receptor positive (HCC) 08/17/2019   Gastroesophageal reflux disease 10/10/2017  PCP: Claudene Pellet, MD  REFERRING PROVIDER: Marilynne Rosaline SAILOR, MD   REFERRING DIAG: [phone] (ICD-10-CM) - Uterovaginal prolapse, incomplete N39.3 (ICD-10-CM) - SUI (stress urinary incontinence, female)  THERAPY DIAG:  Muscle weakness (generalized)  Rationale for Evaluation and Treatment: Rehabilitation  ONSET DATE: 01/10/24  SUBJECTIVE:                                                                                                                                                                                            SUBJECTIVE STATEMENT: [name]  Patient reports that she is doing well today. 0/10 pain today, some soreness after last session. No urinary related leakage or concerns to report. She had some constipation the last few days. No other bowel related concerns to report.   From eval: Patient reports to pelvic PT after partial hysterectomy 01/10/24. She has been feeling tired since the surgery. 0/10 pain to report. Some urinary concerns since surgery, including urgency that almost leads to incontinence if she is not able to make it to the bathroom on time. Fluid intake: 3-4 8oz water bottles per day; drinks some diet coke (1-2/day), occasionally tea or lemonade   PAIN:  Are you having pain? No NPRS scale: 0/10  PRECAUTIONS: None  RED FLAGS: None   WEIGHT BEARING RESTRICTIONS: No  FALLS:  Has patient fallen in last 6 months? 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, sitting majority of day   ACTIVITY LEVEL : walks 10k steps per day, housework and yardwork   PLOF: Independent with basic ADLs  PATIENT GOALS: to get stronger overall with functional tasks after hysterectomy   PERTINENT HISTORY:  Partial hysterectomy 01/10/24, hernia repair 20+ years ago  Sexual abuse: No  BOWEL MOVEMENT: Pain with bowel movement: No Type of bowel movement:Type (Bristol Stool Scale) 4, Frequency 1-2x/day, Strain no, and Splinting no Fully empty rectum: Yes:   Leakage: No Pads: No Fiber supplement/laxative sometimes if needed - miralax    URINATION: Pain with urination: No Fully empty bladder: Yes:   Stream: Strong Urgency: Yes  Frequency: within normal limits  Leakage: Urge to void Pads: Yes: in the last month due to leakage   INTERCOURSE:  Ability to have vaginal penetration No  Pain with intercourse: none DrynessNo  PREGNANCY: Vaginal deliveries 2 Tearing Yes: tearing with  first   PROLAPSE: None  OBJECTIVE:  Note: Objective measures were completed at Evaluation unless otherwise noted.  PATIENT SURVEYS:  PFIQ-7: 16  COGNITION: Overall cognitive status: Within functional limits for tasks assessed     SENSATION: Light touch: Appears intact  LUMBAR SPECIAL TESTS:  Prone instability  test: Positive  FUNCTIONAL TESTS:  Squat: lateral weight shift to the right, right LE dynamic knee valgus with loading   GAIT: Assistive device utilized: None Comments: moderate trendelenburg gait pattern with ambulation   POSTURE: rounded shoulders, forward head, and flexed trunk   LUMBARAROM/PROM:  A/PROM A/PROM  eval  Flexion 25% limited  Extension 50% limited  Right lateral flexion 25% limited  Left lateral flexion 25% limited  Right rotation 25% limited  Left rotation 25% limited   (Blank rows = not tested)  LOWER EXTREMITY ROM: within functional limits   Active ROM Right eval Left eval  Hip flexion    Hip extension    Hip abduction    Hip adduction    Hip internal rotation    Hip external rotation    Knee flexion    Knee extension    Ankle dorsiflexion    Ankle plantarflexion    Ankle inversion    Ankle eversion     (Blank rows = not tested)  LOWER EXTREMITY MMT:  MMT Right eval Left eval  Hip flexion 4 4  Hip extension 4 4  Hip abduction 3+ 3+  Hip adduction 3+ 3+  Hip internal rotation    Hip external rotation    Knee flexion 4 4  Knee extension 4 4  Ankle dorsiflexion    Ankle plantarflexion    Ankle inversion    Ankle eversion     (Blank rows = not tested) PALPATION:   General: no significant tenderness to palpation of bilateral adductors or hip flexors   Pelvic Alignment: within normal limits   Abdominal: upper chest breathing and abdominal bracing present at rest with decreased lower rib excursion with inhalation                 External Perineal Exam: dryness present with sufficient clitoral hood mobility                               Internal Pelvic Floor: Patient was unable to perform a pelvic floor contraction independently in the hooklying position today. With cueing to engage her pelvic floor, she tends to push down and out rather than drawing the musculature up and in, demonstrating a lack of coordination. With cueing to engage her transverse abdominis, patient was able to slightly lift the pelvic floor with exhalation. She is able to actively lengthen her pelvic floor very well. She plans to work on this coordination with this exercise in the hooklying position. Patient demonstrates normal tone throughout superficial and deep pelvic floor musculature bilaterally, and had no pain with today's examination.   Patient confirms identification and approves PT to assess internal pelvic floor and treatment Yes No emotional/communication barriers or cognitive limitation. Patient is motivated to learn. Patient understands and agrees with treatment goals and plan. PT explains patient will be examined in standing, sitting, and lying down to see how their muscles and joints work. When they are ready, they will be asked to remove their underwear so PT can examine their perineum. The patient is also given the option of providing their own chaperone as one is not provided in our facility. The patient also has the right and is explained the right to defer or refuse any part of the evaluation or treatment including the internal exam. With the patient's consent, PT will use one gloved finger to gently assess the muscles of the pelvic floor, seeing how well it contracts  and relaxes and if there is muscle symmetry. After, the patient will get dressed and PT and patient will discuss exam findings and plan of care. PT and patient discuss plan of care, schedule, attendance policy and HEP activities.  PELVIC MMT:   MMT eval  Vaginal Unable to perform pelvic floor contraction independently.  Internal Anal Sphincter   External Anal  Sphincter   Puborectalis   Diastasis Recti   (Blank rows = not tested)       TONE: Within normal limits   PROLAPSE: N/A in hooklying   TODAY'S TREATMENT:                                                                                                                              DATE:   EVAL 02/28/24: Examination completed, findings reviewed, pt educated on POC, HEP, and self care. Pt motivated to participate in PT and agreeable to attempt recommendations.   Neuro re-ed: Hooklying diaphragmatic breathing + pelvic floor lengthening with inhalation and shortening with exhalation (cueing to draw bellybutton in as she blows out) 3x10  Self care: relative anatomy and the connection between the diaphragm and pelvic floor; how to correctly perform a pelvic floor contraction vs pushing down through the pelvic floor   03/06/24: Neuro re-ed: Hooklying diaphragmatic breathing + pelvic floor lengthening with inhalation and shortening with exhalation (cueing to draw bellybutton in as she blows out) 3x10  Therapeutic exercise: Sit to stand + diaphragmatic breathing 2x10  Bridge + diaphragmatic breathing 2x10  Sidelying clamshell + reverse clamshell + diaphragmatic breathing 2x10  Lower trunk rotations + diaphragmatic breathing 2x10  Self care: relative anatomy and the connection between the diaphragm and pelvic floor; how to correctly perform a pelvic floor contraction vs pushing down through the pelvic floor  Blow as you go technique for managing intraabdominal/pelvic floor pressure  Toileting mechanics for complete emptying of stool/urine   03/13/24: Neuro re-ed: standing diaphragmatic breathing + pelvic floor lengthening with inhalation and shortening with exhalation (cueing to draw bellybutton in as she blows out) 3x10  Sidelying hip abduction + diaphragmatic breathing 2x10  Seated adductor ball squeeze + internal rotation + diaphragmatic breathing 2x10 each side  Therapeutic exercise: Sit to  stand + adductor ball squeeze +  diaphragmatic breathing 2x10  Bridge + adductor ball squeeze + diaphragmatic breathing 2x10  Sidelying hip abduction + diaphragmatic breathing 2x10  Lower trunk rotations + diaphragmatic breathing 2x10  Open books + diaphragmatic breathing 2x10 each side  Self care: relative anatomy and the connection between the diaphragm and pelvic floor; how to correctly perform a pelvic floor contraction vs pushing down through the pelvic floor  Blow as you go technique for managing intraabdominal/pelvic floor pressure  Toileting mechanics for complete emptying of stool/urine  Abdominal massage education and training for constipation management   PATIENT EDUCATION:  Education details: relative anatomy and the connection between the diaphragm and pelvic floor; how to correctly perform a pelvic  floor contraction vs pushing down through the pelvic floor  Person educated: Patient Education method: Explanation, Demonstration, Tactile cues, Verbal cues, and Handouts Education comprehension: verbalized understanding, returned demonstration, verbal cues required, tactile cues required, and needs further education  HOME EXERCISE PROGRAM: Access Code: [email or web address] URL: [email or web address] Date: 03/13/2024 Prepared by: Celena Domino  Exercises - Standing Pelvic Floor Contraction  - 1 x daily - 7 x weekly - 2 sets - 10 reps - Sit to Stand with Mercer Between Knees  - 1 x daily - 7 x weekly - 2 sets - 8 reps - Supine Bridge with Mini Swiss Ball Between Knees  - 1 x daily - 7 x weekly - 2 sets - 10 reps - Sidelying Hip Abduction  - 1 x daily - 7 x weekly - 2 sets - 10 reps - Seated Hip Internal Rotation with Ball and Resistance  - 1 x daily - 7 x weekly - 2 sets - 20 reps - Supine Lower Trunk Rotation  - 1 x daily - 7 x weekly - 2 sets - 10 reps - Sidelying Thoracic Rotation with Open Book  - 1 x daily - 7 x weekly - 2 sets - 10 reps  ASSESSMENT:  CLINICAL  IMPRESSION: Patient is a 73 y.o. female  who was seen today for physical therapy treatment for post-hysterectomy care, urinary urgency, and strengthening of the musculature s/p partial hysterectomy 01/10/24. 0/10 pain upon arrival with no urinary or bowel concerns to report. Patient feels less heaviness in the general pelvic region since starting PFPT. Glute and hip strengthening progressed today and education provided on abdominal massage training for constipation management. Pelvic floor training progressed to standing position today to challenge the musculature further. Overall, patient tolerated session very well and Pt would benefit from additional PT to further address deficits.    OBJECTIVE IMPAIRMENTS: decreased coordination, decreased endurance, decreased mobility, decreased ROM, and decreased strength.   ACTIVITY LIMITATIONS: continence  PARTICIPATION LIMITATIONS: cleaning, laundry, driving, community activity, occupation, and yard work  PERSONAL FACTORS: Age, Past/current experiences, and Time since onset of injury/illness/exacerbation are also affecting patient's functional outcome.   REHAB POTENTIAL: Excellent  CLINICAL DECISION MAKING: Stable/uncomplicated  EVALUATION COMPLEXITY: Low   GOALS: Goals reviewed with patient? Yes  SHORT TERM GOALS: Target date: 03/27/2024  Pt will be independent with HEP.  Baseline: Goal status: INITIAL  2.  Pt will be independent with diaphragmatic breathing and down training activities in order to improve pelvic floor relaxation.  Baseline:  Goal status: INITIAL  3.  Pt will be independent with the knack, urge suppression technique, and double voiding in order to improve bladder habits and decrease urinary incontinence.   Baseline:  Goal status: INITIAL  LONG TERM GOALS: Target date: 08/29/2024  Pt will be independent with advanced HEP.  Baseline:  Goal status: INITIAL  2.  Pt will be able to correctly perform diaphragmatic  breathing and appropriate pressure management in order to prevent worsening vaginal wall laxity and improve pelvic floor A/ROM.  Baseline:  Goal status: INITIAL  3.  Pt to demonstrate improved coordination of pelvic floor and breathing mechanics with 10# squat with appropriate synergistic patterns to decrease pain and leakage at least 75% of the time for improved ability to complete a 30 minute workout with strain at pelvic floor and symptoms.   Baseline:  Goal status: INITIAL  4.  Pt will demonstrate normal pelvic floor muscle tone and A/ROM, able to achieve 4/5 strength with contractions and 10 sec  endurance, in order to provide appropriate lumbopelvic support in functional activities.   Baseline:  Goal status: INITIAL  PLAN:  PT FREQUENCY: 1-2x/week  PT DURATION: 12 weeks  PLANNED INTERVENTIONS: 97110-Therapeutic exercises, 97530- Therapeutic activity, 97112- Neuromuscular re-education, 97535- Self Care, [id number]- Manual therapy, [phone]- Aquatic Therapy, Patient/Family education, Taping, Joint mobilization, Spinal mobilization, Scar mobilization, Cryotherapy, and Moist heat  PLAN FOR NEXT SESSION: continued internal vaginal treatment to assess hooklying pelvic floor AROM, introduce hip and core strengthening  Celena JAYSON Domino, PT 03/13/2024, 1:07 PM

## 2024-03-20 ENCOUNTER — Ambulatory Visit (INDEPENDENT_AMBULATORY_CARE_PROVIDER_SITE_OTHER): Admitting: Sports Medicine

## 2024-03-20 ENCOUNTER — Telehealth: Payer: Self-pay | Admitting: Physical Therapy

## 2024-03-20 VITALS — BP 130/82 | HR 74 | Ht 63.0 in | Wt 193.0 lb

## 2024-03-20 DIAGNOSIS — M76891 Other specified enthesopathies of right lower limb, excluding foot: Secondary | ICD-10-CM

## 2024-03-20 DIAGNOSIS — M7061 Trochanteric bursitis, right hip: Secondary | ICD-10-CM | POA: Diagnosis not present

## 2024-03-20 NOTE — Telephone Encounter (Signed)
 PT called patient due to right hip pain flare. Patient advised to avoid all aggravating exercises at this time and rest due to flare. She plans to attend 03/27/24 appt.  Celena Domino, PT, DPT 03/20/24 9:08 AM

## 2024-03-20 NOTE — Patient Instructions (Signed)
 You have bursitis of the hip  Recommend limiting gluteal exercises at PT  Tylenol  [phone] mg 2-3 times a day for pain relief  NSAIDs as needed  As needed follow up if no improvement 3 week follow up

## 2024-03-20 NOTE — Progress Notes (Signed)
 Ben Nohealani Medinger D.[facility] [address] [place] [address] Phone: [phone]   Assessment and Plan:     1. Greater trochanteric bursitis of right hip 2. Hip abductor tendinitis, right  -Chronic with exacerbation, subsequent visit - Consistent with right greater trochanteric bursitis, likely flared due to recent pelvic floor physical therapy, side sleeping.  Patient does have history of underlying mild gluteal muscular tearing as seen last year on MRI that could contribute to symptoms - Patient elected for greater trochanteric CSI.  Tolerated well per note below - Recommend continuing exercise as tolerated.  May decrease gluteal muscle exercises to decrease risk of recurrent flare - Use Tylenol  500 to 1000 mg tablets 2-3 times a day for day-to-day pain relief -Discussed prescription NSAID versus prednisone course, but elected to proceed with injection at today's visit instead.  Procedure: Greater trochanteric bursal injection Side: Right  Risks explained and consent was given verbally. The site was cleaned with alcohol prep. A steroid injection was performed with patient in the lateral side-lying position at area of maximum tenderness over greater trochanter using 2mL of 1% lidocaine  without epinephrine  and 1mL of kenalog  40mg /ml. This was well tolerated and resulted in symptomatic relief.  Needle was removed, hemostasis achieved, and post injection instructions were explained.  Pt was advised to call or return to clinic if these symptoms worsen or fail to improve as anticipated.    Pertinent previous records reviewed include none  Follow Up: As needed if no improvement or worsening of symptoms.  Could consider NSAID versus prednisone course   Subjective:   I, Moenique Parris, am serving as a Neurosurgeon for Doctor Morene Mace  Chief Complaint: hip pain   HPI:   01/20/2023 Amy Harrington is a 73 y.o. female who presents to [facility] today for f/u R hip pain due to gluteus medius partial tear and tendinopathy. Patient is currently under the care of oncology for a malignant neoplasm of the right breast. Pt was last seen by Dr. Joane on 12/09/22 and was referred to PT, completing 4 visits.    Today, pt reports improvement of sx with PT. Sx are less severe and occurring less often. Able to sleep on the left side now. Sx will flare up after prolonged time standing or walking. Denies new or worsening sx. Compliant with HEP provided by PT.    Dx imaging: 11/23/2022 pelvis MRI             11/19/2022 right hip x-ray   Pertinent review of systems: No fevers or chills   Relevant historical information: History of breast cancer 2020  03/20/24 Patient is a 73 year old female with hip pain. Patient states pain came back 3-4 days ago. 8 weeks ago hysterectomy was in PT and thinks that might have flared the pain. Pain when walking intermittent. Ibu has been helping. Does have pain when laying or sitting.     Relevant Historical Information: GERD, history of breast cancer  Additional pertinent review of systems negative.   Current Outpatient Medications:    esomeprazole (NEXIUM) 20 MG capsule, Take 20 mg by mouth daily., Disp: , Rfl:    estradiol  (ESTRACE ) 0.1 MG/GM vaginal cream, Place 0.5 g vaginally 2 (two) times a week. Place 0.5g nightly for two weeks then twice a week after, Disp: 42.5 g, Rfl: 11   nystatin  (MYCOSTATIN /NYSTOP ) powder, Apply 1 Application topically 3 (three) times daily., Disp: 15 g, Rfl: 0  tamoxifen  (NOLVADEX ) 20 MG tablet, TAKE 1 TABLET EVERY DAY, Disp: 90 tablet, Rfl: 3   Objective:     Vitals:   03/20/24 1049  BP: 130/82  Pulse: 74  SpO2: 98%  Weight: 193 lb (87.5 kg)  Height: 5' 3 (1.6 m)      Body mass index is 34.19 kg/m.    Physical Exam:    General: awake, alert, and oriented no acute distress, nontoxic Skin: no suspicious lesions or rashes Neuro:sensation intact  distally with no deficits, normal muscle tone, no atrophy, strength 5/5 in all tested lower ext groups Psych: normal mood and affect, speech clear   Right hip: No deformity, swelling or wasting ROM Flexion 90, ext 30, IR 45, ER 45 TTP significantly greater trochanter, moderately IT band and gluteal musculature NTTP over the hip flexors,  , si joint, lumbar spine Negative log roll with FROM Negative FABER Negative FADIR Negative Piriformis test for radicular symptoms, the reproduced lateral hip pain  Gait normal    Electronically signed by:  Odis Mace D.[facility] 12:04 PM 03/20/24

## 2024-03-27 ENCOUNTER — Ambulatory Visit: Payer: Self-pay | Attending: Obstetrics and Gynecology | Admitting: Physical Therapy

## 2024-03-27 DIAGNOSIS — M6281 Muscle weakness (generalized): Secondary | ICD-10-CM | POA: Diagnosis present

## 2024-03-27 DIAGNOSIS — R279 Unspecified lack of coordination: Secondary | ICD-10-CM | POA: Diagnosis present

## 2024-03-27 NOTE — Therapy (Signed)
 OUTPATIENT PHYSICAL THERAPY FEMALE PELVIC TREATMENT   Patient Name: Amy Harrington MRN: [id number] DOB:02/14/1951, 73 y.o., female Today's Date: 03/27/2024  END OF SESSION:  PT End of Session - 03/27/24 1057     Visit Number 4    Number of Visits 10    Date for PT Re-Evaluation 03/27/24    Authorization Type Medicare    PT Start Time 1015    PT Stop Time 1057    PT Time Calculation (min) 42 min    Activity Tolerance Patient tolerated treatment well    Behavior During Therapy [facility] for tasks assessed/performed             Past Medical History:  Diagnosis Date   GERD (gastroesophageal reflux disease)    History of cancer chemotherapy    right breast cancer;   10-04-2019  to  12-20-2019   History of external beam radiation therapy    right breast cancer;   01-17-2020  to  02-12-2020   Malignant neoplasm of 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, estrogen receptor positive (HCC) 07/2019   onologist---  dr catrina  surgeon---  dr aron;  dx 11/ 2020;   09-07-2019 s/p right lumpectomy w/ node dissection;  Stage 1a,G2, IDC ;   completed adjuvant chemo 12-20-2019;  completed IMRT 02-12-2020   OAB (overactive bladder)    Plantar fasciitis    intermittant   PONV (postoperative nausea and vomiting)    Presence of pessary    Prolapse of female pelvic organs    SUI (stress urinary incontinence, female)    Past Surgical History:  Procedure Laterality Date   BLADDER SUSPENSION N/A 01/10/2024   Procedure: CREATION, URETHRAL SLING, RETROPUBIC APPROACH, USING POLYPROPYLENE TAPE;  Surgeon: Marilynne Rosaline SAILOR, MD;  Location: [facility] OR;  Service: Gynecology;  Laterality: N/A;  midrurethral sling   BREAST LUMPECTOMY WITH RADIOACTIVE SEED AND SENTINEL LYMPH NODE BIOPSY Right 09/07/2019   Procedure: RIGHT BREAST LUMPECTOMY WITH RADIOACTIVE SEED AND SENTINEL LYMPH NODE BIOPSY;  Surgeon: aron Shoulders, MD;  Location: [facility];  Service: General;  Laterality: Right;    CATARACT EXTRACTION W/ INTRAOCULAR LENS IMPLANT Bilateral 2021   COLONOSCOPY WITH PROPOFOL   2023   dr schooler   COLPOCLEISIS N/A 01/10/2024   Procedure: COLPOCLEISIS;  Surgeon: Marilynne Rosaline SAILOR, MD;  Location: [facility] OR;  Service: Gynecology;  Laterality: N/A;   CYSTOSCOPY N/A 01/10/2024   Procedure: CYSTOSCOPY;  Surgeon: Marilynne Rosaline SAILOR, MD;  Location: [facility] OR;  Service: Gynecology;  Laterality: N/A;   INGUINAL HERNIA REPAIR Left 1973   PORT-A-CATH REMOVAL N/A 09/16/2020   Procedure: REMOVAL PORT-A-CATH;  Surgeon: aron Shoulders, MD;  Location: [facility];  Service: General;  Laterality: N/A;   PORTACATH PLACEMENT Left 09/07/2019   Procedure: INSERTION PORT-A-CATH WITH ULTRASOUND;  Surgeon: aron Shoulders, MD;  Location:  SURGERY CENTER;  Service: General;  Laterality: Left;   RECTOCELE REPAIR N/A 01/10/2024   Procedure: COLPORRHAPHY, POSTERIOR, FOR RECTOCELE REPAIR;  Surgeon: Marilynne Rosaline SAILOR, MD;  Location: [facility] OR;  Service: Gynecology;  Laterality: N/A;  -levator plication with perineorrhaphy   VAGINAL HYSTERECTOMY N/A 01/10/2024   Procedure: HYSTERECTOMY, VAGINAL;  Surgeon: Marilynne Rosaline SAILOR, MD;  Location: [facility] OR;  Service: Gynecology;  Laterality: N/A;   Patient Active Problem List   Diagnosis Date Noted   Tear of gluteus medius tendon 12/09/2022   Malignant neoplasm of 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, estrogen receptor positive (HCC) 08/17/2019   Gastroesophageal reflux disease 10/10/2017  PCP: Claudene Pellet, MD  REFERRING PROVIDER: Marilynne Rosaline SAILOR, MD   REFERRING DIAG: [phone] (ICD-10-CM) - Uterovaginal prolapse, incomplete N39.3 (ICD-10-CM) - SUI (stress urinary incontinence, female)  THERAPY DIAG:  Muscle weakness (generalized)  Unspecified lack of coordination  Rationale for Evaluation and Treatment: Rehabilitation  ONSET DATE: 01/10/24  SUBJECTIVE:                                                                                                                                                                                            SUBJECTIVE STATEMENT: [name]  Patient reports that her right hip started hurting badly - it felt like sharpness. She is still a little sore in the right hip and she got a steroid injection - she had some bursitis. She got the steroid injection last Tuesday. No urinary related leakage or concerns to report. No other bowel related concerns to report.   From eval: Patient reports to pelvic PT after partial hysterectomy 01/10/24. She has been feeling tired since the surgery. 0/10 pain to report. Some urinary concerns since surgery, including urgency that almost leads to incontinence if she is not able to make it to the bathroom on time. Fluid intake: 3-4 8oz water bottles per day; drinks some diet coke (1-2/day), occasionally tea or lemonade   PAIN:  Are you having pain? No NPRS scale: 0/10  PRECAUTIONS: None  RED FLAGS: None   WEIGHT BEARING RESTRICTIONS: No  FALLS:  Has patient fallen in last 6 months? 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, sitting majority of day   ACTIVITY LEVEL : walks 10k steps per day, housework and yardwork   PLOF: Independent with basic ADLs  PATIENT GOALS: to get stronger overall with functional tasks after hysterectomy   PERTINENT HISTORY:  Partial hysterectomy 01/10/24, hernia repair 20+ years ago  Sexual abuse: No  BOWEL MOVEMENT: Pain with bowel movement: No Type of bowel movement:Type (Bristol Stool Scale) 4, Frequency 1-2x/day, Strain no, and Splinting no Fully empty rectum: Yes:   Leakage: No Pads: No Fiber supplement/laxative sometimes if needed - miralax    URINATION: Pain with urination: No Fully empty bladder: Yes:   Stream: Strong Urgency: Yes  Frequency: within normal limits  Leakage: Urge to void Pads: Yes: in the last month due to leakage   INTERCOURSE:  Ability to have vaginal  penetration No  Pain with intercourse: none DrynessNo  PREGNANCY: Vaginal deliveries 2 Tearing Yes: tearing with first   PROLAPSE: None  OBJECTIVE:  Note: Objective measures were completed at Evaluation unless otherwise noted.  PATIENT SURVEYS:  PFIQ-7: 16  COGNITION: Overall cognitive  status: Within functional limits for tasks assessed     SENSATION: Light touch: Appears intact  LUMBAR SPECIAL TESTS:  Prone instability test: Positive  FUNCTIONAL TESTS:  Squat: lateral weight shift to the right, right LE dynamic knee valgus with loading   GAIT: Assistive device utilized: None Comments: moderate trendelenburg gait pattern with ambulation   POSTURE: rounded shoulders, forward head, and flexed trunk   LUMBARAROM/PROM:  A/PROM A/PROM  eval  Flexion 25% limited  Extension 50% limited  Right lateral flexion 25% limited  Left lateral flexion 25% limited  Right rotation 25% limited  Left rotation 25% limited   (Blank rows = not tested)  LOWER EXTREMITY ROM: within functional limits   Active ROM Right eval Left eval  Hip flexion    Hip extension    Hip abduction    Hip adduction    Hip internal rotation    Hip external rotation    Knee flexion    Knee extension    Ankle dorsiflexion    Ankle plantarflexion    Ankle inversion    Ankle eversion     (Blank rows = not tested)  LOWER EXTREMITY MMT:  MMT Right eval Left eval  Hip flexion 4 4  Hip extension 4 4  Hip abduction 3+ 3+  Hip adduction 3+ 3+  Hip internal rotation    Hip external rotation    Knee flexion 4 4  Knee extension 4 4  Ankle dorsiflexion    Ankle plantarflexion    Ankle inversion    Ankle eversion     (Blank rows = not tested) PALPATION:   General: no significant tenderness to palpation of bilateral adductors or hip flexors   Pelvic Alignment: within normal limits   Abdominal: upper chest breathing and abdominal bracing present at rest with decreased lower rib excursion  with inhalation                 External Perineal Exam: dryness present with sufficient clitoral hood mobility                              Internal Pelvic Floor: Patient was unable to perform a pelvic floor contraction independently in the hooklying position today. With cueing to engage her pelvic floor, she tends to push down and out rather than drawing the musculature up and in, demonstrating a lack of coordination. With cueing to engage her transverse abdominis, patient was able to slightly lift the pelvic floor with exhalation. She is able to actively lengthen her pelvic floor very well. She plans to work on this coordination with this exercise in the hooklying position. Patient demonstrates normal tone throughout superficial and deep pelvic floor musculature bilaterally, and had no pain with today's examination.   Patient confirms identification and approves PT to assess internal pelvic floor and treatment Yes No emotional/communication barriers or cognitive limitation. Patient is motivated to learn. Patient understands and agrees with treatment goals and plan. PT explains patient will be examined in standing, sitting, and lying down to see how their muscles and joints work. When they are ready, they will be asked to remove their underwear so PT can examine their perineum. The patient is also given the option of providing their own chaperone as one is not provided in our facility. The patient also has the right and is explained the right to defer or refuse any part of the evaluation or treatment including the internal exam. With  the patient's consent, PT will use one gloved finger to gently assess the muscles of the pelvic floor, seeing how well it contracts and relaxes and if there is muscle symmetry. After, the patient will get dressed and PT and patient will discuss exam findings and plan of care. PT and patient discuss plan of care, schedule, attendance policy and HEP activities.  PELVIC MMT:    MMT eval  Vaginal Unable to perform pelvic floor contraction independently.  Internal Anal Sphincter   External Anal Sphincter   Puborectalis   Diastasis Recti   (Blank rows = not tested)       TONE: Within normal limits   PROLAPSE: N/A in hooklying   TODAY'S TREATMENT:                                                                                                                              DATE:   03/06/24: Neuro re-ed: Hooklying diaphragmatic breathing + pelvic floor lengthening with inhalation and shortening with exhalation (cueing to draw bellybutton in as she blows out) 3x10  Therapeutic exercise: Sit to stand + diaphragmatic breathing 2x10  Bridge + diaphragmatic breathing 2x10  Sidelying clamshell + reverse clamshell + diaphragmatic breathing 2x10  Lower trunk rotations + diaphragmatic breathing 2x10  Self care: relative anatomy and the connection between the diaphragm and pelvic floor; how to correctly perform a pelvic floor contraction vs pushing down through the pelvic floor  Blow as you go technique for managing intraabdominal/pelvic floor pressure  Toileting mechanics for complete emptying of stool/urine   03/13/24: Neuro re-ed: standing diaphragmatic breathing + pelvic floor lengthening with inhalation and shortening with exhalation (cueing to draw bellybutton in as she blows out) 3x10  Sidelying hip abduction + diaphragmatic breathing 2x10  Seated adductor ball squeeze + internal rotation + diaphragmatic breathing 2x10 each side  Therapeutic exercise: Sit to stand + adductor ball squeeze +  diaphragmatic breathing 2x10  Bridge + adductor ball squeeze + diaphragmatic breathing 2x10  Sidelying hip abduction + diaphragmatic breathing 2x10  Lower trunk rotations + diaphragmatic breathing 2x10  Open books + diaphragmatic breathing 2x10 each side  Self care: relative anatomy and the connection between the diaphragm and pelvic floor; how to correctly perform a pelvic  floor contraction vs pushing down through the pelvic floor  Blow as you go technique for managing intraabdominal/pelvic floor pressure  Toileting mechanics for complete emptying of stool/urine  Abdominal massage education and training for constipation management   03/27/24: Neuro re-ed: standing diaphragmatic breathing + pelvic floor lengthening with inhalation and shortening with exhalation (cueing to draw bellybutton in as she blows out) 3x10  Hooklying single leg march + diaphragmatic breathing 2x10  Therapeutic exercise: Sit to stand +  diaphragmatic breathing 2x10  Bridge + diaphragmatic breathing 2x10  Lower trunk rotations + diaphragmatic breathing 2x10  Supine single knee to chest stretch + diaphragmatic breathing 2x16min each side  Supine piriformis stretch + diaphragmatic breathing 2x72min  Self care: relative anatomy and the connection between the diaphragm and pelvic floor; how to correctly perform a pelvic floor contraction vs pushing down through the pelvic floor  Blow as you go technique for managing intraabdominal/pelvic floor pressure  Toileting mechanics for complete emptying of stool/urine  Abdominal massage education and training for constipation management   PATIENT EDUCATION:  Education details: relative anatomy and the connection between the diaphragm and pelvic floor; how to correctly perform a pelvic floor contraction vs pushing down through the pelvic floor  Person educated: Patient Education method: Explanation, Demonstration, Tactile cues, Verbal cues, and Handouts Education comprehension: verbalized understanding, returned demonstration, verbal cues required, tactile cues required, and needs further education  HOME EXERCISE PROGRAM: Access Code: [email or web address] URL: [email or web address] Date: 03/27/2024 Prepared by: Celena Domino  Exercises - Standing Pelvic Floor Contraction  - 1 x daily - 7 x weekly - 2 sets - 10 reps - Sit to Stand Without Arm  Support  - 1 x daily - 7 x weekly - 2 sets - 10 reps - Supine Bridge  - 1 x daily - 7 x weekly - 2 sets - 10 reps - Supine March  - 1 x daily - 7 x weekly - 2 sets - 10 reps - Quadruped Alternating Leg Extensions  - 1 x daily - 7 x weekly - 2 sets - 10 reps - Supine Lower Trunk Rotation  - 1 x daily - 7 x weekly - 2 sets - 10 reps - Supine Single Knee to Chest Stretch  - 1 x daily - 7 x weekly - 2 sets - hold - Supine Figure 4 Piriformis Stretch  - 1 x daily - 7 x weekly - 2 sets - hold  ASSESSMENT:  CLINICAL IMPRESSION: Patient is a 73 y.o. female  who was seen today for physical therapy treatment for post-hysterectomy care, urinary urgency, and strengthening of the musculature s/p partial hysterectomy 01/10/24. 0/10 pain upon arrival with no urinary or bowel concerns to report. She had significant soreness after last session that she thinks came from the addition of sidelying exercises and adduction with ball during exercises. We modified her HEP accordingly and she had no increase in right hip pain during today's session. Overall, patient tolerated session very well and Pt would benefit from additional PT to further address deficits.    OBJECTIVE IMPAIRMENTS: decreased coordination, decreased endurance, decreased mobility, decreased ROM, and decreased strength.   ACTIVITY LIMITATIONS: continence  PARTICIPATION LIMITATIONS: cleaning, laundry, driving, community activity, occupation, and yard work  PERSONAL FACTORS: Age, Past/current experiences, and Time since onset of injury/illness/exacerbation are also affecting patient's functional outcome.   REHAB POTENTIAL: Excellent  CLINICAL DECISION MAKING: Stable/uncomplicated  EVALUATION COMPLEXITY: Low   GOALS: Goals reviewed with patient? Yes  SHORT TERM GOALS: Target date: 03/27/2024  Pt will be independent with HEP.  Baseline: Goal status: INITIAL  2.  Pt will be independent with diaphragmatic breathing and down training  activities in order to improve pelvic floor relaxation.  Baseline:  Goal status: INITIAL  3.  Pt will be independent with the knack, urge suppression technique, and double voiding in order to improve bladder habits and decrease urinary incontinence.   Baseline:  Goal status: INITIAL  LONG TERM GOALS: Target date: 08/29/2024  Pt will be independent with advanced HEP.  Baseline:  Goal status: INITIAL  2.  Pt will be able to correctly perform diaphragmatic breathing and appropriate pressure management in order to prevent worsening vaginal wall laxity and  improve pelvic floor A/ROM.  Baseline:  Goal status: INITIAL  3.  Pt to demonstrate improved coordination of pelvic floor and breathing mechanics with 10# squat with appropriate synergistic patterns to decrease pain and leakage at least 75% of the time for improved ability to complete a 30 minute workout with strain at pelvic floor and symptoms.   Baseline:  Goal status: INITIAL  4.  Pt will demonstrate normal pelvic floor muscle tone and A/ROM, able to achieve 4/5 strength with contractions and 10 sec endurance, in order to provide appropriate lumbopelvic support in functional activities.   Baseline:  Goal status: INITIAL  PLAN:  PT FREQUENCY: 1-2x/week  PT DURATION: 12 weeks  PLANNED INTERVENTIONS: 97110-Therapeutic exercises, 97530- Therapeutic activity, 97112- Neuromuscular re-education, 97535- Self Care, [id number]- Manual therapy, [phone]- Aquatic Therapy, Patient/Family education, Taping, Joint mobilization, Spinal mobilization, Scar mobilization, Cryotherapy, and Moist heat  PLAN FOR NEXT SESSION: continued internal vaginal treatment to assess hooklying pelvic floor AROM, introduce hip and core strengthening  Celena JAYSON Domino, PT 03/27/2024, 10:57 AM

## 2024-03-28 ENCOUNTER — Ambulatory Visit (INDEPENDENT_AMBULATORY_CARE_PROVIDER_SITE_OTHER): Admitting: Obstetrics and Gynecology

## 2024-03-28 ENCOUNTER — Encounter: Payer: Self-pay | Admitting: Obstetrics and Gynecology

## 2024-03-28 VITALS — BP 132/79 | HR 77

## 2024-03-28 DIAGNOSIS — Z9889 Other specified postprocedural states: Secondary | ICD-10-CM

## 2024-03-28 DIAGNOSIS — Z48816 Encounter for surgical aftercare following surgery on the genitourinary system: Secondary | ICD-10-CM

## 2024-03-28 NOTE — Progress Notes (Signed)
  Urogynecology  Date of Visit: 03/28/2024  History of Present Illness: Amy Harrington is a 73 y.o. female scheduled today for a post-operative visit.   Surgery: s/p Total vaginal hysterectomy, colpocleisis, levator plication, perineorrhaphy, midurethral sling, cystoscopy on 01/10/24  She did not pass her postoperative void trial. Returned 01/12/24 and passed voiding trial  Had a flare up of right hip pain after pelvic PT. Has to get an injection by ortho.   Leakage with urgency has also improved. She has been using the estrogen cream.   Medications: She has a current medication list which includes the following prescription(s): esomeprazole, estradiol , nystatin , and tamoxifen .   Allergies: Patient is allergic to anastrozole  and letrozole .   Physical Exam: BP 132/79   Pulse 77    Suprapubic Incisions: healing well.  Pelvic Examination: Perineal incision well healed. Vaginal cuff healed, no sutures present, no visible or palpable mesh   ---------------------------------------------------------  Assessment and Plan:  1. Post-operative state     - Can continue vaginal estrogen for atrophy if desired - Well healed. OK to resume regular activity. - Follow up as needed if urgency worsens. Otherwise continue with pelvic PT.    Rosaline LOISE Caper, MD

## 2024-04-12 ENCOUNTER — Ambulatory Visit: Admitting: Physical Therapy

## 2024-04-12 DIAGNOSIS — M6281 Muscle weakness (generalized): Secondary | ICD-10-CM

## 2024-04-12 DIAGNOSIS — R279 Unspecified lack of coordination: Secondary | ICD-10-CM

## 2024-04-12 NOTE — Therapy (Signed)
 OUTPATIENT PHYSICAL THERAPY FEMALE PELVIC TREATMENT   Patient Name: Amy Harrington MRN: [id number] DOB:30-Aug-1951, 73 y.o., female Today's Date: 04/12/2024  END OF SESSION:  PT End of Session - 04/12/24 1433     Visit Number 5    Number of Visits 10    Date for PT Re-Evaluation 03/27/24    Authorization Type Medicare    PT Start Time 0200    PT Stop Time 0235    PT Time Calculation (min) 35 min    Activity Tolerance Patient tolerated treatment well    Behavior During Therapy [facility] for tasks assessed/performed              Past Medical History:  Diagnosis Date   GERD (gastroesophageal reflux disease)    History of cancer chemotherapy    right breast cancer;   10-04-2019  to  12-20-2019   History of external beam radiation therapy    right breast cancer;   01-17-2020  to  02-12-2020   Malignant neoplasm of 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, estrogen receptor positive (HCC) 07/2019   onologist---  dr catrina  surgeon---  dr aron;  dx 11/ 2020;   09-07-2019 s/p right lumpectomy w/ node dissection;  Stage 1a,G2, IDC ;   completed adjuvant chemo 12-20-2019;  completed IMRT 02-12-2020   OAB (overactive bladder)    Plantar fasciitis    intermittant   PONV (postoperative nausea and vomiting)    Presence of pessary    Prolapse of female pelvic organs    SUI (stress urinary incontinence, female)    Past Surgical History:  Procedure Laterality Date   BLADDER SUSPENSION N/A 01/10/2024   Procedure: CREATION, URETHRAL SLING, RETROPUBIC APPROACH, USING POLYPROPYLENE TAPE;  Surgeon: Marilynne Rosaline SAILOR, MD;  Location: [facility] OR;  Service: Gynecology;  Laterality: N/A;  midrurethral sling   BREAST LUMPECTOMY WITH RADIOACTIVE SEED AND SENTINEL LYMPH NODE BIOPSY Right 09/07/2019   Procedure: RIGHT BREAST LUMPECTOMY WITH RADIOACTIVE SEED AND SENTINEL LYMPH NODE BIOPSY;  Surgeon: aron Shoulders, MD;  Location: [facility];  Service: General;  Laterality: Right;    CATARACT EXTRACTION W/ INTRAOCULAR LENS IMPLANT Bilateral 2021   COLONOSCOPY WITH PROPOFOL   2023   dr schooler   COLPOCLEISIS N/A 01/10/2024   Procedure: COLPOCLEISIS;  Surgeon: Marilynne Rosaline SAILOR, MD;  Location: [facility] OR;  Service: Gynecology;  Laterality: N/A;   CYSTOSCOPY N/A 01/10/2024   Procedure: CYSTOSCOPY;  Surgeon: Marilynne Rosaline SAILOR, MD;  Location: [facility] OR;  Service: Gynecology;  Laterality: N/A;   INGUINAL HERNIA REPAIR Left 1973   PORT-A-CATH REMOVAL N/A 09/16/2020   Procedure: REMOVAL PORT-A-CATH;  Surgeon: aron Shoulders, MD;  Location: [facility];  Service: General;  Laterality: N/A;   PORTACATH PLACEMENT Left 09/07/2019   Procedure: INSERTION PORT-A-CATH WITH ULTRASOUND;  Surgeon: aron Shoulders, MD;  Location: [facility];  Service: General;  Laterality: Left;   RECTOCELE REPAIR N/A 01/10/2024   Procedure: COLPORRHAPHY, POSTERIOR, FOR RECTOCELE REPAIR;  Surgeon: Marilynne Rosaline SAILOR, MD;  Location: [facility];  Service: Gynecology;  Laterality: N/A;  -levator plication with perineorrhaphy   VAGINAL HYSTERECTOMY N/A 01/10/2024   Procedure: HYSTERECTOMY, VAGINAL;  Surgeon: Marilynne Rosaline SAILOR, MD;  Location: [facility] OR;  Service: Gynecology;  Laterality: N/A;   Patient Active Problem List   Diagnosis Date Noted   Tear of gluteus medius tendon 12/09/2022   Malignant neoplasm of 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, estrogen receptor positive (HCC) 08/17/2019   Gastroesophageal reflux disease  10/10/2017    PCP: Claudene Pellet, MD  REFERRING PROVIDER: Marilynne Rosaline SAILOR, MD   REFERRING DIAG: [phone] (ICD-10-CM) - Uterovaginal prolapse, incomplete N39.3 (ICD-10-CM) - SUI (stress urinary incontinence, female)  THERAPY DIAG:  Muscle weakness (generalized)  Unspecified lack of coordination  Rationale for Evaluation and Treatment: Rehabilitation  ONSET DATE: 01/10/24  SUBJECTIVE:                                                                                                                                                                                            SUBJECTIVE STATEMENT: [name]  Her hip has been feeling better. No urinary related leakage or concerns to report. No other bowel related concerns to report. 0/10 pain today. She had a small amount of soreness after last session.   From eval: Patient reports to pelvic PT after partial hysterectomy 01/10/24. She has been feeling tired since the surgery. 0/10 pain to report. Some urinary concerns since surgery, including urgency that almost leads to incontinence if she is not able to make it to the bathroom on time. Fluid intake: 3-4 8oz water bottles per day; drinks some diet coke (1-2/day), occasionally tea or lemonade   PAIN:  Are you having pain? No NPRS scale: 0/10  PRECAUTIONS: None  RED FLAGS: None   WEIGHT BEARING RESTRICTIONS: No  FALLS:  Has patient fallen in last 6 months? 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, sitting majority of day   ACTIVITY LEVEL : walks 10k steps per day, housework and yardwork   PLOF: Independent with basic ADLs  PATIENT GOALS: to get stronger overall with functional tasks after hysterectomy   PERTINENT HISTORY:  Partial hysterectomy 01/10/24, hernia repair 20+ years ago  Sexual abuse: No  BOWEL MOVEMENT: Pain with bowel movement: No Type of bowel movement:Type (Bristol Stool Scale) 4, Frequency 1-2x/day, Strain no, and Splinting no Fully empty rectum: Yes:   Leakage: No Pads: No Fiber supplement/laxative sometimes if needed - miralax    URINATION: Pain with urination: No Fully empty bladder: Yes:   Stream: Strong Urgency: Yes  Frequency: within normal limits  Leakage: Urge to void Pads: Yes: in the last month due to leakage   INTERCOURSE:  Ability to have vaginal penetration No  Pain with intercourse: none DrynessNo  PREGNANCY: Vaginal deliveries 2 Tearing Yes: tearing with  first   PROLAPSE: None  OBJECTIVE:  Note: Objective measures were completed at Evaluation unless otherwise noted.  PATIENT SURVEYS:  PFIQ-7: 16  COGNITION: Overall cognitive status: Within functional limits for tasks assessed     SENSATION: Light touch: Appears intact  LUMBAR SPECIAL  TESTS:  Prone instability test: Positive  FUNCTIONAL TESTS:  Squat: lateral weight shift to the right, right LE dynamic knee valgus with loading   GAIT: Assistive device utilized: None Comments: moderate trendelenburg gait pattern with ambulation   POSTURE: rounded shoulders, forward head, and flexed trunk   LUMBARAROM/PROM:  A/PROM A/PROM  eval  Flexion 25% limited  Extension 50% limited  Right lateral flexion 25% limited  Left lateral flexion 25% limited  Right rotation 25% limited  Left rotation 25% limited   (Blank rows = not tested)  LOWER EXTREMITY ROM: within functional limits   Active ROM Right eval Left eval  Hip flexion    Hip extension    Hip abduction    Hip adduction    Hip internal rotation    Hip external rotation    Knee flexion    Knee extension    Ankle dorsiflexion    Ankle plantarflexion    Ankle inversion    Ankle eversion     (Blank rows = not tested)  LOWER EXTREMITY MMT:  MMT Right eval Left eval  Hip flexion 4 4  Hip extension 4 4  Hip abduction 3+ 3+  Hip adduction 3+ 3+  Hip internal rotation    Hip external rotation    Knee flexion 4 4  Knee extension 4 4  Ankle dorsiflexion    Ankle plantarflexion    Ankle inversion    Ankle eversion     (Blank rows = not tested) PALPATION:   General: no significant tenderness to palpation of bilateral adductors or hip flexors   Pelvic Alignment: within normal limits   Abdominal: upper chest breathing and abdominal bracing present at rest with decreased lower rib excursion with inhalation                 External Perineal Exam: dryness present with sufficient clitoral hood mobility                               Internal Pelvic Floor: Patient was unable to perform a pelvic floor contraction independently in the hooklying position today. With cueing to engage her pelvic floor, she tends to push down and out rather than drawing the musculature up and in, demonstrating a lack of coordination. With cueing to engage her transverse abdominis, patient was able to slightly lift the pelvic floor with exhalation. She is able to actively lengthen her pelvic floor very well. She plans to work on this coordination with this exercise in the hooklying position. Patient demonstrates normal tone throughout superficial and deep pelvic floor musculature bilaterally, and had no pain with today's examination.   Patient confirms identification and approves PT to assess internal pelvic floor and treatment Yes No emotional/communication barriers or cognitive limitation. Patient is motivated to learn. Patient understands and agrees with treatment goals and plan. PT explains patient will be examined in standing, sitting, and lying down to see how their muscles and joints work. When they are ready, they will be asked to remove their underwear so PT can examine their perineum. The patient is also given the option of providing their own chaperone as one is not provided in our facility. The patient also has the right and is explained the right to defer or refuse any part of the evaluation or treatment including the internal exam. With the patient's consent, PT will use one gloved finger to gently assess the muscles of the pelvic floor, seeing  how well it contracts and relaxes and if there is muscle symmetry. After, the patient will get dressed and PT and patient will discuss exam findings and plan of care. PT and patient discuss plan of care, schedule, attendance policy and HEP activities.  PELVIC MMT:   MMT eval  Vaginal Unable to perform pelvic floor contraction independently.  Internal Anal Sphincter   External Anal  Sphincter   Puborectalis   Diastasis Recti   (Blank rows = not tested)       TONE: Within normal limits   PROLAPSE: N/A in hooklying   TODAY'S TREATMENT:                                                                                                                              DATE:   03/13/24: Neuro re-ed: standing diaphragmatic breathing + pelvic floor lengthening with inhalation and shortening with exhalation (cueing to draw bellybutton in as she blows out) 3x10  Sidelying hip abduction + diaphragmatic breathing 2x10  Seated adductor ball squeeze + internal rotation + diaphragmatic breathing 2x10 each side  Therapeutic exercise: Sit to stand + adductor ball squeeze +  diaphragmatic breathing 2x10  Bridge + adductor ball squeeze + diaphragmatic breathing 2x10  Sidelying hip abduction + diaphragmatic breathing 2x10  Lower trunk rotations + diaphragmatic breathing 2x10  Open books + diaphragmatic breathing 2x10 each side  Self care: relative anatomy and the connection between the diaphragm and pelvic floor; how to correctly perform a pelvic floor contraction vs pushing down through the pelvic floor  Blow as you go technique for managing intraabdominal/pelvic floor pressure  Toileting mechanics for complete emptying of stool/urine  Abdominal massage education and training for constipation management   03/27/24: Neuro re-ed: standing diaphragmatic breathing + pelvic floor lengthening with inhalation and shortening with exhalation (cueing to draw bellybutton in as she blows out) 3x10  Hooklying single leg march + diaphragmatic breathing 2x10  Therapeutic exercise: Sit to stand +  diaphragmatic breathing 2x10  Bridge + diaphragmatic breathing 2x10  Lower trunk rotations + diaphragmatic breathing 2x10  Supine single knee to chest stretch + diaphragmatic breathing 2x9min each side  Supine piriformis stretch + diaphragmatic breathing 2x22min  Self care: relative anatomy and the  connection between the diaphragm and pelvic floor; how to correctly perform a pelvic floor contraction vs pushing down through the pelvic floor  Blow as you go technique for managing intraabdominal/pelvic floor pressure  Toileting mechanics for complete emptying of stool/urine  Abdominal massage education and training for constipation management   04/12/24: Neuro re-ed: standing diaphragmatic breathing + pelvic floor lengthening with inhalation and shortening with exhalation (cueing to draw bellybutton in as she blows out) 3x10  Hooklying single leg march + GTB around knees + diaphragmatic breathing 2x10  Therapeutic exercise: Sit to stand + hip abduction (GTB) + diaphragmatic breathing 2x10  Bridge + hip abduction (GTB) + diaphragmatic breathing 2x10  Standing hip extension + diaphragmatic breathing 2x10  Lower trunk rotations + diaphragmatic breathing 2x10  Supine single knee to chest stretch + diaphragmatic breathing 2x70min each side  Supine piriformis stretch + diaphragmatic breathing 2x43min  Self care: relative anatomy and the connection between the diaphragm and pelvic floor; how to correctly perform a pelvic floor contraction vs pushing down through the pelvic floor  Blow as you go technique for managing intraabdominal/pelvic floor pressure  Toileting mechanics for complete emptying of stool/urine  Abdominal massage education and training for constipation management   PATIENT EDUCATION:  Education details: relative anatomy and the connection between the diaphragm and pelvic floor; how to correctly perform a pelvic floor contraction vs pushing down through the pelvic floor  Person educated: Patient Education method: Explanation, Demonstration, Tactile cues, Verbal cues, and Handouts Education comprehension: verbalized understanding, returned demonstration, verbal cues required, tactile cues required, and needs further education  HOME EXERCISE PROGRAM: Access Code: [email or web address] URL:  [email or web address] Date: 04/12/2024 Prepared by: Celena Domino  Exercises - Standing Pelvic Floor Contraction  - 1 x daily - 7 x weekly - 2 sets - 10 reps - Sit to Stand with Resistance Around Legs  - 1 x daily - 7 x weekly - 2 sets - 10 reps - Supine Bridge with Resistance Band  - 1 x daily - 7 x weekly - 2 sets - 10 reps - Supine March with Resistance Band  - 1 x daily - 7 x weekly - 2 sets - 10 reps - Standing Hip Extension with Counter Support  - 1 x daily - 7 x weekly - 2 sets - 10 reps - Supine Lower Trunk Rotation  - 1 x daily - 7 x weekly - 2 sets - 10 reps - Supine Single Knee to Chest Stretch  - 1 x daily - 7 x weekly - 2 sets - hold - Supine Figure 4 Piriformis Stretch  - 1 x daily - 7 x weekly - 2 sets - hold  ASSESSMENT:  CLINICAL IMPRESSION: Patient is a 73 y.o. female  who was seen today for physical therapy treatment for post-hysterectomy care, urinary urgency, and strengthening of the musculature s/p partial hysterectomy 01/10/24. 0/10 pain upon arrival with no urinary or bowel concerns to report. She felt great after last session, therefore all exercises were progressed today in intensity with addition of theraband. We modified her HEP accordingly and she had no increase in right hip pain during today's session. Overall, patient tolerated session very well and Pt would benefit from additional PT to further address deficits.    OBJECTIVE IMPAIRMENTS: decreased coordination, decreased endurance, decreased mobility, decreased ROM, and decreased strength.   ACTIVITY LIMITATIONS: continence  PARTICIPATION LIMITATIONS: cleaning, laundry, driving, community activity, occupation, and yard work  PERSONAL FACTORS: Age, Past/current experiences, and Time since onset of injury/illness/exacerbation are also affecting patient's functional outcome.   REHAB POTENTIAL: Excellent  CLINICAL DECISION MAKING: Stable/uncomplicated  EVALUATION COMPLEXITY:  Low   GOALS: Goals reviewed with patient? Yes  SHORT TERM GOALS: Target date: 03/27/2024  Pt will be independent with HEP.  Baseline: Goal status: INITIAL  2.  Pt will be independent with diaphragmatic breathing and down training activities in order to improve pelvic floor relaxation.  Baseline:  Goal status: INITIAL  3.  Pt will be independent with the knack, urge suppression technique, and double voiding in order to improve bladder habits and decrease urinary incontinence.   Baseline:  Goal status: INITIAL  LONG TERM GOALS: Target date: 08/29/2024  Pt will be independent with  advanced HEP.  Baseline:  Goal status: INITIAL  2.  Pt will be able to correctly perform diaphragmatic breathing and appropriate pressure management in order to prevent worsening vaginal wall laxity and improve pelvic floor A/ROM.  Baseline:  Goal status: INITIAL  3.  Pt to demonstrate improved coordination of pelvic floor and breathing mechanics with 10# squat with appropriate synergistic patterns to decrease pain and leakage at least 75% of the time for improved ability to complete a 30 minute workout with strain at pelvic floor and symptoms.   Baseline:  Goal status: INITIAL  4.  Pt will demonstrate normal pelvic floor muscle tone and A/ROM, able to achieve 4/5 strength with contractions and 10 sec endurance, in order to provide appropriate lumbopelvic support in functional activities.   Baseline:  Goal status: INITIAL  PLAN:  PT FREQUENCY: 1-2x/week  PT DURATION: 12 weeks  PLANNED INTERVENTIONS: 97110-Therapeutic exercises, 97530- Therapeutic activity, 97112- Neuromuscular re-education, 97535- Self Care, [id number]- Manual therapy, [phone]- Aquatic Therapy, Patient/Family education, Taping, Joint mobilization, Spinal mobilization, Scar mobilization, Cryotherapy, and Moist heat  PLAN FOR NEXT SESSION: continued internal vaginal treatment to assess hooklying pelvic floor AROM, introduce hip and core  strengthening  Celena JAYSON Domino, PT 04/12/2024, 2:34 PM

## 2024-04-24 ENCOUNTER — Ambulatory Visit: Attending: Obstetrics and Gynecology | Admitting: Physical Therapy

## 2024-04-24 DIAGNOSIS — R293 Abnormal posture: Secondary | ICD-10-CM | POA: Diagnosis present

## 2024-04-24 DIAGNOSIS — R279 Unspecified lack of coordination: Secondary | ICD-10-CM | POA: Diagnosis present

## 2024-04-24 DIAGNOSIS — M25551 Pain in right hip: Secondary | ICD-10-CM | POA: Diagnosis present

## 2024-04-24 DIAGNOSIS — M6281 Muscle weakness (generalized): Secondary | ICD-10-CM | POA: Diagnosis present

## 2024-04-24 NOTE — Therapy (Signed)
 OUTPATIENT PHYSICAL THERAPY FEMALE PELVIC TREATMENT   Patient Name: Amy Harrington MRN: [id number] DOB:04/09/1951, 73 y.o., female Today's Date: 04/24/2024  END OF SESSION:  PT End of Session - 04/24/24 1443     Visit Number 6    Number of Visits 10    Date for PT Re-Evaluation 03/27/24    Authorization Type Medicare    PT Start Time 0200    PT Stop Time 0242    PT Time Calculation (min) 42 min    Activity Tolerance Patient tolerated treatment well    Behavior During Therapy [facility] for tasks assessed/performed               Past Medical History:  Diagnosis Date   GERD (gastroesophageal reflux disease)    History of cancer chemotherapy    right breast cancer;   10-04-2019  to  12-20-2019   History of external beam radiation therapy    right breast cancer;   01-17-2020  to  02-12-2020   Malignant neoplasm of 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, estrogen receptor positive (HCC) 07/2019   onologist---  dr catrina  surgeon---  dr aron;  dx 11/ 2020;   09-07-2019 s/p right lumpectomy w/ node dissection;  Stage 1a,G2, IDC ;   completed adjuvant chemo 12-20-2019;  completed IMRT 02-12-2020   OAB (overactive bladder)    Plantar fasciitis    intermittant   PONV (postoperative nausea and vomiting)    Presence of pessary    Prolapse of female pelvic organs    SUI (stress urinary incontinence, female)    Past Surgical History:  Procedure Laterality Date   BLADDER SUSPENSION N/A 01/10/2024   Procedure: CREATION, URETHRAL SLING, RETROPUBIC APPROACH, USING POLYPROPYLENE TAPE;  Surgeon: Marilynne Rosaline SAILOR, MD;  Location: [facility] OR;  Service: Gynecology;  Laterality: N/A;  midrurethral sling   BREAST LUMPECTOMY WITH RADIOACTIVE SEED AND SENTINEL LYMPH NODE BIOPSY Right 09/07/2019   Procedure: RIGHT BREAST LUMPECTOMY WITH RADIOACTIVE SEED AND SENTINEL LYMPH NODE BIOPSY;  Surgeon: aron Shoulders, MD;  Location: [facility];  Service: General;  Laterality: Right;    CATARACT EXTRACTION W/ INTRAOCULAR LENS IMPLANT Bilateral 2021   COLONOSCOPY WITH PROPOFOL   2023   dr schooler   COLPOCLEISIS N/A 01/10/2024   Procedure: COLPOCLEISIS;  Surgeon: Marilynne Rosaline SAILOR, MD;  Location: [facility] OR;  Service: Gynecology;  Laterality: N/A;   CYSTOSCOPY N/A 01/10/2024   Procedure: CYSTOSCOPY;  Surgeon: Marilynne Rosaline SAILOR, MD;  Location: [facility] OR;  Service: Gynecology;  Laterality: N/A;   INGUINAL HERNIA REPAIR Left 1973   PORT-A-CATH REMOVAL N/A 09/16/2020   Procedure: REMOVAL PORT-A-CATH;  Surgeon: aron Shoulders, MD;  Location: [facility];  Service: General;  Laterality: N/A;   PORTACATH PLACEMENT Left 09/07/2019   Procedure: INSERTION PORT-A-CATH WITH ULTRASOUND;  Surgeon: aron Shoulders, MD;  Location: [facility];  Service: General;  Laterality: Left;   RECTOCELE REPAIR N/A 01/10/2024   Procedure: COLPORRHAPHY, POSTERIOR, FOR RECTOCELE REPAIR;  Surgeon: Marilynne Rosaline SAILOR, MD;  Location: [facility] OR;  Service: Gynecology;  Laterality: N/A;  -levator plication with perineorrhaphy   VAGINAL HYSTERECTOMY N/A 01/10/2024   Procedure: HYSTERECTOMY, VAGINAL;  Surgeon: Marilynne Rosaline SAILOR, MD;  Location: [facility] OR;  Service: Gynecology;  Laterality: N/A;   Patient Active Problem List   Diagnosis Date Noted   Tear of gluteus medius tendon 12/09/2022   Malignant neoplasm of 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, estrogen receptor positive (HCC) 08/17/2019   Gastroesophageal reflux  disease 10/10/2017    PCP: Claudene Pellet, MD  REFERRING PROVIDER: Marilynne Rosaline SAILOR, MD   REFERRING DIAG: [phone] (ICD-10-CM) - Uterovaginal prolapse, incomplete N39.3 (ICD-10-CM) - SUI (stress urinary incontinence, female)  THERAPY DIAG:  Muscle weakness (generalized)  Unspecified lack of coordination  Abnormal posture  Rationale for Evaluation and Treatment: Rehabilitation  ONSET DATE: 01/10/24  SUBJECTIVE:                                                                                                                                                                                            SUBJECTIVE STATEMENT: [name]  Patient reports that she is doing well today, she felt some soreness after last visit, but no pain. No urinary concerns or leakage this past week. No other bowel related concerns to report. 0/10 pain today. No heaviness in the vaginal region to report.   From eval: Patient reports to pelvic PT after partial hysterectomy 01/10/24. She has been feeling tired since the surgery. 0/10 pain to report. Some urinary concerns since surgery, including urgency that almost leads to incontinence if she is not able to make it to the bathroom on time. Fluid intake: 3-4 8oz water bottles per day; drinks some diet coke (1-2/day), occasionally tea or lemonade   PAIN:  Are you having pain? No NPRS scale: 0/10  PRECAUTIONS: None  RED FLAGS: None   WEIGHT BEARING RESTRICTIONS: No  FALLS:  Has patient fallen in last 6 months? 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, sitting majority of day   ACTIVITY LEVEL : walks 10k steps per day, housework and yardwork   PLOF: Independent with basic ADLs  PATIENT GOALS: to get stronger overall with functional tasks after hysterectomy   PERTINENT HISTORY:  Partial hysterectomy 01/10/24, hernia repair 20+ years ago  Sexual abuse: No  BOWEL MOVEMENT: Pain with bowel movement: No Type of bowel movement:Type (Bristol Stool Scale) 4, Frequency 1-2x/day, Strain no, and Splinting no Fully empty rectum: Yes:   Leakage: No Pads: No Fiber supplement/laxative sometimes if needed - miralax    URINATION: Pain with urination: No Fully empty bladder: Yes:   Stream: Strong Urgency: Yes  Frequency: within normal limits  Leakage: Urge to void Pads: Yes: in the last month due to leakage   INTERCOURSE:  Ability to have vaginal penetration No  Pain with intercourse:  none DrynessNo  PREGNANCY: Vaginal deliveries 2 Tearing Yes: tearing with first   PROLAPSE: None  OBJECTIVE:  Note: Objective measures were completed at Evaluation unless otherwise noted.  PATIENT SURVEYS:  PFIQ-7: 16  COGNITION: Overall cognitive status: Within functional limits for  tasks assessed     SENSATION: Light touch: Appears intact  LUMBAR SPECIAL TESTS:  Prone instability test: Positive  FUNCTIONAL TESTS:  Squat: lateral weight shift to the right, right LE dynamic knee valgus with loading   GAIT: Assistive device utilized: None Comments: moderate trendelenburg gait pattern with ambulation   POSTURE: rounded shoulders, forward head, and flexed trunk   LUMBARAROM/PROM:  A/PROM A/PROM  eval  Flexion 25% limited  Extension 50% limited  Right lateral flexion 25% limited  Left lateral flexion 25% limited  Right rotation 25% limited  Left rotation 25% limited   (Blank rows = not tested)  LOWER EXTREMITY ROM: within functional limits   Active ROM Right eval Left eval  Hip flexion    Hip extension    Hip abduction    Hip adduction    Hip internal rotation    Hip external rotation    Knee flexion    Knee extension    Ankle dorsiflexion    Ankle plantarflexion    Ankle inversion    Ankle eversion     (Blank rows = not tested)  LOWER EXTREMITY MMT:  MMT Right eval Left eval  Hip flexion 4 4  Hip extension 4 4  Hip abduction 3+ 3+  Hip adduction 3+ 3+  Hip internal rotation    Hip external rotation    Knee flexion 4 4  Knee extension 4 4  Ankle dorsiflexion    Ankle plantarflexion    Ankle inversion    Ankle eversion     (Blank rows = not tested) PALPATION:   General: no significant tenderness to palpation of bilateral adductors or hip flexors   Pelvic Alignment: within normal limits   Abdominal: upper chest breathing and abdominal bracing present at rest with decreased lower rib excursion with inhalation                 External  Perineal Exam: dryness present with sufficient clitoral hood mobility                              Internal Pelvic Floor: Patient was unable to perform a pelvic floor contraction independently in the hooklying position today. With cueing to engage her pelvic floor, she tends to push down and out rather than drawing the musculature up and in, demonstrating a lack of coordination. With cueing to engage her transverse abdominis, patient was able to slightly lift the pelvic floor with exhalation. She is able to actively lengthen her pelvic floor very well. She plans to work on this coordination with this exercise in the hooklying position. Patient demonstrates normal tone throughout superficial and deep pelvic floor musculature bilaterally, and had no pain with today's examination.   Patient confirms identification and approves PT to assess internal pelvic floor and treatment Yes No emotional/communication barriers or cognitive limitation. Patient is motivated to learn. Patient understands and agrees with treatment goals and plan. PT explains patient will be examined in standing, sitting, and lying down to see how their muscles and joints work. When they are ready, they will be asked to remove their underwear so PT can examine their perineum. The patient is also given the option of providing their own chaperone as one is not provided in our facility. The patient also has the right and is explained the right to defer or refuse any part of the evaluation or treatment including the internal exam. With the patient's consent, PT will  use one gloved finger to gently assess the muscles of the pelvic floor, seeing how well it contracts and relaxes and if there is muscle symmetry. After, the patient will get dressed and PT and patient will discuss exam findings and plan of care. PT and patient discuss plan of care, schedule, attendance policy and HEP activities.  PELVIC MMT:   MMT eval  Vaginal Unable to perform pelvic  floor contraction independently.  Internal Anal Sphincter   External Anal Sphincter   Puborectalis   Diastasis Recti   (Blank rows = not tested)       TONE: Within normal limits   PROLAPSE: N/A in hooklying   TODAY'S TREATMENT:                                                                                                                              DATE:   03/27/24: Neuro re-ed: standing diaphragmatic breathing + pelvic floor lengthening with inhalation and shortening with exhalation (cueing to draw bellybutton in as she blows out) 3x10  Hooklying single leg march + diaphragmatic breathing 2x10  Therapeutic exercise: Sit to stand +  diaphragmatic breathing 2x10  Bridge + diaphragmatic breathing 2x10  Lower trunk rotations + diaphragmatic breathing 2x10  Supine single knee to chest stretch + diaphragmatic breathing 2x36min each side  Supine piriformis stretch + diaphragmatic breathing 2x65min  Self care: relative anatomy and the connection between the diaphragm and pelvic floor; how to correctly perform a pelvic floor contraction vs pushing down through the pelvic floor  Blow as you go technique for managing intraabdominal/pelvic floor pressure  Toileting mechanics for complete emptying of stool/urine  Abdominal massage education and training for constipation management   04/12/24: Neuro re-ed: standing diaphragmatic breathing + pelvic floor lengthening with inhalation and shortening with exhalation (cueing to draw bellybutton in as she blows out) 3x10  Hooklying single leg march + GTB around knees + diaphragmatic breathing 2x10  Therapeutic exercise: Sit to stand + hip abduction (GTB) + diaphragmatic breathing 2x10  Bridge + hip abduction (GTB) + diaphragmatic breathing 2x10  Standing hip extension + diaphragmatic breathing 2x10  Lower trunk rotations + diaphragmatic breathing 2x10  Supine single knee to chest stretch + diaphragmatic breathing 2x35min each side  Supine piriformis  stretch + diaphragmatic breathing 2x22min  Self care: relative anatomy and the connection between the diaphragm and pelvic floor; how to correctly perform a pelvic floor contraction vs pushing down through the pelvic floor  Blow as you go technique for managing intraabdominal/pelvic floor pressure  Toileting mechanics for complete emptying of stool/urine  Abdominal massage education and training for constipation management   04/24/24: Neuro re-ed: standing diaphragmatic breathing + pelvic floor lengthening with inhalation and shortening with exhalation (cueing to draw bellybutton in as she blows out) 3x10  Standing march + pull down (GTB) + diaphragmatic breathing 2x10  Standing march + alternating overhead press (2# weights) + diaphragmatic breathing 2x10  Aura carry (10#KB) +  diaphragmatic breathing 2x1000' each side  Therapeutic exercise: Chair touch squat holding 5# weight + diaphragmatic breathing 2x8  Standing hip 3 way with RTB around ankles + diaphragmatic breathing 2x8 Lower trunk rotations + diaphragmatic breathing 2x10  Supine single knee to chest stretch + diaphragmatic breathing 2x54min each side  Supine piriformis stretch + diaphragmatic breathing 2x58min  Self care: relative anatomy and the connection between the diaphragm and pelvic floor; how to correctly perform a pelvic floor contraction vs pushing down through the pelvic floor  Blow as you go technique for managing intraabdominal/pelvic floor pressure  Toileting mechanics for complete emptying of stool/urine  Abdominal massage education and training for constipation management   PATIENT EDUCATION:  Education details: relative anatomy and the connection between the diaphragm and pelvic floor; how to correctly perform a pelvic floor contraction vs pushing down through the pelvic floor  Person educated: Patient Education method: Explanation, Demonstration, Tactile cues, Verbal cues, and Handouts Education comprehension:  verbalized understanding, returned demonstration, verbal cues required, tactile cues required, and needs further education  HOME EXERCISE PROGRAM: Access Code: [email or web address] URL: [email or web address] Date: 04/24/2024 Prepared by: Celena Domino  Exercises - Standing Pelvic Floor Contraction  - 1 x daily - 7 x weekly - 1 sets - 10 reps - Squat with Chair Touch  - 1 x daily - 7 x weekly - 2 sets - 10 reps - Standing March with Alternating Med [facility]  - 1 x daily - 7 x weekly - 2 sets - 12 reps - Standing 3-Way Leg Reach with Resistance at Ankles and Counter Support  - 1 x daily - 7 x weekly - 2 sets - 8 reps - Resistance Pulldown with March  - 1 x daily - 7 x weekly - 2 sets - 10 reps - Supine Lower Trunk Rotation  - 1 x daily - 7 x weekly - 2 sets - 10 reps - Supine Single Knee to Chest Stretch  - 1 x daily - 7 x weekly - 2 sets - hold - Supine Figure 4 Piriformis Stretch  - 1 x daily - 7 x weekly - 2 sets - hold  ASSESSMENT:  CLINICAL IMPRESSION: Patient is a 73 y.o. female  who was seen today for physical therapy treatment for post-hysterectomy care, urinary urgency, and strengthening of the musculature s/p partial hysterectomy 01/10/24. 0/10 pain upon arrival with no urinary or bowel concerns to report. She felt great after last session, therefore all exercises were progressed today in intensity with addition of theraband/resistance/gravitational load. We modified her HEP accordingly and she had no increase in right hip pain during today's session. Overall, patient tolerated session very well and Pt would benefit from additional PT to further address deficits.    OBJECTIVE IMPAIRMENTS: decreased coordination, decreased endurance, decreased mobility, decreased ROM, and decreased strength.   ACTIVITY LIMITATIONS: continence  PARTICIPATION LIMITATIONS: cleaning, laundry, driving, community activity, occupation, and yard work  PERSONAL FACTORS: Age, Past/current  experiences, and Time since onset of injury/illness/exacerbation are also affecting patient's functional outcome.   REHAB POTENTIAL: Excellent  CLINICAL DECISION MAKING: Stable/uncomplicated  EVALUATION COMPLEXITY: Low   GOALS: Goals reviewed with patient? Yes  SHORT TERM GOALS: Target date: 03/27/2024  Pt will be independent with HEP.  Baseline: Goal status: INITIAL  2.  Pt will be independent with diaphragmatic breathing and down training activities in order to improve pelvic floor relaxation.  Baseline:  Goal status: INITIAL  3.  Pt will be independent with the knack, urge  suppression technique, and double voiding in order to improve bladder habits and decrease urinary incontinence.   Baseline:  Goal status: INITIAL  LONG TERM GOALS: Target date: 08/29/2024  Pt will be independent with advanced HEP.  Baseline:  Goal status: INITIAL  2.  Pt will be able to correctly perform diaphragmatic breathing and appropriate pressure management in order to prevent worsening vaginal wall laxity and improve pelvic floor A/ROM.  Baseline:  Goal status: INITIAL  3.  Pt to demonstrate improved coordination of pelvic floor and breathing mechanics with 10# squat with appropriate synergistic patterns to decrease pain and leakage at least 75% of the time for improved ability to complete a 30 minute workout with strain at pelvic floor and symptoms.   Baseline:  Goal status: INITIAL  4.  Pt will demonstrate normal pelvic floor muscle tone and A/ROM, able to achieve 4/5 strength with contractions and 10 sec endurance, in order to provide appropriate lumbopelvic support in functional activities.   Baseline:  Goal status: INITIAL  PLAN:  PT FREQUENCY: 1-2x/week  PT DURATION: 12 weeks  PLANNED INTERVENTIONS: 97110-Therapeutic exercises, 97530- Therapeutic activity, 97112- Neuromuscular re-education, 97535- Self Care, [id number]- Manual therapy, [phone]- Aquatic Therapy, Patient/Family education,  Taping, Joint mobilization, Spinal mobilization, Scar mobilization, Cryotherapy, and Moist heat  PLAN FOR NEXT SESSION: continued internal vaginal treatment to assess hooklying pelvic floor AROM, introduce hip and core strengthening  Celena JAYSON Domino, PT 04/24/2024, 2:43 PM

## 2024-05-01 ENCOUNTER — Ambulatory Visit: Admitting: Physical Therapy

## 2024-05-01 DIAGNOSIS — M6281 Muscle weakness (generalized): Secondary | ICD-10-CM

## 2024-05-01 DIAGNOSIS — R293 Abnormal posture: Secondary | ICD-10-CM

## 2024-05-01 DIAGNOSIS — R279 Unspecified lack of coordination: Secondary | ICD-10-CM

## 2024-05-01 DIAGNOSIS — M25551 Pain in right hip: Secondary | ICD-10-CM

## 2024-05-01 NOTE — Therapy (Signed)
 OUTPATIENT PHYSICAL THERAPY FEMALE PELVIC TREATMENT   Patient Name: Amy Harrington MRN: [id number] DOB:12-16-1950, 73 y.o., female Today's Date: 05/01/2024  END OF SESSION:  PT End of Session - 05/01/24 1009     Visit Number 7    Number of Visits 10    Date for PT Re-Evaluation 03/27/24    Authorization Type Medicare    PT Start Time 0930    PT Stop Time 1010    PT Time Calculation (min) 40 min    Activity Tolerance Patient tolerated treatment well    Behavior During Therapy [facility] for tasks assessed/performed                Past Medical History:  Diagnosis Date   GERD (gastroesophageal reflux disease)    History of cancer chemotherapy    right breast cancer;   10-04-2019  to  12-20-2019   History of external beam radiation therapy    right breast cancer;   01-17-2020  to  02-12-2020   Malignant neoplasm of 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, estrogen receptor positive (HCC) 07/2019   onologist---  dr catrina  surgeon---  dr aron;  dx 11/ 2020;   09-07-2019 s/p right lumpectomy w/ node dissection;  Stage 1a,G2, IDC ;   completed adjuvant chemo 12-20-2019;  completed IMRT 02-12-2020   OAB (overactive bladder)    Plantar fasciitis    intermittant   PONV (postoperative nausea and vomiting)    Presence of pessary    Prolapse of female pelvic organs    SUI (stress urinary incontinence, female)    Past Surgical History:  Procedure Laterality Date   BLADDER SUSPENSION N/A 01/10/2024   Procedure: CREATION, URETHRAL SLING, RETROPUBIC APPROACH, USING POLYPROPYLENE TAPE;  Surgeon: Marilynne Rosaline SAILOR, MD;  Location: [facility] OR;  Service: Gynecology;  Laterality: N/A;  midrurethral sling   BREAST LUMPECTOMY WITH RADIOACTIVE SEED AND SENTINEL LYMPH NODE BIOPSY Right 09/07/2019   Procedure: RIGHT BREAST LUMPECTOMY WITH RADIOACTIVE SEED AND SENTINEL LYMPH NODE BIOPSY;  Surgeon: aron Shoulders, MD;  Location: [facility];  Service: General;  Laterality: Right;    CATARACT EXTRACTION W/ INTRAOCULAR LENS IMPLANT Bilateral 2021   COLONOSCOPY WITH PROPOFOL   2023   dr schooler   COLPOCLEISIS N/A 01/10/2024   Procedure: COLPOCLEISIS;  Surgeon: Marilynne Rosaline SAILOR, MD;  Location: [facility] OR;  Service: Gynecology;  Laterality: N/A;   CYSTOSCOPY N/A 01/10/2024   Procedure: CYSTOSCOPY;  Surgeon: Marilynne Rosaline SAILOR, MD;  Location: [facility] OR;  Service: Gynecology;  Laterality: N/A;   INGUINAL HERNIA REPAIR Left 1973   PORT-A-CATH REMOVAL N/A 09/16/2020   Procedure: REMOVAL PORT-A-CATH;  Surgeon: aron Shoulders, MD;  Location: [facility];  Service: General;  Laterality: N/A;   PORTACATH PLACEMENT Left 09/07/2019   Procedure: INSERTION PORT-A-CATH WITH ULTRASOUND;  Surgeon: aron Shoulders, MD;  Location:  SURGERY CENTER;  Service: General;  Laterality: Left;   RECTOCELE REPAIR N/A 01/10/2024   Procedure: COLPORRHAPHY, POSTERIOR, FOR RECTOCELE REPAIR;  Surgeon: Marilynne Rosaline SAILOR, MD;  Location: [facility] OR;  Service: Gynecology;  Laterality: N/A;  -levator plication with perineorrhaphy   VAGINAL HYSTERECTOMY N/A 01/10/2024   Procedure: HYSTERECTOMY, VAGINAL;  Surgeon: Marilynne Rosaline SAILOR, MD;  Location: [facility] OR;  Service: Gynecology;  Laterality: N/A;   Patient Active Problem List   Diagnosis Date Noted   Tear of gluteus medius tendon 12/09/2022   Malignant neoplasm of 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, estrogen receptor positive (HCC) 08/17/2019   Gastroesophageal  reflux disease 10/10/2017    PCP: Claudene Pellet, MD  REFERRING PROVIDER: Marilynne Rosaline SAILOR, MD   REFERRING DIAG: [phone] (ICD-10-CM) - Uterovaginal prolapse, incomplete N39.3 (ICD-10-CM) - SUI (stress urinary incontinence, female)  THERAPY DIAG:  Muscle weakness (generalized)  Unspecified lack of coordination  Abnormal posture  Pain in right hip  Rationale for Evaluation and Treatment: Rehabilitation  ONSET DATE: 01/10/24  SUBJECTIVE:                                                                                                                                                                                            SUBJECTIVE STATEMENT: [name]  Patient reports that she had some pain in her right hip after last session. She described this as muscle soreness. No vaginal heaviness to report. No urinary incontinence recently. 0/10 pain today. She can tell a difference in her pelvic floor symptoms since starting pelvic PT. She noticed this past weekend she was able to get up off the floor without significant difficulty which she is pleased with.   From eval: Patient reports to pelvic PT after partial hysterectomy 01/10/24. She has been feeling tired since the surgery. 0/10 pain to report. Some urinary concerns since surgery, including urgency that almost leads to incontinence if she is not able to make it to the bathroom on time. Fluid intake: 3-4 8oz water bottles per day; drinks some diet coke (1-2/day), occasionally tea or lemonade   PAIN:  Are you having pain? No NPRS scale: 0/10  PRECAUTIONS: None  RED FLAGS: None   WEIGHT BEARING RESTRICTIONS: No  FALLS:  Has patient fallen in last 6 months? 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, sitting majority of day   ACTIVITY LEVEL : walks 10k steps per day, housework and yardwork   PLOF: Independent with basic ADLs  PATIENT GOALS: to get stronger overall with functional tasks after hysterectomy   PERTINENT HISTORY:  Partial hysterectomy 01/10/24, hernia repair 20+ years ago  Sexual abuse: No  BOWEL MOVEMENT: Pain with bowel movement: No Type of bowel movement:Type (Bristol Stool Scale) 4, Frequency 1-2x/day, Strain no, and Splinting no Fully empty rectum: Yes:   Leakage: No Pads: No Fiber supplement/laxative sometimes if needed - miralax    URINATION: Pain with urination: No Fully empty bladder: Yes:   Stream: Strong Urgency: Yes  Frequency: within  normal limits  Leakage: Urge to void Pads: Yes: in the last month due to leakage   INTERCOURSE:  Ability to have vaginal penetration No  Pain with intercourse: none DrynessNo  PREGNANCY: Vaginal deliveries 2 Tearing Yes: tearing with first  PROLAPSE: None  OBJECTIVE:  Note: Objective measures were completed at Evaluation unless otherwise noted.  PATIENT SURVEYS:  PFIQ-7: 16  COGNITION: Overall cognitive status: Within functional limits for tasks assessed     SENSATION: Light touch: Appears intact  LUMBAR SPECIAL TESTS:  Prone instability test: Positive  FUNCTIONAL TESTS:  Squat: lateral weight shift to the right, right LE dynamic knee valgus with loading   GAIT: Assistive device utilized: None Comments: moderate trendelenburg gait pattern with ambulation   POSTURE: rounded shoulders, forward head, and flexed trunk   LUMBARAROM/PROM:  A/PROM A/PROM  eval  Flexion 25% limited  Extension 50% limited  Right lateral flexion 25% limited  Left lateral flexion 25% limited  Right rotation 25% limited  Left rotation 25% limited   (Blank rows = not tested)  LOWER EXTREMITY ROM: within functional limits   Active ROM Right eval Left eval  Hip flexion    Hip extension    Hip abduction    Hip adduction    Hip internal rotation    Hip external rotation    Knee flexion    Knee extension    Ankle dorsiflexion    Ankle plantarflexion    Ankle inversion    Ankle eversion     (Blank rows = not tested)  LOWER EXTREMITY MMT:  MMT Right eval Left eval  Hip flexion 4 4  Hip extension 4 4  Hip abduction 3+ 3+  Hip adduction 3+ 3+  Hip internal rotation    Hip external rotation    Knee flexion 4 4  Knee extension 4 4  Ankle dorsiflexion    Ankle plantarflexion    Ankle inversion    Ankle eversion     (Blank rows = not tested) PALPATION:   General: no significant tenderness to palpation of bilateral adductors or hip flexors   Pelvic Alignment: within  normal limits   Abdominal: upper chest breathing and abdominal bracing present at rest with decreased lower rib excursion with inhalation                 External Perineal Exam: dryness present with sufficient clitoral hood mobility                              Internal Pelvic Floor: Patient was unable to perform a pelvic floor contraction independently in the hooklying position today. With cueing to engage her pelvic floor, she tends to push down and out rather than drawing the musculature up and in, demonstrating a lack of coordination. With cueing to engage her transverse abdominis, patient was able to slightly lift the pelvic floor with exhalation. She is able to actively lengthen her pelvic floor very well. She plans to work on this coordination with this exercise in the hooklying position. Patient demonstrates normal tone throughout superficial and deep pelvic floor musculature bilaterally, and had no pain with today's examination.   Patient confirms identification and approves PT to assess internal pelvic floor and treatment Yes No emotional/communication barriers or cognitive limitation. Patient is motivated to learn. Patient understands and agrees with treatment goals and plan. PT explains patient will be examined in standing, sitting, and lying down to see how their muscles and joints work. When they are ready, they will be asked to remove their underwear so PT can examine their perineum. The patient is also given the option of providing their own chaperone as one is not provided in our facility. The patient  also has the right and is explained the right to defer or refuse any part of the evaluation or treatment including the internal exam. With the patient's consent, PT will use one gloved finger to gently assess the muscles of the pelvic floor, seeing how well it contracts and relaxes and if there is muscle symmetry. After, the patient will get dressed and PT and patient will discuss exam  findings and plan of care. PT and patient discuss plan of care, schedule, attendance policy and HEP activities.  PELVIC MMT:   MMT eval  Vaginal Unable to perform pelvic floor contraction independently.  Internal Anal Sphincter   External Anal Sphincter   Puborectalis   Diastasis Recti   (Blank rows = not tested)       TONE: Within normal limits   PROLAPSE: N/A in hooklying   TODAY'S TREATMENT:                                                                                                                              DATE:   04/12/24: Neuro re-ed: standing diaphragmatic breathing + pelvic floor lengthening with inhalation and shortening with exhalation (cueing to draw bellybutton in as she blows out) 3x10  Hooklying single leg march + GTB around knees + diaphragmatic breathing 2x10  Therapeutic exercise: Sit to stand + hip abduction (GTB) + diaphragmatic breathing 2x10  Bridge + hip abduction (GTB) + diaphragmatic breathing 2x10  Standing hip extension + diaphragmatic breathing 2x10  Lower trunk rotations + diaphragmatic breathing 2x10  Supine single knee to chest stretch + diaphragmatic breathing 2x69min each side  Supine piriformis stretch + diaphragmatic breathing 2x27min  Self care: relative anatomy and the connection between the diaphragm and pelvic floor; how to correctly perform a pelvic floor contraction vs pushing down through the pelvic floor  Blow as you go technique for managing intraabdominal/pelvic floor pressure  Toileting mechanics for complete emptying of stool/urine  Abdominal massage education and training for constipation management   04/24/24: Neuro re-ed: standing diaphragmatic breathing + pelvic floor lengthening with inhalation and shortening with exhalation (cueing to draw bellybutton in as she blows out) 3x10  Standing march + pull down (GTB) + diaphragmatic breathing 2x10  Standing march + alternating overhead press (2# weights) + diaphragmatic breathing  2x10  Aura carry (10#KB) + diaphragmatic breathing 2x1000' each side  Therapeutic exercise: Chair touch squat holding 5# weight + diaphragmatic breathing 2x8  Standing hip 3 way with RTB around ankles + diaphragmatic breathing 2x8 Lower trunk rotations + diaphragmatic breathing 2x10  Supine single knee to chest stretch + diaphragmatic breathing 2x48min each side  Supine piriformis stretch + diaphragmatic breathing 2x24min  Self care: relative anatomy and the connection between the diaphragm and pelvic floor; how to correctly perform a pelvic floor contraction vs pushing down through the pelvic floor  Blow as you go technique for managing intraabdominal/pelvic floor pressure  Toileting mechanics for complete emptying of stool/urine  Abdominal massage  education and training for constipation management   05/01/24: Neuro re-ed: standing diaphragmatic breathing + pelvic floor lengthening with inhalation and shortening with exhalation (cueing to draw bellybutton in as she blows out) 3x10  Standing march + pull down (GTB) + diaphragmatic breathing 2x10  Standing march + alternating overhead press (2# weights) + diaphragmatic breathing 2x10  Farmer carry (10#KB) + diaphragmatic breathing 2x1000' each side  Therapeutic exercise: Nustep level 1 - 5 minutes - PT present to discuss current status  Squat holding 8# dumbbell + diaphragmatic breathing 2x10  Standing hip 3 way with 2# ankle weights + diaphragmatic breathing 2x8 Lower trunk rotations + diaphragmatic breathing 2x10  Supine single knee to chest stretch + diaphragmatic breathing 2x61min each side  Supine piriformis stretch + diaphragmatic breathing 2x82min  Self care: relative anatomy and the connection between the diaphragm and pelvic floor; how to correctly perform a pelvic floor contraction vs pushing down through the pelvic floor  Blow as you go technique for managing intraabdominal/pelvic floor pressure  Toileting mechanics for complete  emptying of stool/urine  Abdominal massage education and training for constipation management   PATIENT EDUCATION:  Education details: relative anatomy and the connection between the diaphragm and pelvic floor; how to correctly perform a pelvic floor contraction vs pushing down through the pelvic floor  Person educated: Patient Education method: Explanation, Demonstration, Tactile cues, Verbal cues, and Handouts Education comprehension: verbalized understanding, returned demonstration, verbal cues required, tactile cues required, and needs further education  HOME EXERCISE PROGRAM: Access Code: [email or web address] URL: [email or web address] Date: 04/24/2024 Prepared by: Celena Domino  Exercises - Standing Pelvic Floor Contraction  - 1 x daily - 7 x weekly - 1 sets - 10 reps - Squat with Chair Touch  - 1 x daily - 7 x weekly - 2 sets - 10 reps - Standing March with Alternating Med [facility]  - 1 x daily - 7 x weekly - 2 sets - 12 reps - Standing 3-Way Leg Reach with Resistance at Ankles and Counter Support  - 1 x daily - 7 x weekly - 2 sets - 8 reps - Resistance Pulldown with March  - 1 x daily - 7 x weekly - 2 sets - 10 reps - Supine Lower Trunk Rotation  - 1 x daily - 7 x weekly - 2 sets - 10 reps - Supine Single Knee to Chest Stretch  - 1 x daily - 7 x weekly - 2 sets - hold - Supine Figure 4 Piriformis Stretch  - 1 x daily - 7 x weekly - 2 sets - hold  ASSESSMENT:  CLINICAL IMPRESSION: Patient is a 73 y.o. female  who was seen today for physical therapy treatment for post-hysterectomy care, urinary urgency, and strengthening of the musculature s/p partial hysterectomy 01/10/24. 0/10 pain upon arrival with no urinary or bowel concerns to report. She felt great after last session, therefore all exercises were progressed today in intensity with addition of theraband/resistance/gravitational load. We progressed her sit to stand exercise to a goblet squat today which she tolerated  very well. We modified her HEP accordingly and she had no increase in right hip pain during today's session. Overall, patient tolerated session very well and Pt would benefit from additional PT to further address deficits.    OBJECTIVE IMPAIRMENTS: decreased coordination, decreased endurance, decreased mobility, decreased ROM, and decreased strength.   ACTIVITY LIMITATIONS: continence  PARTICIPATION LIMITATIONS: cleaning, laundry, driving, community activity, occupation, and yard work  PERSONAL FACTORS: Age, Past/current  experiences, and Time since onset of injury/illness/exacerbation are also affecting patient's functional outcome.   REHAB POTENTIAL: Excellent  CLINICAL DECISION MAKING: Stable/uncomplicated  EVALUATION COMPLEXITY: Low   GOALS: Goals reviewed with patient? Yes  SHORT TERM GOALS: Target date: 03/27/2024  Pt will be independent with HEP.  Baseline: Goal status: INITIAL  2.  Pt will be independent with diaphragmatic breathing and down training activities in order to improve pelvic floor relaxation.  Baseline:  Goal status: INITIAL  3.  Pt will be independent with the knack, urge suppression technique, and double voiding in order to improve bladder habits and decrease urinary incontinence.   Baseline:  Goal status: INITIAL  LONG TERM GOALS: Target date: 08/29/2024  Pt will be independent with advanced HEP.  Baseline:  Goal status: INITIAL  2.  Pt will be able to correctly perform diaphragmatic breathing and appropriate pressure management in order to prevent worsening vaginal wall laxity and improve pelvic floor A/ROM.  Baseline:  Goal status: INITIAL  3.  Pt to demonstrate improved coordination of pelvic floor and breathing mechanics with 10# squat with appropriate synergistic patterns to decrease pain and leakage at least 75% of the time for improved ability to complete a 30 minute workout with strain at pelvic floor and symptoms.   Baseline:  Goal status:  INITIAL  4.  Pt will demonstrate normal pelvic floor muscle tone and A/ROM, able to achieve 4/5 strength with contractions and 10 sec endurance, in order to provide appropriate lumbopelvic support in functional activities.   Baseline:  Goal status: INITIAL  PLAN:  PT FREQUENCY: 1-2x/week  PT DURATION: 12 weeks  PLANNED INTERVENTIONS: 97110-Therapeutic exercises, 97530- Therapeutic activity, 97112- Neuromuscular re-education, 97535- Self Care, [id number]- Manual therapy, [phone]- Aquatic Therapy, Patient/Family education, Taping, Joint mobilization, Spinal mobilization, Scar mobilization, Cryotherapy, and Moist heat  PLAN FOR NEXT SESSION: discharge day!  Celena JAYSON Domino, PT 05/01/2024, 10:09 AM

## 2024-05-07 ENCOUNTER — Ambulatory Visit: Admitting: Physical Therapy

## 2024-05-08 ENCOUNTER — Encounter: Admitting: Physical Therapy

## 2024-05-15 ENCOUNTER — Ambulatory Visit: Admitting: Physical Therapy
